# Patient Record
Sex: Female | Born: 1945
Health system: Southern US, Community
[De-identification: ages and names within clinical notes are randomized; demographics above are authoritative.]

## PROBLEM LIST (undated history)

## (undated) DIAGNOSIS — F329 Major depressive disorder, single episode, unspecified: Secondary | ICD-10-CM

## (undated) DIAGNOSIS — B029 Zoster without complications: Secondary | ICD-10-CM

## (undated) DIAGNOSIS — D649 Anemia, unspecified: Secondary | ICD-10-CM

## (undated) DIAGNOSIS — F32A Depression, unspecified: Secondary | ICD-10-CM

## (undated) HISTORY — DX: Anemia, unspecified: D64.9

## (undated) HISTORY — DX: Depression, unspecified: F32.A

## (undated) HISTORY — PX: TONSILLECTOMY: SUR1361

## (undated) HISTORY — DX: Major depressive disorder, single episode, unspecified: F32.9

## (undated) HISTORY — PX: ABDOMINAL HYSTERECTOMY: SHX81

---

## 2000-02-27 ENCOUNTER — Other Ambulatory Visit: Admission: RE | Admit: 2000-02-27 | Discharge: 2000-02-27 | Payer: Self-pay | Admitting: *Deleted

## 2006-07-03 ENCOUNTER — Encounter: Admission: RE | Admit: 2006-07-03 | Discharge: 2006-07-03 | Payer: Self-pay | Admitting: Geriatric Medicine

## 2006-07-03 IMAGING — RF DG UGI W/ HIGH DENSITY W/KUB
11 series · 11 of 11 positions shown · non-contrast
Comparison: none

CLINICAL DATA: Iron deficiency anemia.  
 UPPER GI HIGH DENSITY WITH KUB:
 Preliminary scout film reveals unremarkable bowel gas pattern.  There is a generous amount of stool in the colon.  Small hiatal hernia.  Marked GE reflux.  Normal esophageal motility.  Somewhat prominent diffuse area gastrica pattern of the stomach is likely within normal limits.  No mucosal fold thickening, ulcer, or mass.  Normal duodenum.

[Series 1: run · 1 of 1 slices shown (1 of 10)]
[im 1/1]
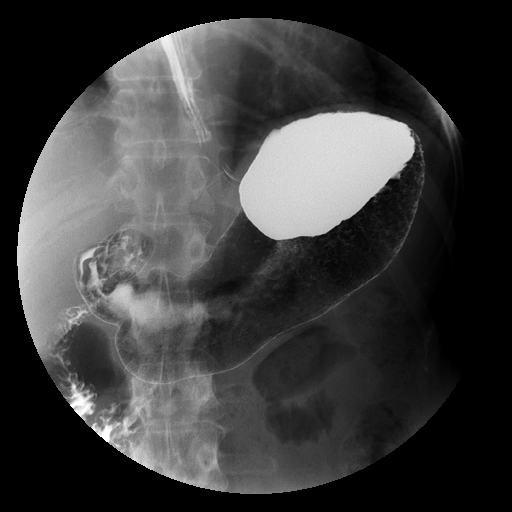

[Series 2: run · 1 of 1 slices shown (2 of 10)]
[im 1/1]
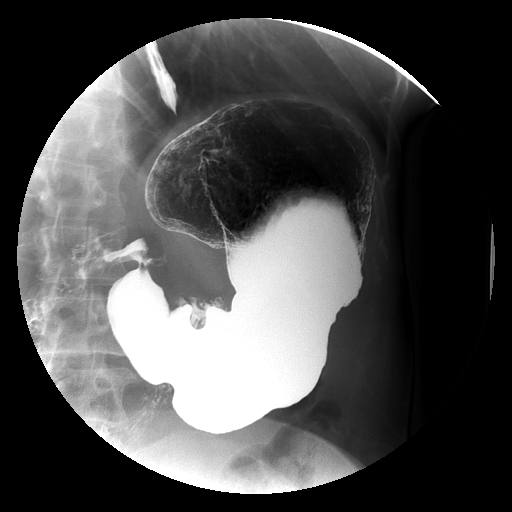

[Series 3: run · 1 of 1 slices shown (3 of 10)]
[im 1/1]
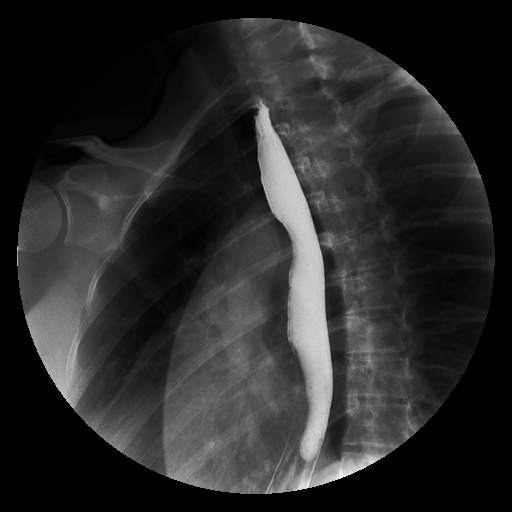

[Series 5: run · 1 of 1 slices shown (4 of 10)]
[im 1/1]
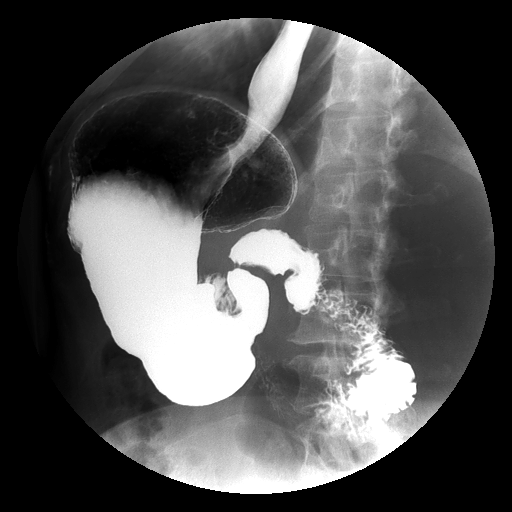

[Series 7: run · 1 of 1 slices shown (5 of 10)]
[im 1/1]
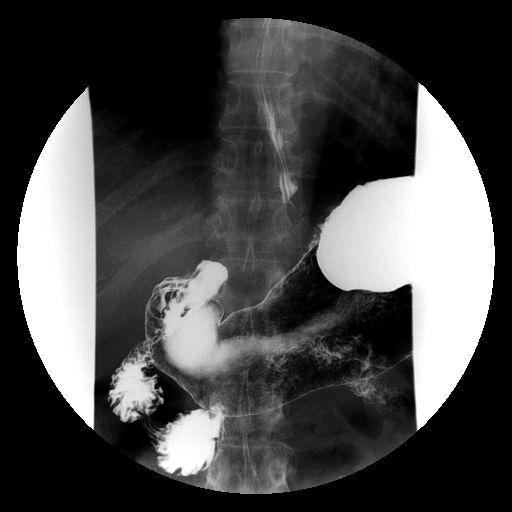

[Series 8: run · 1 of 1 slices shown (6 of 10)]
[im 1/1]
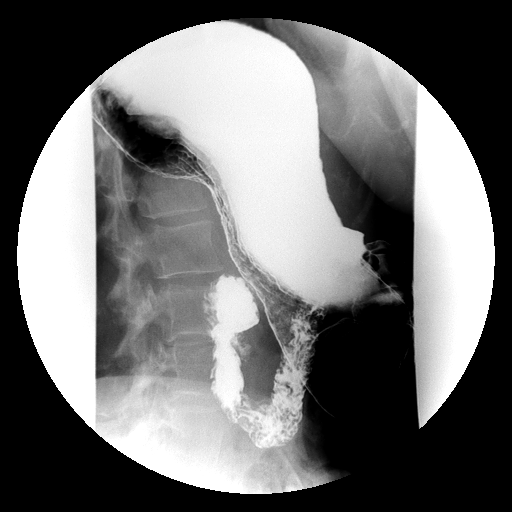

[Series 9: run · 1 of 1 slices shown (7 of 10)]
[im 1/1]
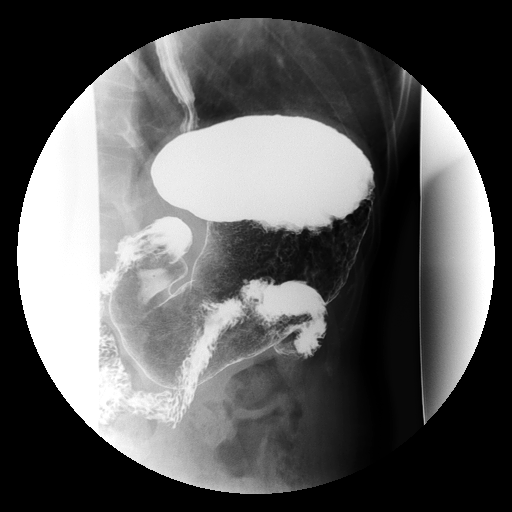

[Series 10: run · 1 of 1 slices shown (8 of 10)]
[im 1/1]
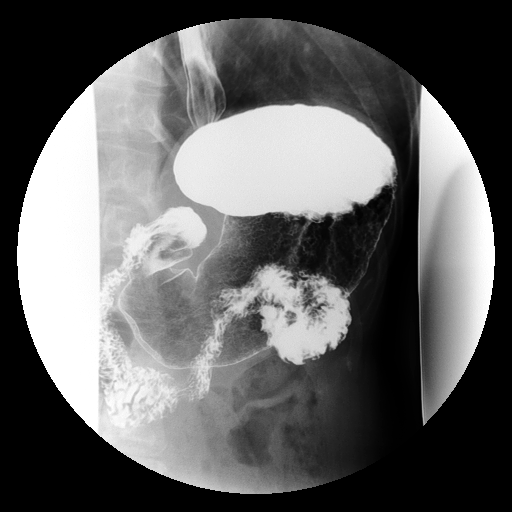

[Series 11: run · 1 of 1 slices shown (9 of 10)]
[im 1/1]
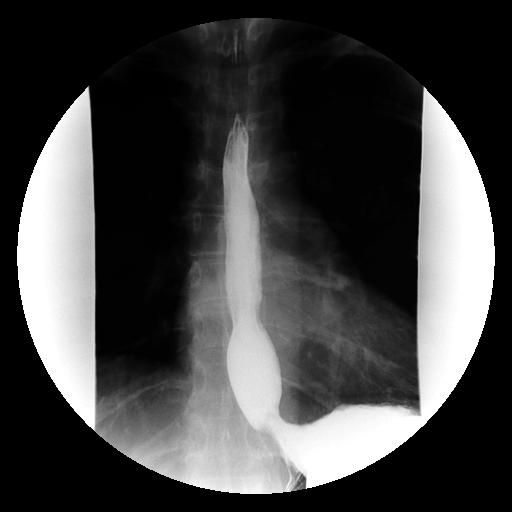

[Series 12: run · 1 of 1 slices shown (10 of 10)]
[im 1/1]
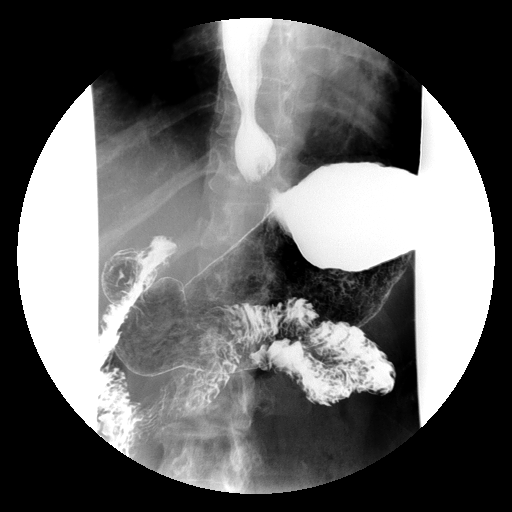

[Series 1001: view not recorded · 0.20mm/px · 1 of 1 slices shown]
[im 1/1]
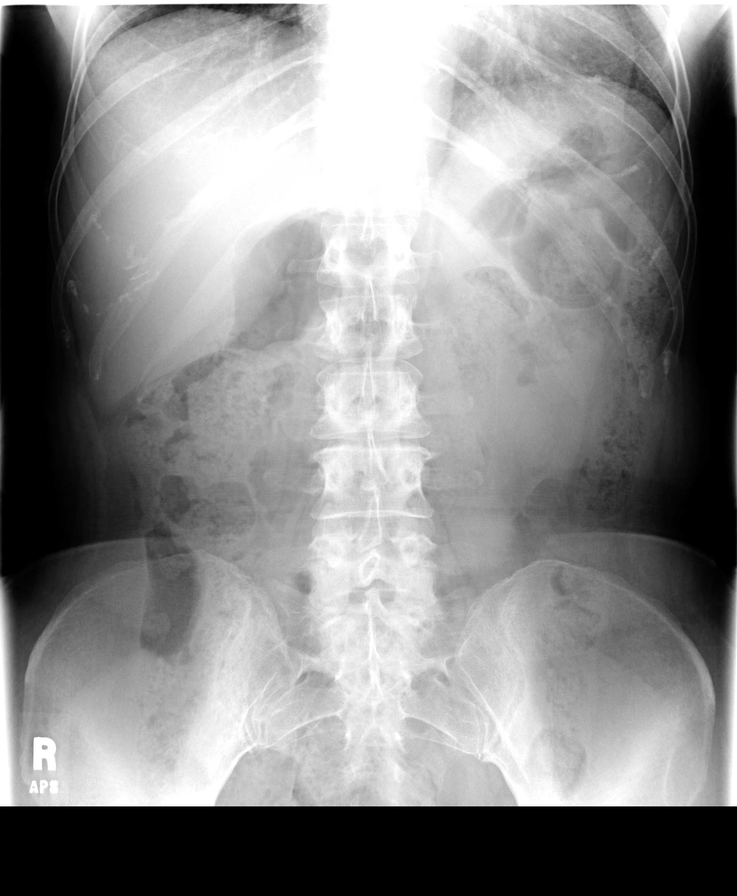

[11 of 11 positions shown; findings below may reference images not displayed]

IMPRESSION: Small hiatal hernia and marked GE reflux.  I discussed the findings with the patient.

## 2011-05-02 DIAGNOSIS — H5710 Ocular pain, unspecified eye: Secondary | ICD-10-CM | POA: Diagnosis not present

## 2011-05-02 DIAGNOSIS — H04129 Dry eye syndrome of unspecified lacrimal gland: Secondary | ICD-10-CM | POA: Diagnosis not present

## 2011-05-26 DIAGNOSIS — F329 Major depressive disorder, single episode, unspecified: Secondary | ICD-10-CM | POA: Diagnosis not present

## 2011-05-26 DIAGNOSIS — Z0000 Encounter for general adult medical examination without abnormal findings: Secondary | ICD-10-CM | POA: Diagnosis not present

## 2011-05-26 DIAGNOSIS — D509 Iron deficiency anemia, unspecified: Secondary | ICD-10-CM | POA: Diagnosis not present

## 2011-05-26 DIAGNOSIS — Z79899 Other long term (current) drug therapy: Secondary | ICD-10-CM | POA: Diagnosis not present

## 2011-05-26 DIAGNOSIS — J309 Allergic rhinitis, unspecified: Secondary | ICD-10-CM | POA: Diagnosis not present

## 2011-05-26 DIAGNOSIS — Z23 Encounter for immunization: Secondary | ICD-10-CM | POA: Diagnosis not present

## 2011-05-27 DIAGNOSIS — Z79899 Other long term (current) drug therapy: Secondary | ICD-10-CM | POA: Diagnosis not present

## 2011-05-27 DIAGNOSIS — D509 Iron deficiency anemia, unspecified: Secondary | ICD-10-CM | POA: Diagnosis not present

## 2011-06-02 DIAGNOSIS — L821 Other seborrheic keratosis: Secondary | ICD-10-CM | POA: Diagnosis not present

## 2011-06-02 DIAGNOSIS — D239 Other benign neoplasm of skin, unspecified: Secondary | ICD-10-CM | POA: Diagnosis not present

## 2011-06-02 DIAGNOSIS — D485 Neoplasm of uncertain behavior of skin: Secondary | ICD-10-CM | POA: Diagnosis not present

## 2011-06-24 DIAGNOSIS — D485 Neoplasm of uncertain behavior of skin: Secondary | ICD-10-CM | POA: Diagnosis not present

## 2011-11-24 DIAGNOSIS — F329 Major depressive disorder, single episode, unspecified: Secondary | ICD-10-CM | POA: Diagnosis not present

## 2011-11-24 DIAGNOSIS — D509 Iron deficiency anemia, unspecified: Secondary | ICD-10-CM | POA: Diagnosis not present

## 2011-11-24 DIAGNOSIS — Z79899 Other long term (current) drug therapy: Secondary | ICD-10-CM | POA: Diagnosis not present

## 2012-01-05 DIAGNOSIS — Z1231 Encounter for screening mammogram for malignant neoplasm of breast: Secondary | ICD-10-CM | POA: Diagnosis not present

## 2012-01-06 DIAGNOSIS — M549 Dorsalgia, unspecified: Secondary | ICD-10-CM | POA: Diagnosis not present

## 2012-01-06 DIAGNOSIS — M25559 Pain in unspecified hip: Secondary | ICD-10-CM | POA: Diagnosis not present

## 2012-01-07 DIAGNOSIS — N6489 Other specified disorders of breast: Secondary | ICD-10-CM | POA: Diagnosis not present

## 2012-01-13 DIAGNOSIS — D1801 Hemangioma of skin and subcutaneous tissue: Secondary | ICD-10-CM | POA: Diagnosis not present

## 2012-01-13 DIAGNOSIS — D2370 Other benign neoplasm of skin of unspecified lower limb, including hip: Secondary | ICD-10-CM | POA: Diagnosis not present

## 2012-01-13 DIAGNOSIS — D235 Other benign neoplasm of skin of trunk: Secondary | ICD-10-CM | POA: Diagnosis not present

## 2012-01-13 DIAGNOSIS — Z85828 Personal history of other malignant neoplasm of skin: Secondary | ICD-10-CM | POA: Diagnosis not present

## 2012-01-15 DIAGNOSIS — Z23 Encounter for immunization: Secondary | ICD-10-CM | POA: Diagnosis not present

## 2012-01-27 ENCOUNTER — Other Ambulatory Visit: Payer: Self-pay | Admitting: Geriatric Medicine

## 2012-01-27 DIAGNOSIS — R51 Headache: Secondary | ICD-10-CM | POA: Diagnosis not present

## 2012-01-28 ENCOUNTER — Other Ambulatory Visit: Payer: Self-pay

## 2012-01-28 ENCOUNTER — Ambulatory Visit
Admission: RE | Admit: 2012-01-28 | Discharge: 2012-01-28 | Disposition: A | Discharge status: Home / Self Care | Payer: Medicare Other | Source: Ambulatory Visit | Attending: Geriatric Medicine | Admitting: Geriatric Medicine

## 2012-01-28 DIAGNOSIS — R51 Headache: Secondary | ICD-10-CM | POA: Diagnosis not present

## 2012-01-28 DIAGNOSIS — R42 Dizziness and giddiness: Secondary | ICD-10-CM | POA: Diagnosis not present

## 2012-01-28 IMAGING — CT CT HEAD W/O CM
2 series · 16 of 30 positions shown, 18 images · non-contrast
Comparison: None

CLINICAL DATA: Headache, nausea, dizziness.  Fall 6 weeks ago.

CT HEAD WITHOUT CONTRAST
TECHNIQUE: Contiguous axial images were obtained from the base of
the skull through the vertex without contrast.

[Series 2: head w/o · axial · non-contrast · 0.49mm/px · z∈[+60,+171]mm · 8 of 28 slices shown, 10 images]
[im 4/28  brain]
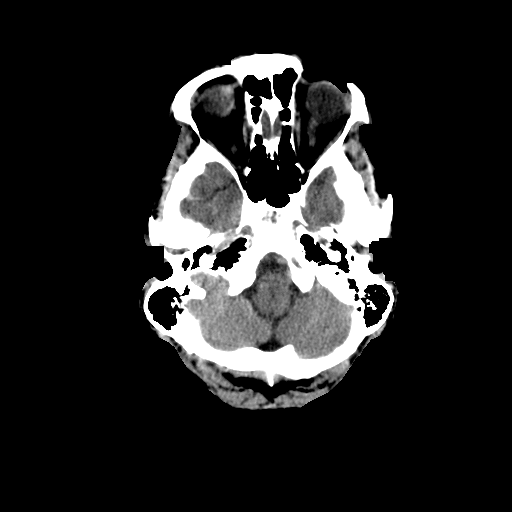
[im 4/28  bone]
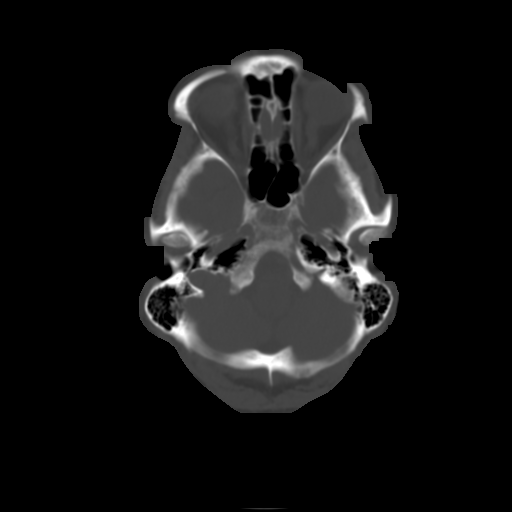
[im 7/28  brain]
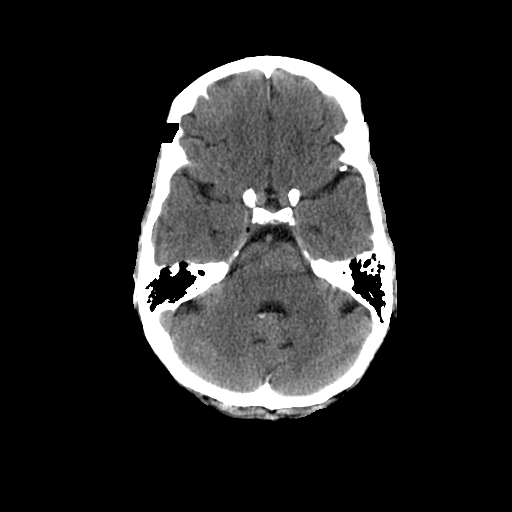
[im 10/28  brain]
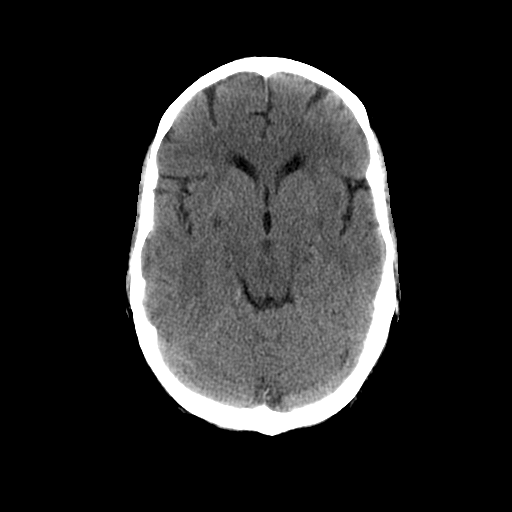
[im 13/28  brain]
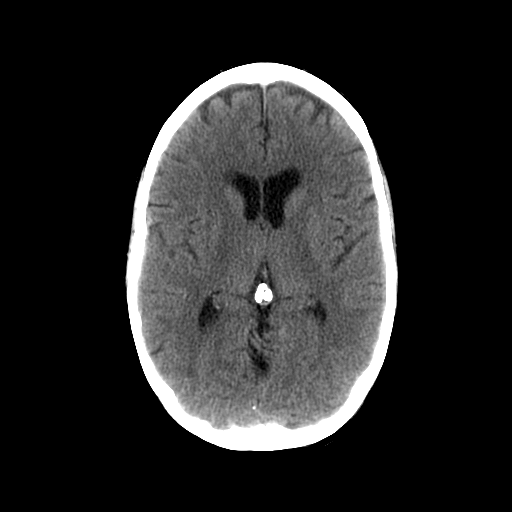
[im 16/28  brain]
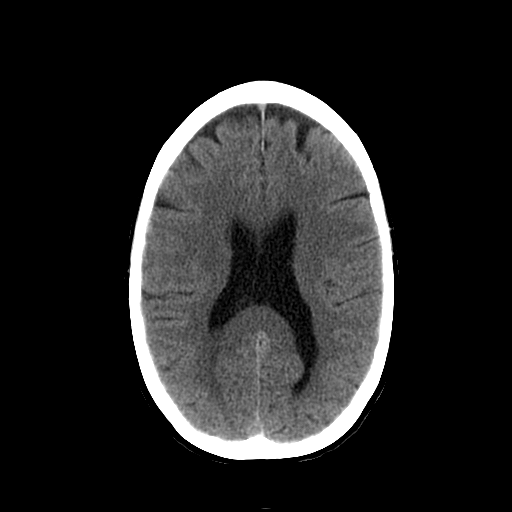
[im 16/28  bone]
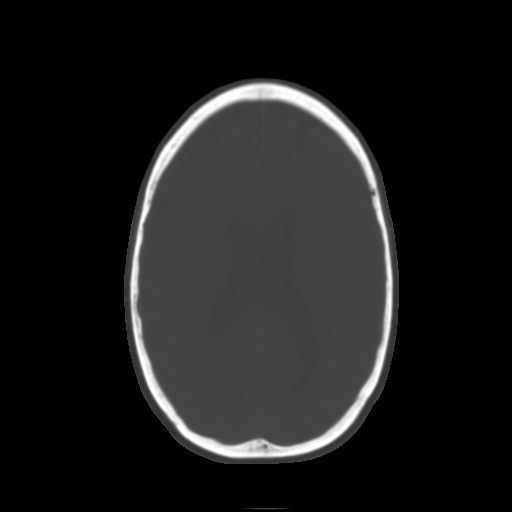
[im 19/28  brain]
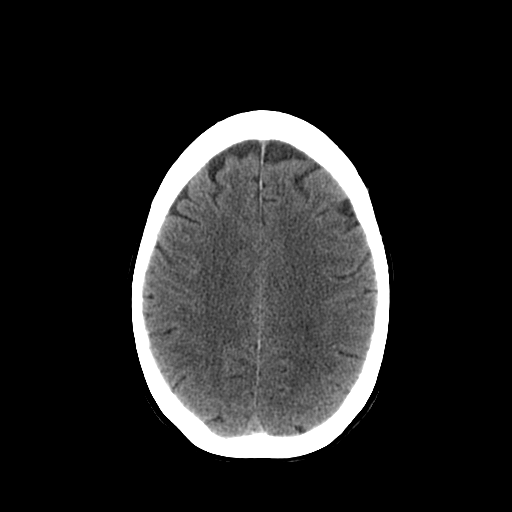
[im 22/28  brain]
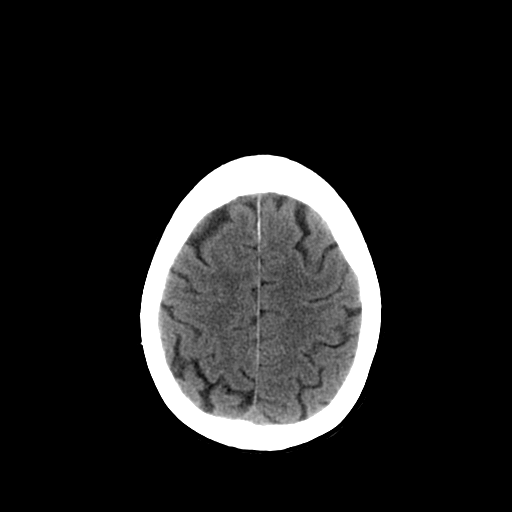
[im 25/28  brain]
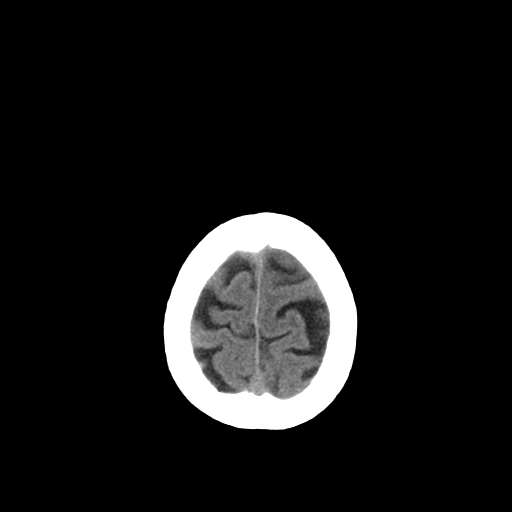

[Series 3: head bone · axial · 0.49mm/px · z∈[+56,+172]mm · 8 of 56 slices shown]
[im 6/56  bone]
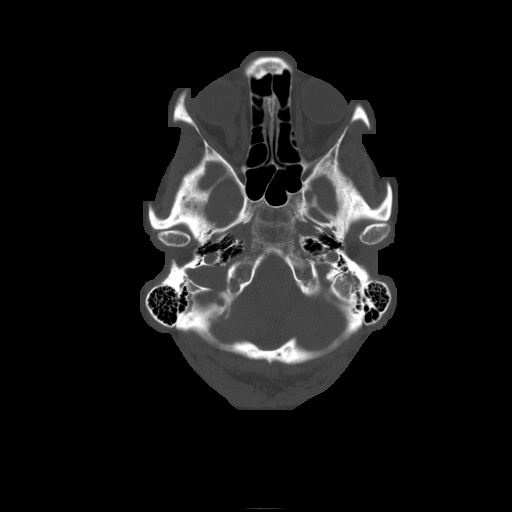
[im 12/56  bone]
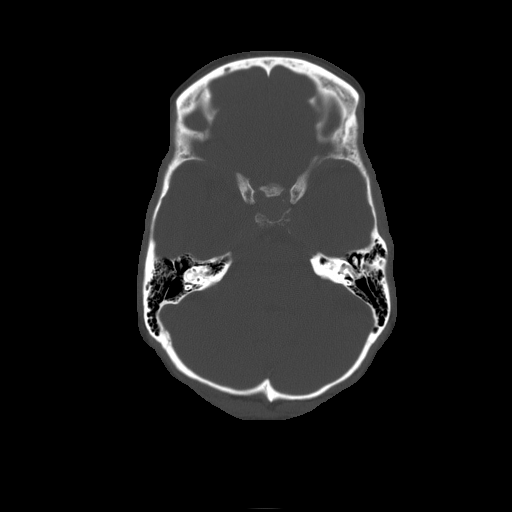
[im 18/56  bone]
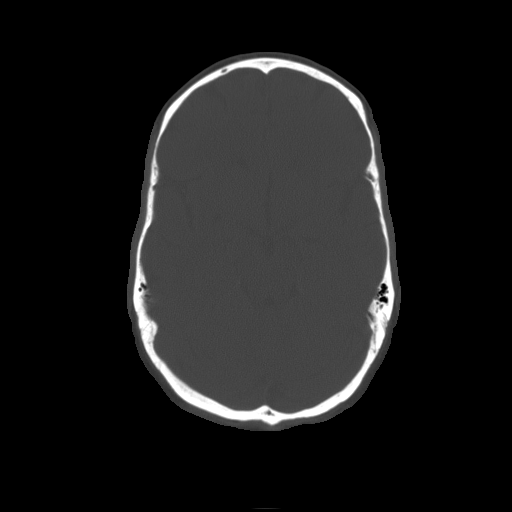
[im 24/56  bone]
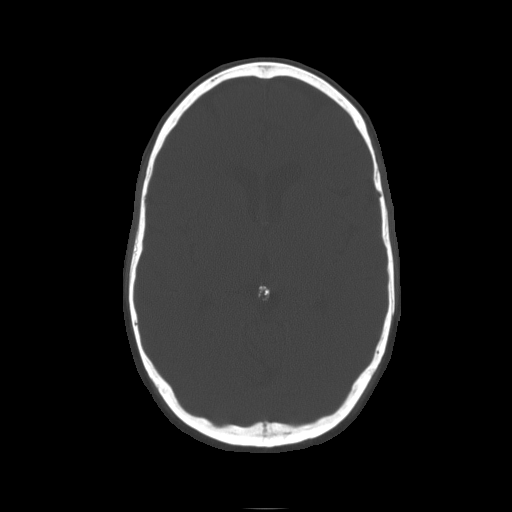
[im 32/56  bone]
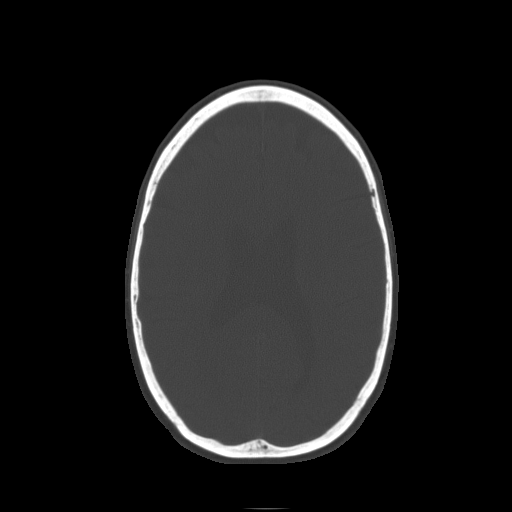
[im 38/56  bone]
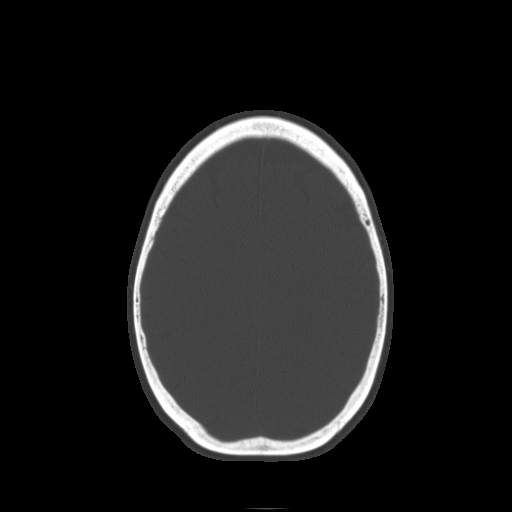
[im 44/56  bone]
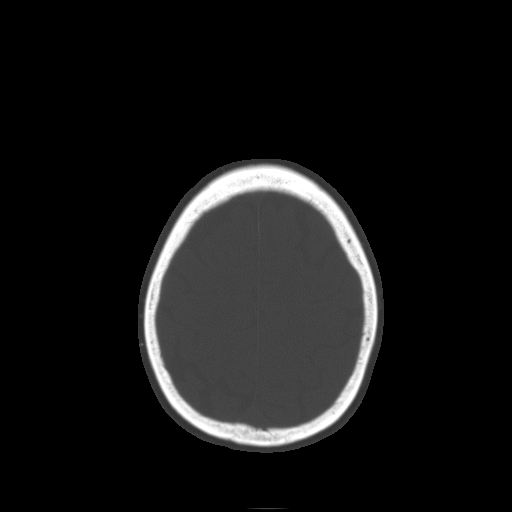
[im 50/56  bone]
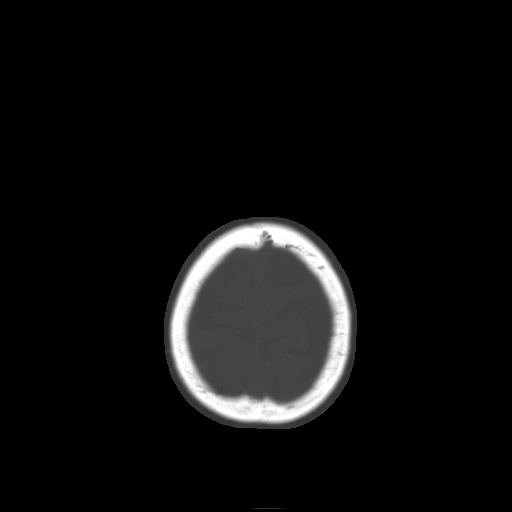

[16 of 30 positions shown; findings below may reference images not displayed]

FINDINGS: Normal brain volume for age.  No acute infarct.  Mild
chronic microvascular ischemia.  Negative for hemorrhage or mass.
No edema or midline shift.  Calvarium is intact.
IMPRESSION: No acute abnormality.

## 2012-05-19 DIAGNOSIS — H04129 Dry eye syndrome of unspecified lacrimal gland: Secondary | ICD-10-CM | POA: Diagnosis not present

## 2012-05-19 DIAGNOSIS — H16109 Unspecified superficial keratitis, unspecified eye: Secondary | ICD-10-CM | POA: Diagnosis not present

## 2012-05-19 DIAGNOSIS — H5210 Myopia, unspecified eye: Secondary | ICD-10-CM | POA: Diagnosis not present

## 2012-05-19 DIAGNOSIS — H01009 Unspecified blepharitis unspecified eye, unspecified eyelid: Secondary | ICD-10-CM | POA: Diagnosis not present

## 2012-05-26 DIAGNOSIS — Z0000 Encounter for general adult medical examination without abnormal findings: Secondary | ICD-10-CM | POA: Diagnosis not present

## 2012-05-26 DIAGNOSIS — M549 Dorsalgia, unspecified: Secondary | ICD-10-CM | POA: Diagnosis not present

## 2012-05-26 DIAGNOSIS — D509 Iron deficiency anemia, unspecified: Secondary | ICD-10-CM | POA: Diagnosis not present

## 2012-05-26 DIAGNOSIS — Z136 Encounter for screening for cardiovascular disorders: Secondary | ICD-10-CM | POA: Diagnosis not present

## 2012-05-26 DIAGNOSIS — Z79899 Other long term (current) drug therapy: Secondary | ICD-10-CM | POA: Diagnosis not present

## 2012-07-08 DIAGNOSIS — Z09 Encounter for follow-up examination after completed treatment for conditions other than malignant neoplasm: Secondary | ICD-10-CM | POA: Diagnosis not present

## 2012-09-17 DIAGNOSIS — H04129 Dry eye syndrome of unspecified lacrimal gland: Secondary | ICD-10-CM | POA: Diagnosis not present

## 2012-09-17 DIAGNOSIS — H16109 Unspecified superficial keratitis, unspecified eye: Secondary | ICD-10-CM | POA: Diagnosis not present

## 2013-01-05 DIAGNOSIS — D249 Benign neoplasm of unspecified breast: Secondary | ICD-10-CM | POA: Diagnosis not present

## 2013-01-05 DIAGNOSIS — Z09 Encounter for follow-up examination after completed treatment for conditions other than malignant neoplasm: Secondary | ICD-10-CM | POA: Diagnosis not present

## 2013-01-13 DIAGNOSIS — Z23 Encounter for immunization: Secondary | ICD-10-CM | POA: Diagnosis not present

## 2013-01-13 DIAGNOSIS — D1801 Hemangioma of skin and subcutaneous tissue: Secondary | ICD-10-CM | POA: Diagnosis not present

## 2013-01-13 DIAGNOSIS — L259 Unspecified contact dermatitis, unspecified cause: Secondary | ICD-10-CM | POA: Diagnosis not present

## 2013-01-13 DIAGNOSIS — D2370 Other benign neoplasm of skin of unspecified lower limb, including hip: Secondary | ICD-10-CM | POA: Diagnosis not present

## 2013-01-13 DIAGNOSIS — L821 Other seborrheic keratosis: Secondary | ICD-10-CM | POA: Diagnosis not present

## 2013-04-25 DIAGNOSIS — H16109 Unspecified superficial keratitis, unspecified eye: Secondary | ICD-10-CM | POA: Diagnosis not present

## 2013-04-25 DIAGNOSIS — H2510 Age-related nuclear cataract, unspecified eye: Secondary | ICD-10-CM | POA: Diagnosis not present

## 2013-04-25 DIAGNOSIS — H01009 Unspecified blepharitis unspecified eye, unspecified eyelid: Secondary | ICD-10-CM | POA: Diagnosis not present

## 2013-04-25 DIAGNOSIS — H04129 Dry eye syndrome of unspecified lacrimal gland: Secondary | ICD-10-CM | POA: Diagnosis not present

## 2013-05-20 DIAGNOSIS — R197 Diarrhea, unspecified: Secondary | ICD-10-CM | POA: Diagnosis not present

## 2013-05-24 DIAGNOSIS — R198 Other specified symptoms and signs involving the digestive system and abdomen: Secondary | ICD-10-CM | POA: Diagnosis not present

## 2013-05-24 DIAGNOSIS — K591 Functional diarrhea: Secondary | ICD-10-CM | POA: Diagnosis not present

## 2013-05-27 DIAGNOSIS — R197 Diarrhea, unspecified: Secondary | ICD-10-CM | POA: Diagnosis not present

## 2013-05-27 DIAGNOSIS — D126 Benign neoplasm of colon, unspecified: Secondary | ICD-10-CM | POA: Diagnosis not present

## 2013-06-01 DIAGNOSIS — D509 Iron deficiency anemia, unspecified: Secondary | ICD-10-CM | POA: Diagnosis not present

## 2013-06-01 DIAGNOSIS — R5381 Other malaise: Secondary | ICD-10-CM | POA: Diagnosis not present

## 2013-06-01 DIAGNOSIS — Z0000 Encounter for general adult medical examination without abnormal findings: Secondary | ICD-10-CM | POA: Diagnosis not present

## 2013-06-01 DIAGNOSIS — Z1331 Encounter for screening for depression: Secondary | ICD-10-CM | POA: Diagnosis not present

## 2013-06-01 DIAGNOSIS — R197 Diarrhea, unspecified: Secondary | ICD-10-CM | POA: Diagnosis not present

## 2013-07-10 ENCOUNTER — Ambulatory Visit (INDEPENDENT_AMBULATORY_CARE_PROVIDER_SITE_OTHER): Payer: Medicare Other | Admitting: Family Medicine

## 2013-07-10 VITALS — BP 140/80 | HR 73 | Temp 97.8°F | Resp 18 | Wt 129.0 lb

## 2013-07-10 DIAGNOSIS — J040 Acute laryngitis: Secondary | ICD-10-CM | POA: Diagnosis not present

## 2013-07-10 DIAGNOSIS — J209 Acute bronchitis, unspecified: Secondary | ICD-10-CM | POA: Diagnosis not present

## 2013-07-10 MED ORDER — AZITHROMYCIN 250 MG PO TABS: ORAL_TABLET | ORAL | Status: DC

## 2013-07-10 MED ORDER — HYDROCODONE-HOMATROPINE 5-1.5 MG/5ML PO SYRP: 5.0000 mL | ORAL_SOLUTION | Freq: Three times a day (TID) | ORAL | Status: DC | PRN

## 2013-07-10 NOTE — Patient Instructions (Signed)

## 2013-07-10 NOTE — Progress Notes (Signed)
This chart was scribed for Sara Haber, MD by Roxan Diesel, ED scribe.  This patient was seen in [facility] and the patient's care was started at 8:28 AM.  @[facility]OGO @  Patient ID: NYRAH DEMOS MRN: [id number], DOB: Jan 23, 1946, 68 y.o. Date of Encounter: 07/10/2013, 8:29 AM  Primary Physician: Mathews Argyle, MD  Chief Complaint: Laryngitis  HPI: 68 y.o. year old female with history below presents with a 6-day history of laryngitis.  Pt reports persistent cough, congestion, and progressively-worsening voice loss.  She reports some chest pain only on coughing.  She also notes some SOB during her coughing.  She denies fever.  Pt was not able to sleep at all last night.  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   Past Medical History  Diagnosis Date   Anemia    Depression      Home Meds: Prior to Admission medications   Medication Sig Start Date End Date Taking? Authorizing Provider  calcium citrate (CALCITRATE - DOSED IN MG ELEMENTAL CALCIUM) 950 MG tablet Take 200 mg of elemental calcium by mouth daily.   Yes Historical Provider, MD  sertraline (ZOLOFT) 100 MG tablet Take 100 mg by mouth daily.   Yes Historical Provider, MD  vitamin C (ASCORBIC ACID) 500 MG tablet Take 500 mg by mouth daily.   Yes Historical Provider, MD    Allergies:  Allergies  Allergen Reactions   Amoxicillin    Levaquin [Levofloxacin In D5w]     History   Social History   Marital Status: Married    Spouse Name: N/A    Number of Children: N/A   Years of Education: N/A   Occupational History   Not on file.   Social History Main Topics   Smoking status: Never Smoker    Smokeless tobacco: Not on file   Alcohol Use: Yes   Drug Use: No   Sexual Activity: Not on file   Other Topics Concern   Not on file   Social History Narrative   No narrative on file     Review of  Systems: Constitutional: negative for chills, fever, night sweats, weight changes, or fatigue  HEENT: positive for congestion. Negative for vision changes, hearing loss, ST, epistaxis, or sinus pressure Cardiovascular: positive for chest pain on coughing. negative for palpitations Respiratory: positive for cough and shortness of breath on coughing. negative for hemoptysis, wheezing,  Abdominal: negative for abdominal pain, nausea, vomiting, diarrhea, or constipation Dermatological: negative for rash Neurologic: negative for headache, dizziness, or syncope All other systems reviewed and are otherwise negative with the exception to those above and in the HPI.   Physical Exam: Blood pressure 140/80, pulse 73, temperature 97.8 F (36.6 C), temperature source Oral, resp. rate 18, weight 129 lb (58.514 kg), SpO2 98.00%., There is no height on file to calculate BMI. General: Well developed, well nourished, in no acute distress. Head: Normocephalic, atraumatic, eyes without discharge, sclera non-icteric, nares are without discharge. Bilateral auditory canals clear, TM's are without perforation, pearly grey and translucent with reflective cone of light bilaterally. Oral cavity moist, posterior pharynx without exudate, erythema, peritonsillar abscess, or post nasal drip.  Neck: Supple. No thyromegaly. Full ROM. No lymphadenopathy. Lungs: Few rales in right base.  No wheezes or rhonchi. Breathing is unlabored.   Heart: RRR with S1 S2. No murmurs, rubs, or gallops appreciated. Abdomen: Soft, non-tender, non-distended with normoactive bowel sounds. No hepatomegaly. No rebound/guarding.  No obvious abdominal masses. Msk:  Strength and tone normal for age. Extremities/Skin: Warm and dry. No clubbing or cyanosis. No edema. No rashes or suspicious lesions. Neuro: Alert and oriented X 3. Moves all extremities spontaneously. Gait is normal. CNII-XII grossly in tact. Psych:  Responds to questions appropriately  with a normal affect.   Labs:   ASSESSMENT AND PLAN:  68 y.o. year old female with   1. Laryngitis   2. Acute bronchitis    Laryngitis - Plan: HYDROcodone-homatropine (HYCODAN) 5-1.5 MG/5ML syrup, azithromycin (ZITHROMAX Z-PAK) 250 MG tablet  Acute bronchitis - Plan: HYDROcodone-homatropine (HYCODAN) 5-1.5 MG/5ML syrup, azithromycin (ZITHROMAX Z-PAK) 250 MG tablet   Signed, Sara Haber, MD 07/10/2013 8:29 AM

## 2014-01-12 DIAGNOSIS — L819 Disorder of pigmentation, unspecified: Secondary | ICD-10-CM | POA: Diagnosis not present

## 2014-01-12 DIAGNOSIS — D2360 Other benign neoplasm of skin of unspecified upper limb, including shoulder: Secondary | ICD-10-CM | POA: Diagnosis not present

## 2014-01-12 DIAGNOSIS — L821 Other seborrheic keratosis: Secondary | ICD-10-CM | POA: Diagnosis not present

## 2014-01-12 DIAGNOSIS — D1801 Hemangioma of skin and subcutaneous tissue: Secondary | ICD-10-CM | POA: Diagnosis not present

## 2014-01-12 DIAGNOSIS — D2370 Other benign neoplasm of skin of unspecified lower limb, including hip: Secondary | ICD-10-CM | POA: Diagnosis not present

## 2014-01-12 DIAGNOSIS — D235 Other benign neoplasm of skin of trunk: Secondary | ICD-10-CM | POA: Diagnosis not present

## 2014-01-16 DIAGNOSIS — Z1231 Encounter for screening mammogram for malignant neoplasm of breast: Secondary | ICD-10-CM | POA: Diagnosis not present

## 2014-01-17 ENCOUNTER — Ambulatory Visit (INDEPENDENT_AMBULATORY_CARE_PROVIDER_SITE_OTHER): Payer: Medicare Other | Admitting: Family Medicine

## 2014-01-17 ENCOUNTER — Encounter: Payer: Self-pay | Admitting: Family Medicine

## 2014-01-17 VITALS — BP 148/78 | HR 74 | Temp 97.3°F | Resp 16 | Ht 62.75 in | Wt 128.0 lb

## 2014-01-17 DIAGNOSIS — B029 Zoster without complications: Secondary | ICD-10-CM

## 2014-01-17 DIAGNOSIS — R0789 Other chest pain: Secondary | ICD-10-CM

## 2014-01-17 DIAGNOSIS — R071 Chest pain on breathing: Secondary | ICD-10-CM

## 2014-01-17 MED ORDER — VALACYCLOVIR HCL 1 G PO TABS: 1000.0000 mg | ORAL_TABLET | Freq: Three times a day (TID) | ORAL | Status: DC

## 2014-01-17 MED ORDER — HYDROCODONE-ACETAMINOPHEN 5-325 MG PO TABS: 1.0000 | ORAL_TABLET | Freq: Four times a day (QID) | ORAL | Status: DC | PRN

## 2014-01-17 NOTE — Patient Instructions (Signed)
Discussed [email or web address] for information as CHL down at time of discharge.

## 2014-01-17 NOTE — Progress Notes (Addendum)
Subjective:  This chart was scribed for Merri Ray, MD by Donato Schultz, Medical Scribe. This patient was seen in Room 1 and the patient's care was started at 10:05 AM.   Patient ID: Sara Carson, female    DOB: 11-Jan-1946, 69 y.o.   MRN: [id number]  HPI HPI Comments: Sara Carson is a 68 y.o. female who presents to the [facility] complaining of constant, burning chest pain that started 2 days ago that she suspects may be attributed to shingles.  She states that the pain felt reminiscent of skin pain yesterday and she noticed a rash on the left side of her chest extending around to her left flank.  There is no involvement of her right trunk.Deep breathing aggravates the pain.  She denies SOB as an associated symptom.  She does not have any history of pain medication use and will take Ibuprofen for pain intermittently.  She has not received the shingles vaccine.  She has not history of heart problems.  There are no active problems to display for this patient.  Past Medical History  Diagnosis Date  . Anemia   . Depression    Past Surgical History  Procedure Laterality Date  . Abdominal hysterectomy     Allergies  Allergen Reactions  . Amoxicillin   . Levaquin [Levofloxacin In D5w]    Prior to Admission medications   Medication Sig Start Date End Date Taking? Authorizing Provider  azithromycin (ZITHROMAX Z-PAK) 250 MG tablet Take as directed on pack 07/10/13   Robyn Haber, MD  calcium citrate (CALCITRATE - DOSED IN MG ELEMENTAL CALCIUM) 950 MG tablet Take 200 mg of elemental calcium by mouth daily.    Historical Provider, MD  HYDROcodone-acetaminophen (NORCO/VICODIN) 5-325 MG per tablet Take 1 tablet by mouth every 6 (six) hours as needed for moderate pain. (paper rx provided when CHL down) 01/17/14   Wendie Agreste, MD  HYDROcodone-homatropine [facility]) 5-1.5 MG/5ML syrup Take 5 mLs by mouth every 8 (eight) hours as needed for cough. 07/10/13   Robyn Haber, MD  sertraline (ZOLOFT) 100 MG tablet Take 100 mg by mouth daily.    Historical Provider, MD  valACYclovir (VALTREX) 1000 MG tablet Take 1 tablet (1,000 mg total) by mouth 3 (three) times daily. Paper rx given when CHL down 01/17/14   Wendie Agreste, MD  vitamin C (ASCORBIC ACID) 500 MG tablet Take 500 mg by mouth daily.    Historical Provider, MD   History   Social History  . Marital Status: Married    Spouse Name: N/A    Number of Children: N/A  . Years of Education: N/A   Occupational History  . Not on file.   Social History Main Topics  . Smoking status: Never Smoker   . Smokeless tobacco: Not on file  . Alcohol Use: Yes  . Drug Use: No  . Sexual Activity: Not on file   Other Topics Concern  . Not on file   Social History Narrative  . No narrative on file       Review of Systems  Respiratory: Negative for shortness of breath.   Skin: Positive for rash.       Objective:   Physical Exam  Vitals reviewed. Constitutional: She is oriented to person, place, and time. She appears well-developed and well-nourished.  HENT:  Head: Normocephalic and atraumatic.  Eyes: Conjunctivae and EOM are normal. Pupils are equal, round, and reactive to light.  Neck: Carotid bruit  is not present.  Cardiovascular: Normal rate, regular rhythm, normal heart sounds and intact distal pulses.   Pulmonary/Chest: Effort normal and breath sounds normal. No respiratory distress. She has no wheezes. She has no rales.  Abdominal: Soft. She exhibits no pulsatile midline mass. There is no tenderness.  Neurological: She is alert and oriented to person, place, and time.  Skin: Skin is warm and dry. Rash noted.  Multiple small patches to upper mid back with central vesicles. Similar rash in left axilla and upper left chest wall.  Tender along palpation of skin.  No deep tenderness to palpation.  No discharge from rash.  No involvement of right trunk.  Psychiatric: She has a normal mood  and affect. Her behavior is normal.     Filed Vitals:   01/17/14 1031  BP: 148/78  Pulse: 74  Temp: 97.3 F (36.3 C)  TempSrc: Oral  Resp: 16  Height: 5' 2.75" (1.594 m)  Weight: 128 lb (58.06 kg)  SpO2: 100%   Assessment & Plan:   Sara Carson is a 68 y.o. female Chest wall pain - Plan: HYDROcodone-acetaminophen (NORCO/VICODIN) 5-325 MG per tablet  Shingles - Plan: HYDROcodone-acetaminophen (NORCO/VICODIN) 5-325 MG per tablet, valACYclovir (VALTREX) 1000 MG tablet  Suspected early Zoster. Start valtrex, lortab if needed - SED, and rtc precautions discussed - especially if any change in chest pain, as reproducible on chest wall today. Delay in zostavax discussed from current illness.   Borderline BP likely d/t pain form shingles. Can recheck with PCP next ov.    Meds ordered this encounter  Medications  . HYDROcodone-acetaminophen (NORCO/VICODIN) 5-325 MG per tablet    Sig: Take 1 tablet by mouth every 6 (six) hours as needed for moderate pain. (paper rx provided when CHL down)    Dispense:  30 tablet    Refill:  0  . valACYclovir (VALTREX) 1000 MG tablet    Sig: Take 1 tablet (1,000 mg total) by mouth 3 (three) times daily. Paper rx given when CHL down    Dispense:  21 tablet    Refill:  0   Patient Instructions  Discussed [email or web address] for information as CHL down at time of discharge.   I personally performed the services described in this documentation, which was scribed in my presence. The recorded information has been reviewed and considered, and addended by me as needed.

## 2014-01-29 ENCOUNTER — Other Ambulatory Visit: Payer: Self-pay | Admitting: Family Medicine

## 2014-01-29 ENCOUNTER — Telehealth: Payer: Self-pay

## 2014-01-29 ENCOUNTER — Ambulatory Visit (INDEPENDENT_AMBULATORY_CARE_PROVIDER_SITE_OTHER): Payer: Medicare Other | Admitting: Internal Medicine

## 2014-01-29 VITALS — BP 130/72 | HR 100 | Temp 97.3°F | Resp 18 | Ht 62.25 in | Wt 123.8 lb

## 2014-01-29 DIAGNOSIS — B0229 Other postherpetic nervous system involvement: Secondary | ICD-10-CM

## 2014-01-29 DIAGNOSIS — B029 Zoster without complications: Secondary | ICD-10-CM | POA: Diagnosis not present

## 2014-01-29 MED ORDER — LIDOCAINE 5 % EX PTCH: MEDICATED_PATCH | CUTANEOUS | Status: DC

## 2014-01-29 MED ORDER — TRAMADOL HCL 50 MG PO TABS: 50.0000 mg | ORAL_TABLET | Freq: Three times a day (TID) | ORAL | Status: DC | PRN

## 2014-01-29 MED ORDER — GABAPENTIN 300 MG PO CAPS: ORAL_CAPSULE | ORAL | Status: DC

## 2014-01-29 MED ORDER — VALACYCLOVIR HCL 1 G PO TABS: 1000.0000 mg | ORAL_TABLET | Freq: Three times a day (TID) | ORAL | Status: DC

## 2014-01-29 NOTE — Progress Notes (Signed)
   Subjective:    Patient ID: Sara Carson, female    DOB: [birth year], 68 y.o.   MRN: [id number]  HPI she returns today with continued pain in the areas where she had an output of shingles as noted in the office note 01/17/2014. She was treated appropriately with Valtrex and pain medicines and the rash has improved. She has been unable to take the pain medicines due to nausea and vomiting as a reaction. Pain is now severe enough to interfere with sleep and the wearing of clothes. She has not had fever. There is no new rash.  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 has no choice but to remain active She is currently not on other medications    Review of Systems Noncontributory    Objective:   Physical Exam BP 130/72  Pulse 100  Temp(Src) 97.3 F (36.3 C) (Oral)  Resp 18  Ht 5' 2.25" (1.581 m)  Wt 123 lb 12.8 oz (56.155 kg)  BMI 22.47 kg/m2  SpO2 98% The area of the rash occupies almost the entire T1 dermatome but is obviously fading and there are no vesicles present She remains hypersensitive to touch Range of motion of the shoulders uncomfortable       Assessment & Plan:  Shingles - Plan: valACYclovir (VALTREX) 1000 MG tablet  Postherpetic neuralgia  Meds ordered this encounter  Medications  . valACYclovir (VALTREX) 1000 MG tablet---repeated    Sig: Take 1 tablet (1,000 mg total) by mouth 3 (three) times daily.    Dispense:  21 tablet    Refill:  0  . lidocaine (LIDODERM) 5 %    Sig: Apply patch to most painful area. Up to 3 patches may be applied in a single application. Patch(es) may remain in place for up to 12 hours in any 24-hour period    Dispense:  30 patch    Refill:  2  . traMADol (ULTRAM) 50 MG tablet    Sig: Take 1-2 tablets (50-100 mg total) by mouth every 8 (eight) hours as needed.    Dispense:  10 tablet    Refill:  2  . gabapentin (NEURONTIN) 300 MG capsule    Sig: Day 1: 300 mg, Day 2: 300 mg twice daily, Day 3: 300 mg 3 times  daily    Dispense:  90 capsule    Refill:  1   Followup one week if not completely controlled

## 2014-01-29 NOTE — Telephone Encounter (Signed)
Pt is having a reaction to the medication she was given for Shingles including nausea.  [phone]

## 2014-01-29 NOTE — Telephone Encounter (Signed)
Pt states she is still in horrible pain from shingles. Tried taking Hydrocodone and it made her nauseous and she vomited. Advised RTC. She will come in today

## 2014-01-29 NOTE — Patient Instructions (Addendum)
neurontin dosing --Day 1: 300 mg, Day 2: 300 mg twice daily, Day 3: 300 mg 3 times daily; dose may be titrated as needed for pain relief (range: 1,800 to 3,600 mg/day in divided doses, daily doses &gt;1,800 mg do not generally show greater benefit)  Lidoderm: Postherpetic neuralgia: Apply patch to most painful area. Up to 3 patches may be applied in a single application. Patch(es) may remain in place for up to 12 hours in any 24-hour period   Shingles Shingles (herpes zoster) is an infection that is caused by the same virus that causes chickenpox (varicella). The infection causes a painful skin rash and fluid-filled blisters, which eventually break open, crust over, and heal. It may occur in any area of the body, but it usually affects only one side of the body or face. The pain of shingles usually lasts about 1 month. However, some people with shingles may develop long-term (chronic) pain in the affected area of the body. Shingles often occurs many years after the person had chickenpox. It is more common:  In people older than 50 years.  In people with weakened immune systems, such as those with HIV, AIDS, or cancer.  In people taking medicines that weaken the immune system, such as transplant medicines.  In people under great stress. CAUSES  Shingles is caused by the varicella zoster virus (VZV), which also causes chickenpox. After a person is infected with the virus, it can remain in the person's body for years in an inactive state (dormant). To cause shingles, the virus reactivates and breaks out as an infection in a nerve root. The virus can be spread from person to person (contagious) through contact with open blisters of the shingles rash. It will only spread to people who have not had chickenpox. When these people are exposed to the virus, they may develop chickenpox. They will not develop shingles. Once the blisters scab over, the person is no longer contagious and cannot spread the virus  to others. SIGNS AND SYMPTOMS  Shingles shows up in stages. The initial symptoms may be pain, itching, and tingling in an area of the skin. This pain is usually described as burning, stabbing, or throbbing.In a few days or weeks, a painful red rash will appear in the area where the pain, itching, and tingling were felt. The rash is usually on one side of the body in a band or belt-like pattern. Then, the rash usually turns into fluid-filled blisters. They will scab over and dry up in approximately 2-3 weeks. Flu-like symptoms may also occur with the initial symptoms, the rash, or the blisters. These may include:  Fever.  Chills.  Headache.  Upset stomach. DIAGNOSIS  Your health care provider will perform a skin exam to diagnose shingles. Skin scrapings or fluid samples may also be taken from the blisters. This sample will be examined under a microscope or sent to a lab for further testing. TREATMENT  There is no specific cure for shingles. Your health care provider will likely prescribe medicines to help you manage the pain, recover faster, and avoid long-term problems. This may include antiviral drugs, anti-inflammatory drugs, and pain medicines. HOME CARE INSTRUCTIONS   Take a cool bath or apply cool compresses to the area of the rash or blisters as directed. This may help with the pain and itching.   Take medicines only as directed by your health care provider.   Rest as directed by your health care provider.  Keep your rash and blisters clean with  mild soap and cool water or as directed by your health care provider.  Do not pick your blisters or scratch your rash. Apply an anti-itch cream or numbing creams to the affected area as directed by your health care provider.  Keep your shingles rash covered with a loose bandage (dressing).  Avoid skin contact with:  Babies.   Pregnant women.   Children with eczema.   Elderly people with transplants.   People with chronic  illnesses, such as leukemia or AIDS.   Wear loose-fitting clothing to help ease the pain of material rubbing against the rash.  Keep all follow-up visits as directed by your health care provider.If the area involved is on your face, you may receive a referral for a specialist, such as an eye doctor (ophthalmologist) or an ear, nose, and throat (ENT) doctor. Keeping all follow-up visits will help you avoid eye problems, chronic pain, or disability.  SEEK IMMEDIATE MEDICAL CARE IF:   You have facial pain, pain around the eye area, or loss of feeling on one side of your face.  You have ear pain or ringing in your ear.  You have loss of taste.  Your pain is not relieved with prescribed medicines.   Your redness or swelling spreads.   You have more pain and swelling.  Your condition is worsening or has changed.   You have a fever. MAKE SURE YOU:  Understand these instructions.  Will watch your condition.  Will get help right away if you are not doing well or get worse. Document Released: 04/07/2005 Document Revised: 08/22/2013 Document Reviewed: 11/20/2011 [facility] Patient Information 2015 [employer], [place]. This information is not intended to replace advice given to you by your health care provider. Make sure you discuss any questions you have with your health care provider.

## 2014-02-03 ENCOUNTER — Other Ambulatory Visit: Payer: Self-pay

## 2014-02-06 DIAGNOSIS — Z23 Encounter for immunization: Secondary | ICD-10-CM | POA: Diagnosis not present

## 2014-02-10 ENCOUNTER — Telehealth: Payer: Self-pay

## 2014-02-10 NOTE — Telephone Encounter (Signed)
Pt was prescribed NEURONTIN 300mg  on 101115 and she said she is still experiencing a lot of pain. She wants to know if increasing the dose is an option.

## 2014-02-10 NOTE — Telephone Encounter (Signed)
Dr. Doolittle please review. 

## 2014-02-10 NOTE — Telephone Encounter (Signed)
Spoke with Sara Carson and pt. Her pain is mainly at night. Advised to take 600 mg tonight and will wait for Dr. Ninfa Carson response tomorrow. Advised to RTC this weekend since it has been almost a month, but pt wanted to see what Dr. Laney Carson said instead.

## 2014-02-11 NOTE — Telephone Encounter (Signed)
See sig on Rx for advancing the dose and explain it to her

## 2014-02-13 ENCOUNTER — Telehealth: Payer: Self-pay

## 2014-02-13 NOTE — Telephone Encounter (Signed)
PA needed for lidocaine patches for pt's post-herpetic neuralgia. Completed on covermymeds. Pending.

## 2014-02-14 NOTE — Telephone Encounter (Signed)
PA approved through 02/14/15. Notified pharm and asked them to call pt as soon as ready.

## 2014-02-14 NOTE — Telephone Encounter (Signed)
Lm for rtn call 

## 2014-02-14 NOTE — Telephone Encounter (Signed)
Pt returned to her PCP that is now managing her care. She feels that this office was not responsive enough for a pt that was in as much pain as she was.

## 2014-02-20 DIAGNOSIS — B029 Zoster without complications: Secondary | ICD-10-CM | POA: Diagnosis not present

## 2014-02-20 DIAGNOSIS — B0229 Other postherpetic nervous system involvement: Secondary | ICD-10-CM | POA: Diagnosis not present

## 2014-04-04 DIAGNOSIS — R197 Diarrhea, unspecified: Secondary | ICD-10-CM | POA: Diagnosis not present

## 2014-04-04 DIAGNOSIS — B0229 Other postherpetic nervous system involvement: Secondary | ICD-10-CM | POA: Diagnosis not present

## 2014-04-27 DIAGNOSIS — H2513 Age-related nuclear cataract, bilateral: Secondary | ICD-10-CM | POA: Diagnosis not present

## 2014-04-27 DIAGNOSIS — H16103 Unspecified superficial keratitis, bilateral: Secondary | ICD-10-CM | POA: Diagnosis not present

## 2014-04-27 DIAGNOSIS — H04123 Dry eye syndrome of bilateral lacrimal glands: Secondary | ICD-10-CM | POA: Diagnosis not present

## 2014-04-27 DIAGNOSIS — H01001 Unspecified blepharitis right upper eyelid: Secondary | ICD-10-CM | POA: Diagnosis not present

## 2014-05-25 DIAGNOSIS — H04121 Dry eye syndrome of right lacrimal gland: Secondary | ICD-10-CM | POA: Diagnosis not present

## 2014-06-15 DIAGNOSIS — F3340 Major depressive disorder, recurrent, in remission, unspecified: Secondary | ICD-10-CM | POA: Diagnosis not present

## 2014-06-15 DIAGNOSIS — Z0000 Encounter for general adult medical examination without abnormal findings: Secondary | ICD-10-CM | POA: Diagnosis not present

## 2014-06-15 DIAGNOSIS — B0229 Other postherpetic nervous system involvement: Secondary | ICD-10-CM | POA: Diagnosis not present

## 2014-06-15 DIAGNOSIS — Z1389 Encounter for screening for other disorder: Secondary | ICD-10-CM | POA: Diagnosis not present

## 2014-06-15 DIAGNOSIS — Z79899 Other long term (current) drug therapy: Secondary | ICD-10-CM | POA: Diagnosis not present

## 2014-06-15 DIAGNOSIS — Z780 Asymptomatic menopausal state: Secondary | ICD-10-CM | POA: Diagnosis not present

## 2014-06-15 DIAGNOSIS — K219 Gastro-esophageal reflux disease without esophagitis: Secondary | ICD-10-CM | POA: Diagnosis not present

## 2014-07-26 DIAGNOSIS — H04122 Dry eye syndrome of left lacrimal gland: Secondary | ICD-10-CM | POA: Diagnosis not present

## 2014-07-26 DIAGNOSIS — H04121 Dry eye syndrome of right lacrimal gland: Secondary | ICD-10-CM | POA: Diagnosis not present

## 2014-08-24 DIAGNOSIS — F329 Major depressive disorder, single episode, unspecified: Secondary | ICD-10-CM | POA: Diagnosis not present

## 2014-08-24 DIAGNOSIS — B0229 Other postherpetic nervous system involvement: Secondary | ICD-10-CM | POA: Diagnosis not present

## 2014-10-16 ENCOUNTER — Other Ambulatory Visit: Payer: Self-pay

## 2015-01-18 DIAGNOSIS — D225 Melanocytic nevi of trunk: Secondary | ICD-10-CM | POA: Diagnosis not present

## 2015-01-18 DIAGNOSIS — D2371 Other benign neoplasm of skin of right lower limb, including hip: Secondary | ICD-10-CM | POA: Diagnosis not present

## 2015-01-18 DIAGNOSIS — D2372 Other benign neoplasm of skin of left lower limb, including hip: Secondary | ICD-10-CM | POA: Diagnosis not present

## 2015-01-18 DIAGNOSIS — L821 Other seborrheic keratosis: Secondary | ICD-10-CM | POA: Diagnosis not present

## 2015-01-18 DIAGNOSIS — D1801 Hemangioma of skin and subcutaneous tissue: Secondary | ICD-10-CM | POA: Diagnosis not present

## 2015-01-18 DIAGNOSIS — D224 Melanocytic nevi of scalp and neck: Secondary | ICD-10-CM | POA: Diagnosis not present

## 2015-01-18 DIAGNOSIS — D2272 Melanocytic nevi of left lower limb, including hip: Secondary | ICD-10-CM | POA: Diagnosis not present

## 2015-01-18 DIAGNOSIS — D2261 Melanocytic nevi of right upper limb, including shoulder: Secondary | ICD-10-CM | POA: Diagnosis not present

## 2015-01-30 DIAGNOSIS — Z23 Encounter for immunization: Secondary | ICD-10-CM | POA: Diagnosis not present

## 2015-02-01 DIAGNOSIS — Z1231 Encounter for screening mammogram for malignant neoplasm of breast: Secondary | ICD-10-CM | POA: Diagnosis not present

## 2015-02-01 DIAGNOSIS — M8589 Other specified disorders of bone density and structure, multiple sites: Secondary | ICD-10-CM | POA: Diagnosis not present

## 2015-02-09 DIAGNOSIS — N898 Other specified noninflammatory disorders of vagina: Secondary | ICD-10-CM | POA: Diagnosis not present

## 2015-02-09 DIAGNOSIS — R300 Dysuria: Secondary | ICD-10-CM | POA: Diagnosis not present

## 2015-06-07 DIAGNOSIS — H5213 Myopia, bilateral: Secondary | ICD-10-CM | POA: Diagnosis not present

## 2015-06-07 DIAGNOSIS — H43813 Vitreous degeneration, bilateral: Secondary | ICD-10-CM | POA: Diagnosis not present

## 2015-06-07 DIAGNOSIS — H04122 Dry eye syndrome of left lacrimal gland: Secondary | ICD-10-CM | POA: Diagnosis not present

## 2015-06-07 DIAGNOSIS — H524 Presbyopia: Secondary | ICD-10-CM | POA: Diagnosis not present

## 2015-06-18 DIAGNOSIS — M8589 Other specified disorders of bone density and structure, multiple sites: Secondary | ICD-10-CM | POA: Diagnosis not present

## 2015-06-18 DIAGNOSIS — K219 Gastro-esophageal reflux disease without esophagitis: Secondary | ICD-10-CM | POA: Diagnosis not present

## 2015-06-18 DIAGNOSIS — E222 Syndrome of inappropriate secretion of antidiuretic hormone: Secondary | ICD-10-CM | POA: Diagnosis not present

## 2015-06-18 DIAGNOSIS — Z0000 Encounter for general adult medical examination without abnormal findings: Secondary | ICD-10-CM | POA: Diagnosis not present

## 2015-06-18 DIAGNOSIS — R197 Diarrhea, unspecified: Secondary | ICD-10-CM | POA: Diagnosis not present

## 2015-06-18 DIAGNOSIS — M859 Disorder of bone density and structure, unspecified: Secondary | ICD-10-CM | POA: Diagnosis not present

## 2015-06-18 DIAGNOSIS — F3340 Major depressive disorder, recurrent, in remission, unspecified: Secondary | ICD-10-CM | POA: Diagnosis not present

## 2015-06-18 DIAGNOSIS — M797 Fibromyalgia: Secondary | ICD-10-CM | POA: Diagnosis not present

## 2015-06-18 DIAGNOSIS — Z79899 Other long term (current) drug therapy: Secondary | ICD-10-CM | POA: Diagnosis not present

## 2016-01-10 DIAGNOSIS — Z23 Encounter for immunization: Secondary | ICD-10-CM | POA: Diagnosis not present

## 2016-01-24 DIAGNOSIS — L821 Other seborrheic keratosis: Secondary | ICD-10-CM | POA: Diagnosis not present

## 2016-01-24 DIAGNOSIS — D2261 Melanocytic nevi of right upper limb, including shoulder: Secondary | ICD-10-CM | POA: Diagnosis not present

## 2016-01-24 DIAGNOSIS — L812 Freckles: Secondary | ICD-10-CM | POA: Diagnosis not present

## 2016-01-24 DIAGNOSIS — L723 Sebaceous cyst: Secondary | ICD-10-CM | POA: Diagnosis not present

## 2016-01-24 DIAGNOSIS — D1801 Hemangioma of skin and subcutaneous tissue: Secondary | ICD-10-CM | POA: Diagnosis not present

## 2016-01-24 DIAGNOSIS — D225 Melanocytic nevi of trunk: Secondary | ICD-10-CM | POA: Diagnosis not present

## 2016-01-24 DIAGNOSIS — L218 Other seborrheic dermatitis: Secondary | ICD-10-CM | POA: Diagnosis not present

## 2016-01-24 DIAGNOSIS — D2372 Other benign neoplasm of skin of left lower limb, including hip: Secondary | ICD-10-CM | POA: Diagnosis not present

## 2016-02-12 DIAGNOSIS — Z1231 Encounter for screening mammogram for malignant neoplasm of breast: Secondary | ICD-10-CM | POA: Diagnosis not present

## 2016-06-12 DIAGNOSIS — H04123 Dry eye syndrome of bilateral lacrimal glands: Secondary | ICD-10-CM | POA: Diagnosis not present

## 2016-06-12 DIAGNOSIS — H25813 Combined forms of age-related cataract, bilateral: Secondary | ICD-10-CM | POA: Diagnosis not present

## 2016-06-12 DIAGNOSIS — H524 Presbyopia: Secondary | ICD-10-CM | POA: Diagnosis not present

## 2016-06-12 DIAGNOSIS — H43813 Vitreous degeneration, bilateral: Secondary | ICD-10-CM | POA: Diagnosis not present

## 2016-06-18 DIAGNOSIS — D509 Iron deficiency anemia, unspecified: Secondary | ICD-10-CM | POA: Diagnosis not present

## 2016-06-18 DIAGNOSIS — E222 Syndrome of inappropriate secretion of antidiuretic hormone: Secondary | ICD-10-CM | POA: Diagnosis not present

## 2016-06-18 DIAGNOSIS — Z79899 Other long term (current) drug therapy: Secondary | ICD-10-CM | POA: Diagnosis not present

## 2016-06-18 DIAGNOSIS — Z1389 Encounter for screening for other disorder: Secondary | ICD-10-CM | POA: Diagnosis not present

## 2016-06-18 DIAGNOSIS — Z0000 Encounter for general adult medical examination without abnormal findings: Secondary | ICD-10-CM | POA: Diagnosis not present

## 2016-06-18 DIAGNOSIS — F3340 Major depressive disorder, recurrent, in remission, unspecified: Secondary | ICD-10-CM | POA: Diagnosis not present

## 2016-07-11 DIAGNOSIS — H04122 Dry eye syndrome of left lacrimal gland: Secondary | ICD-10-CM | POA: Diagnosis not present

## 2016-07-11 DIAGNOSIS — H04121 Dry eye syndrome of right lacrimal gland: Secondary | ICD-10-CM | POA: Diagnosis not present

## 2017-01-27 DIAGNOSIS — Z23 Encounter for immunization: Secondary | ICD-10-CM | POA: Diagnosis not present

## 2017-02-03 DIAGNOSIS — L918 Other hypertrophic disorders of the skin: Secondary | ICD-10-CM | POA: Diagnosis not present

## 2017-02-03 DIAGNOSIS — D1801 Hemangioma of skin and subcutaneous tissue: Secondary | ICD-10-CM | POA: Diagnosis not present

## 2017-02-03 DIAGNOSIS — D225 Melanocytic nevi of trunk: Secondary | ICD-10-CM | POA: Diagnosis not present

## 2017-02-03 DIAGNOSIS — D2372 Other benign neoplasm of skin of left lower limb, including hip: Secondary | ICD-10-CM | POA: Diagnosis not present

## 2017-02-03 DIAGNOSIS — D2272 Melanocytic nevi of left lower limb, including hip: Secondary | ICD-10-CM | POA: Diagnosis not present

## 2017-02-03 DIAGNOSIS — D2371 Other benign neoplasm of skin of right lower limb, including hip: Secondary | ICD-10-CM | POA: Diagnosis not present

## 2017-02-03 DIAGNOSIS — L308 Other specified dermatitis: Secondary | ICD-10-CM | POA: Diagnosis not present

## 2017-02-03 DIAGNOSIS — L821 Other seborrheic keratosis: Secondary | ICD-10-CM | POA: Diagnosis not present

## 2017-02-03 DIAGNOSIS — L812 Freckles: Secondary | ICD-10-CM | POA: Diagnosis not present

## 2017-02-24 DIAGNOSIS — Z803 Family history of malignant neoplasm of breast: Secondary | ICD-10-CM | POA: Diagnosis not present

## 2017-02-24 DIAGNOSIS — M8589 Other specified disorders of bone density and structure, multiple sites: Secondary | ICD-10-CM | POA: Diagnosis not present

## 2017-02-24 DIAGNOSIS — Z1231 Encounter for screening mammogram for malignant neoplasm of breast: Secondary | ICD-10-CM | POA: Diagnosis not present

## 2017-06-12 DIAGNOSIS — H43813 Vitreous degeneration, bilateral: Secondary | ICD-10-CM | POA: Diagnosis not present

## 2017-06-12 DIAGNOSIS — H25813 Combined forms of age-related cataract, bilateral: Secondary | ICD-10-CM | POA: Diagnosis not present

## 2017-06-12 DIAGNOSIS — H16223 Keratoconjunctivitis sicca, not specified as Sjogren's, bilateral: Secondary | ICD-10-CM | POA: Diagnosis not present

## 2017-06-12 DIAGNOSIS — H52203 Unspecified astigmatism, bilateral: Secondary | ICD-10-CM | POA: Diagnosis not present

## 2017-06-24 DIAGNOSIS — M797 Fibromyalgia: Secondary | ICD-10-CM | POA: Diagnosis not present

## 2017-06-24 DIAGNOSIS — E222 Syndrome of inappropriate secretion of antidiuretic hormone: Secondary | ICD-10-CM | POA: Diagnosis not present

## 2017-06-24 DIAGNOSIS — D509 Iron deficiency anemia, unspecified: Secondary | ICD-10-CM | POA: Diagnosis not present

## 2017-06-24 DIAGNOSIS — M85851 Other specified disorders of bone density and structure, right thigh: Secondary | ICD-10-CM | POA: Diagnosis not present

## 2017-06-24 DIAGNOSIS — Z0000 Encounter for general adult medical examination without abnormal findings: Secondary | ICD-10-CM | POA: Diagnosis not present

## 2017-06-24 DIAGNOSIS — K219 Gastro-esophageal reflux disease without esophagitis: Secondary | ICD-10-CM | POA: Diagnosis not present

## 2017-06-24 DIAGNOSIS — Z1389 Encounter for screening for other disorder: Secondary | ICD-10-CM | POA: Diagnosis not present

## 2017-06-24 DIAGNOSIS — M85852 Other specified disorders of bone density and structure, left thigh: Secondary | ICD-10-CM | POA: Diagnosis not present

## 2017-06-24 DIAGNOSIS — F3340 Major depressive disorder, recurrent, in remission, unspecified: Secondary | ICD-10-CM | POA: Diagnosis not present

## 2018-01-14 DIAGNOSIS — Z23 Encounter for immunization: Secondary | ICD-10-CM | POA: Diagnosis not present

## 2018-02-19 DIAGNOSIS — D2272 Melanocytic nevi of left lower limb, including hip: Secondary | ICD-10-CM | POA: Diagnosis not present

## 2018-02-19 DIAGNOSIS — D2372 Other benign neoplasm of skin of left lower limb, including hip: Secondary | ICD-10-CM | POA: Diagnosis not present

## 2018-02-19 DIAGNOSIS — D1801 Hemangioma of skin and subcutaneous tissue: Secondary | ICD-10-CM | POA: Diagnosis not present

## 2018-02-19 DIAGNOSIS — D225 Melanocytic nevi of trunk: Secondary | ICD-10-CM | POA: Diagnosis not present

## 2018-02-19 DIAGNOSIS — L720 Epidermal cyst: Secondary | ICD-10-CM | POA: Diagnosis not present

## 2018-02-19 DIAGNOSIS — L821 Other seborrheic keratosis: Secondary | ICD-10-CM | POA: Diagnosis not present

## 2018-02-19 DIAGNOSIS — D224 Melanocytic nevi of scalp and neck: Secondary | ICD-10-CM | POA: Diagnosis not present

## 2018-02-19 DIAGNOSIS — D2371 Other benign neoplasm of skin of right lower limb, including hip: Secondary | ICD-10-CM | POA: Diagnosis not present

## 2018-03-01 DIAGNOSIS — Z803 Family history of malignant neoplasm of breast: Secondary | ICD-10-CM | POA: Diagnosis not present

## 2018-03-01 DIAGNOSIS — Z1231 Encounter for screening mammogram for malignant neoplasm of breast: Secondary | ICD-10-CM | POA: Diagnosis not present

## 2018-03-11 DIAGNOSIS — H0014 Chalazion left upper eyelid: Secondary | ICD-10-CM | POA: Diagnosis not present

## 2018-03-11 DIAGNOSIS — H16223 Keratoconjunctivitis sicca, not specified as Sjogren's, bilateral: Secondary | ICD-10-CM | POA: Diagnosis not present

## 2018-03-22 DIAGNOSIS — H0014 Chalazion left upper eyelid: Secondary | ICD-10-CM | POA: Diagnosis not present

## 2018-03-22 DIAGNOSIS — H04122 Dry eye syndrome of left lacrimal gland: Secondary | ICD-10-CM | POA: Diagnosis not present

## 2018-06-17 DIAGNOSIS — H52203 Unspecified astigmatism, bilateral: Secondary | ICD-10-CM | POA: Diagnosis not present

## 2018-06-17 DIAGNOSIS — H04123 Dry eye syndrome of bilateral lacrimal glands: Secondary | ICD-10-CM | POA: Diagnosis not present

## 2018-06-17 DIAGNOSIS — H43813 Vitreous degeneration, bilateral: Secondary | ICD-10-CM | POA: Diagnosis not present

## 2018-06-17 DIAGNOSIS — H25813 Combined forms of age-related cataract, bilateral: Secondary | ICD-10-CM | POA: Diagnosis not present

## 2018-07-01 DIAGNOSIS — Z0000 Encounter for general adult medical examination without abnormal findings: Secondary | ICD-10-CM | POA: Diagnosis not present

## 2018-07-01 DIAGNOSIS — B0229 Other postherpetic nervous system involvement: Secondary | ICD-10-CM | POA: Diagnosis not present

## 2018-07-01 DIAGNOSIS — D508 Other iron deficiency anemias: Secondary | ICD-10-CM | POA: Diagnosis not present

## 2018-07-01 DIAGNOSIS — R197 Diarrhea, unspecified: Secondary | ICD-10-CM | POA: Diagnosis not present

## 2018-07-01 DIAGNOSIS — Z1389 Encounter for screening for other disorder: Secondary | ICD-10-CM | POA: Diagnosis not present

## 2018-07-01 DIAGNOSIS — E222 Syndrome of inappropriate secretion of antidiuretic hormone: Secondary | ICD-10-CM | POA: Diagnosis not present

## 2018-07-01 DIAGNOSIS — D649 Anemia, unspecified: Secondary | ICD-10-CM | POA: Diagnosis not present

## 2018-07-01 DIAGNOSIS — G629 Polyneuropathy, unspecified: Secondary | ICD-10-CM | POA: Diagnosis not present

## 2018-07-01 DIAGNOSIS — Z79899 Other long term (current) drug therapy: Secondary | ICD-10-CM | POA: Diagnosis not present

## 2018-07-01 DIAGNOSIS — F3340 Major depressive disorder, recurrent, in remission, unspecified: Secondary | ICD-10-CM | POA: Diagnosis not present

## 2018-07-14 DIAGNOSIS — H04121 Dry eye syndrome of right lacrimal gland: Secondary | ICD-10-CM | POA: Diagnosis not present

## 2018-07-14 DIAGNOSIS — H04122 Dry eye syndrome of left lacrimal gland: Secondary | ICD-10-CM | POA: Diagnosis not present

## 2018-08-26 DIAGNOSIS — Z20828 Contact with and (suspected) exposure to other viral communicable diseases: Secondary | ICD-10-CM | POA: Diagnosis not present

## 2019-01-13 DIAGNOSIS — Z23 Encounter for immunization: Secondary | ICD-10-CM | POA: Diagnosis not present

## 2019-02-24 DIAGNOSIS — L821 Other seborrheic keratosis: Secondary | ICD-10-CM | POA: Diagnosis not present

## 2019-02-24 DIAGNOSIS — D225 Melanocytic nevi of trunk: Secondary | ICD-10-CM | POA: Diagnosis not present

## 2019-02-24 DIAGNOSIS — L308 Other specified dermatitis: Secondary | ICD-10-CM | POA: Diagnosis not present

## 2019-02-24 DIAGNOSIS — L812 Freckles: Secondary | ICD-10-CM | POA: Diagnosis not present

## 2019-02-24 DIAGNOSIS — D2372 Other benign neoplasm of skin of left lower limb, including hip: Secondary | ICD-10-CM | POA: Diagnosis not present

## 2019-02-24 DIAGNOSIS — D2371 Other benign neoplasm of skin of right lower limb, including hip: Secondary | ICD-10-CM | POA: Diagnosis not present

## 2019-02-24 DIAGNOSIS — D224 Melanocytic nevi of scalp and neck: Secondary | ICD-10-CM | POA: Diagnosis not present

## 2019-03-07 DIAGNOSIS — Z803 Family history of malignant neoplasm of breast: Secondary | ICD-10-CM | POA: Diagnosis not present

## 2019-03-07 DIAGNOSIS — Z1231 Encounter for screening mammogram for malignant neoplasm of breast: Secondary | ICD-10-CM | POA: Diagnosis not present

## 2019-07-05 DIAGNOSIS — M85851 Other specified disorders of bone density and structure, right thigh: Secondary | ICD-10-CM | POA: Diagnosis not present

## 2019-07-05 DIAGNOSIS — M85852 Other specified disorders of bone density and structure, left thigh: Secondary | ICD-10-CM | POA: Diagnosis not present

## 2019-07-13 DIAGNOSIS — H43813 Vitreous degeneration, bilateral: Secondary | ICD-10-CM | POA: Diagnosis not present

## 2019-07-13 DIAGNOSIS — H5213 Myopia, bilateral: Secondary | ICD-10-CM | POA: Diagnosis not present

## 2019-07-13 DIAGNOSIS — H25813 Combined forms of age-related cataract, bilateral: Secondary | ICD-10-CM | POA: Diagnosis not present

## 2019-07-13 DIAGNOSIS — H04123 Dry eye syndrome of bilateral lacrimal glands: Secondary | ICD-10-CM | POA: Diagnosis not present

## 2019-07-14 DIAGNOSIS — Z1389 Encounter for screening for other disorder: Secondary | ICD-10-CM | POA: Diagnosis not present

## 2019-07-14 DIAGNOSIS — M8588 Other specified disorders of bone density and structure, other site: Secondary | ICD-10-CM | POA: Diagnosis not present

## 2019-07-14 DIAGNOSIS — F3340 Major depressive disorder, recurrent, in remission, unspecified: Secondary | ICD-10-CM | POA: Diagnosis not present

## 2019-07-14 DIAGNOSIS — Z79899 Other long term (current) drug therapy: Secondary | ICD-10-CM | POA: Diagnosis not present

## 2019-07-14 DIAGNOSIS — G629 Polyneuropathy, unspecified: Secondary | ICD-10-CM | POA: Diagnosis not present

## 2019-07-14 DIAGNOSIS — Z0000 Encounter for general adult medical examination without abnormal findings: Secondary | ICD-10-CM | POA: Diagnosis not present

## 2019-07-14 DIAGNOSIS — D509 Iron deficiency anemia, unspecified: Secondary | ICD-10-CM | POA: Diagnosis not present

## 2019-07-14 DIAGNOSIS — E222 Syndrome of inappropriate secretion of antidiuretic hormone: Secondary | ICD-10-CM | POA: Diagnosis not present

## 2019-10-21 ENCOUNTER — Ambulatory Visit (HOSPITAL_COMMUNITY)
Admission: EM | Admit: 2019-10-21 | Discharge: 2019-10-21 | Disposition: A | Discharge status: Home / Self Care | Payer: Medicare Other

## 2019-10-21 ENCOUNTER — Ambulatory Visit (INDEPENDENT_AMBULATORY_CARE_PROVIDER_SITE_OTHER): Payer: Medicare Other

## 2019-10-21 ENCOUNTER — Encounter (HOSPITAL_COMMUNITY): Payer: Self-pay

## 2019-10-21 ENCOUNTER — Other Ambulatory Visit: Payer: Self-pay

## 2019-10-21 DIAGNOSIS — S52501A Unspecified fracture of the lower end of right radius, initial encounter for closed fracture: Secondary | ICD-10-CM | POA: Diagnosis not present

## 2019-10-21 DIAGNOSIS — S52591A Other fractures of lower end of right radius, initial encounter for closed fracture: Secondary | ICD-10-CM | POA: Diagnosis not present

## 2019-10-21 DIAGNOSIS — S0083XA Contusion of other part of head, initial encounter: Secondary | ICD-10-CM | POA: Diagnosis not present

## 2019-10-21 DIAGNOSIS — S59291A Other physeal fracture of lower end of radius, right arm, initial encounter for closed fracture: Secondary | ICD-10-CM | POA: Diagnosis not present

## 2019-10-21 DIAGNOSIS — S6991XA Unspecified injury of right wrist, hand and finger(s), initial encounter: Secondary | ICD-10-CM

## 2019-10-21 HISTORY — DX: Zoster without complications: B02.9

## 2019-10-21 IMAGING — DX DG HAND COMPLETE 3+V*R*
3 series · 3 of 3 positions shown · non-contrast
Comparison: No prior.

CLINICAL DATA: Injury.

EXAM:
RIGHT HAND - COMPLETE 3+ VIEW

[hand pa]
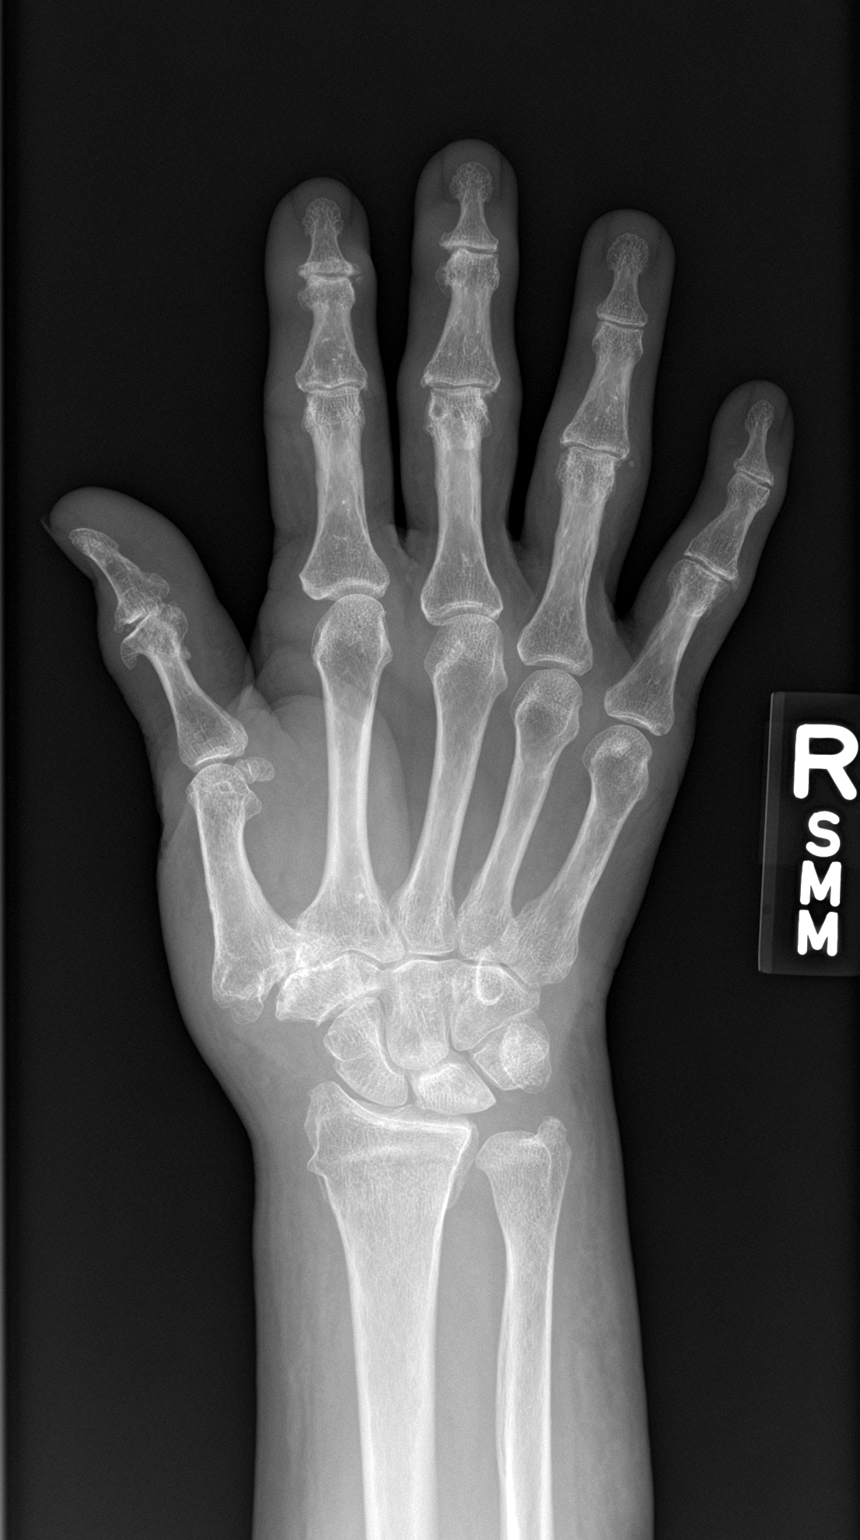

[hand obl]
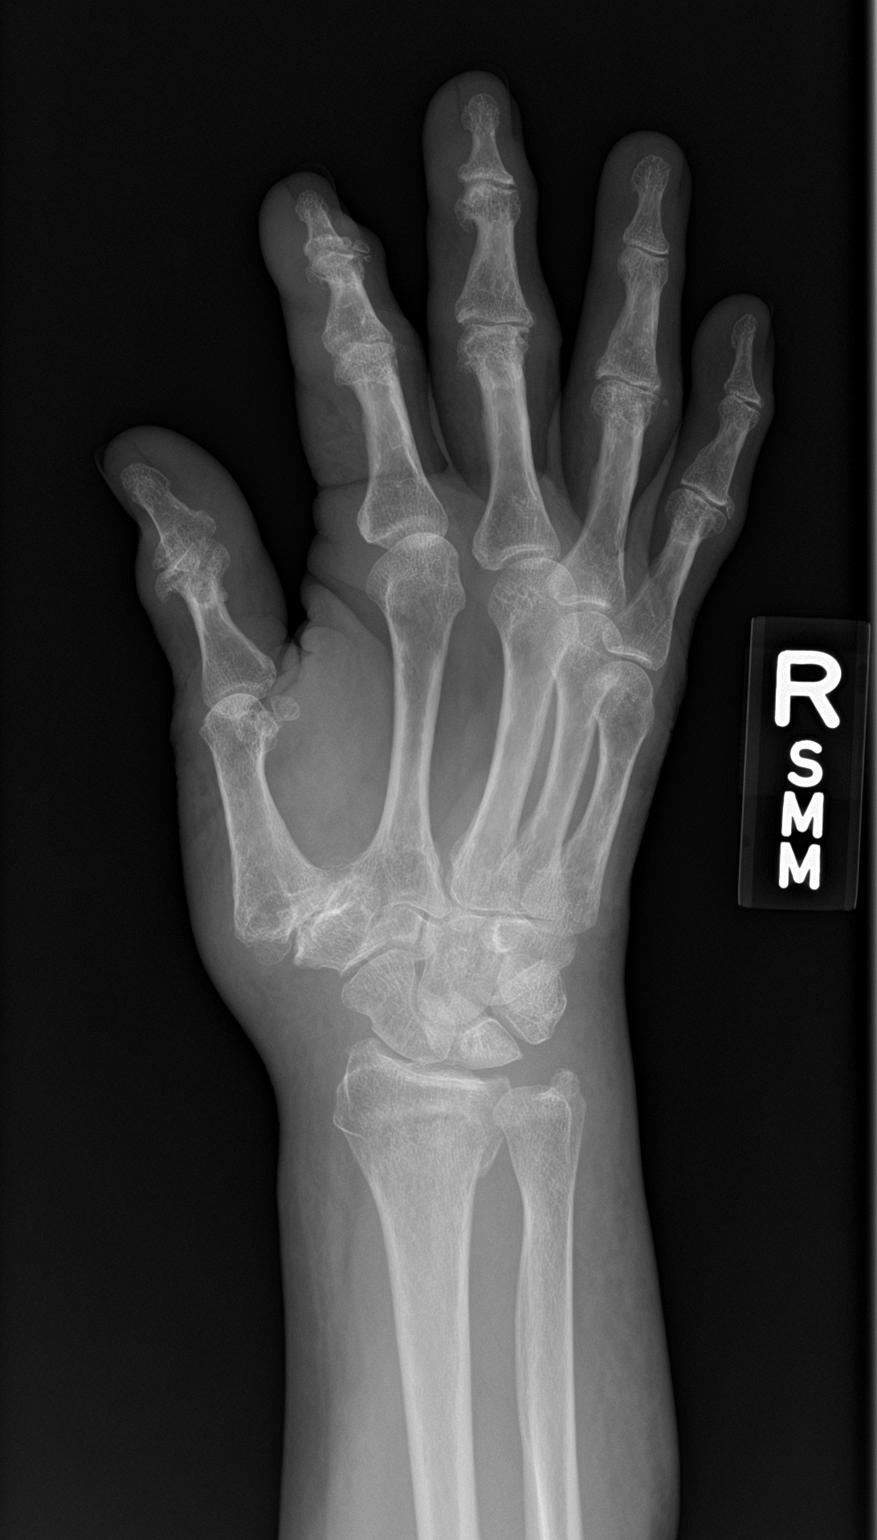

[hand lat]
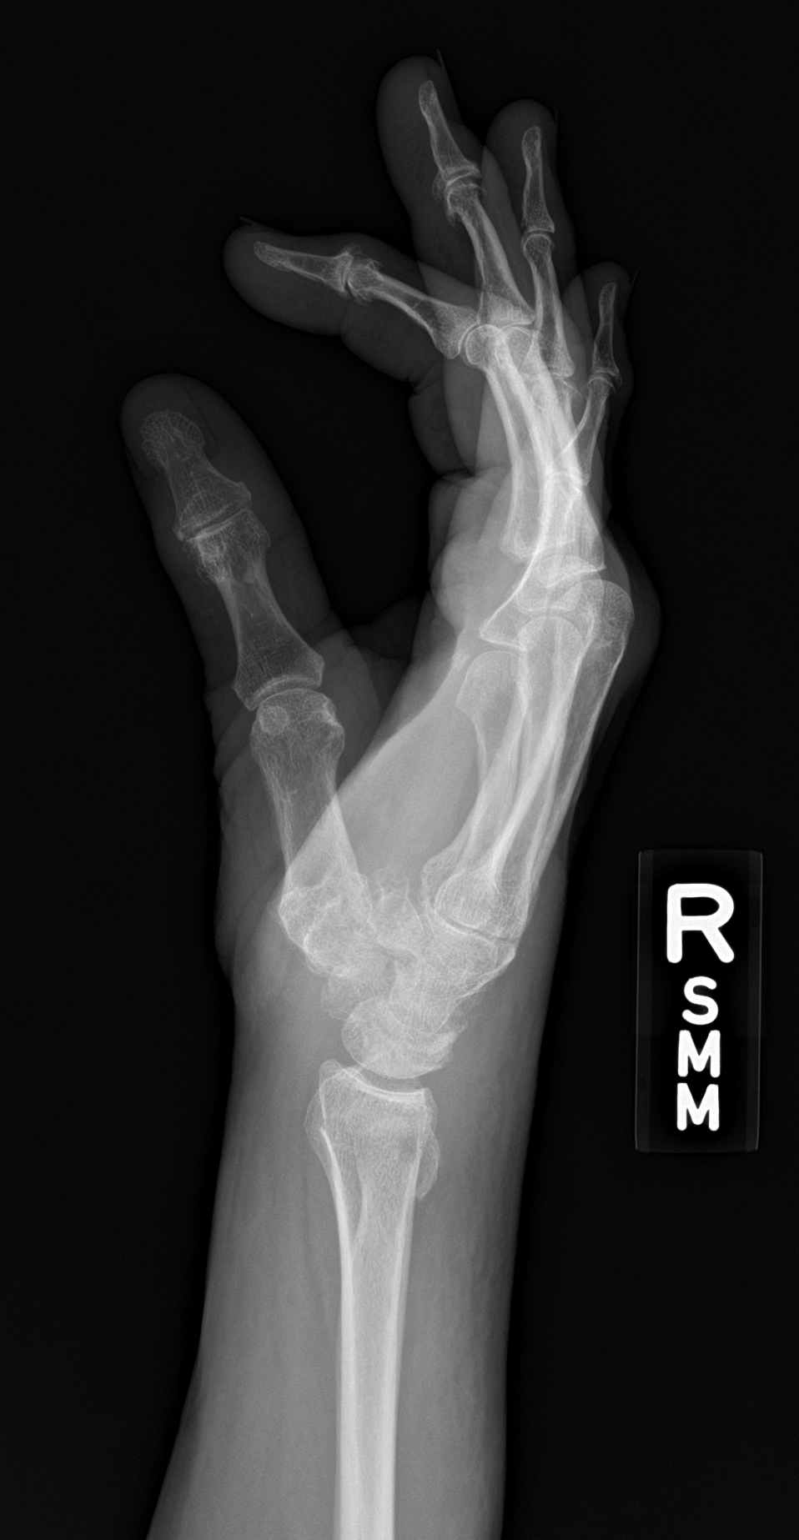

[3 of 3 positions shown; findings below may reference images not displayed]

FINDINGS: Slightly angulated fracture of the distal radial metaphysis noted.
Distal intact. Diffuse degenerative change. Degenerative changes
most prominent about the first carpometacarpal and intercarpal
joints.
IMPRESSION: 1.  Slightly angulated fracture of the distal radial metaphysis.

2.  Diffuse degenerative change.

## 2019-10-21 NOTE — ED Notes (Signed)
Ortho tech called by Fayrene Fearing, RN. Ortho tech advised that she will be here for splint placement when she is done with current ortho task.

## 2019-10-21 NOTE — ED Provider Notes (Signed)
[place]    CSN: [id number] Arrival date & time: 10/21/19  6213      History   Chief Complaint Chief Complaint  Patient presents with   Wrist Injury    HPI Sara Carson is a 74 y.o. female. who presents with R wrist pain since a fall yesterday. She ran into a clear door and bounced and hit her face on the sick and landed on her R wrist. She denies feeling dizzy before the fall and denies LOC. Has swelling of her R lower lip, but her teeth or gums are fine. Most of her pain is in her R wrist and cant move her thumb much due to pain. Denies pain anywhere else besides her face.  She has applied ice on her face which was more swollen and to her wrist, but her wrist is not better. She tool Tylenol last night and that helped her rest.     Past Medical History:  Diagnosis Date   Anemia    Depression    Shingles     There are no problems to display for this patient.   Past Surgical History:  Procedure Laterality Date   ABDOMINAL HYSTERECTOMY      OB History   No obstetric history on file.      Home Medications    Prior to Admission medications   Medication Sig Start Date End Date Taking? Authorizing Provider  cholecalciferol (VITAMIN D3) 25 MCG (1000 UNIT) tablet Take 1,000 Units by mouth daily.   Yes [provider]  Multiple Vitamin (MULTIVITAMIN) tablet Take 1 tablet by mouth daily.   Yes [provider]  calcium citrate (CALCITRATE - DOSED IN MG ELEMENTAL CALCIUM) 950 MG tablet Take 200 mg of elemental calcium by mouth daily.    [provider]  citalopram (CELEXA) 10 MG tablet Take 10 mg by mouth daily. 10/01/19   [provider]  vitamin C (ASCORBIC ACID) 500 MG tablet Take 500 mg by mouth daily.    [provider]  gabapentin (NEURONTIN) 300 MG capsule Day 1: 300 mg, Day 2: 300 mg twice daily, Day 3: 300 mg 3 times daily 01/29/14 10/21/19  Leandrew Koyanagi, MD    Family History Family History    Problem Relation Age of Onset   Cancer Mother    Diabetes Father    Hyperlipidemia Father    Hypertension Father     Social History Social History   Tobacco Use   Smoking status: Never Smoker  Substance Use Topics   Alcohol use: Yes    Alcohol/week: 3.0 standard drinks    Types: 3 Glasses of wine per week   Drug use: No     Allergies   Amoxicillin and Levaquin [levofloxacin in d5w]   Review of Systems Review of Systems  Musculoskeletal: Positive for arthralgias and joint swelling. Negative for back pain, gait problem and neck pain.       Has R wrist swelling and pain  Skin: Positive for color change. Negative for pallor and wound.       Has hematoma of R lower lip with swelling  Neurological: Negative for dizziness, syncope, light-headedness and numbness.     Physical Exam Triage Vital Signs ED Triage Vitals [10/21/19 0851]  Enc Vitals Group     BP 127/75     Pulse Rate 84     Resp 18     Temp 98.6 F (37 C)     Temp Source Oral  SpO2 99 %     Weight 126 lb (57.2 kg)     Height 5' 2.5" (1.588 m)     Head Circumference      Peak Flow      Pain Score 10     Pain Loc      Pain Edu?      Excl. in [place]?    No data found.  Updated Vital Signs BP 127/75    Pulse 84    Temp 98.6 F (37 C) (Oral)    Resp 18    Ht 5' 2.5" (1.588 m)    Wt 126 lb (57.2 kg)    SpO2 99%    BMI 22.68 kg/m   Visual Acuity Right Eye Distance:   Left Eye Distance:   Bilateral Distance:    Right Eye Near:   Left Eye Near:    Bilateral Near:     Physical Exam Vitals and nursing note reviewed.  Constitutional:      General: She is not in acute distress.    Appearance: She is normal weight. She is not toxic-appearing.  HENT:     Head: Atraumatic.     Right Ear: External ear normal.     Left Ear: External ear normal.     Nose: Nose normal.     Mouth/Throat:     Mouth: Mucous membranes are moist.     Comments: Has large area of swelling with hematoma of R lower lip.  Her gums and teeth are intact under this area and only has mild tenderness on the gum Eyes:     General: No scleral icterus.    Conjunctiva/sclera: Conjunctivae normal.  Neck:     Comments: Does not have cervical vertebral tenderness Cardiovascular:     Pulses: Normal pulses.  Pulmonary:     Effort: Pulmonary effort is normal.  Musculoskeletal:        General: Swelling, tenderness, deformity and signs of injury present.     Cervical back: Neck supple. No rigidity or tenderness.     Comments: Of R wrist. Has decreased ROM of R thumb due to pain, and is unable to more her wrist due to pain. She has a distal radial deformity. Ulna is not tender.   Lymphadenopathy:     Cervical: No cervical adenopathy.  Skin:    General: Skin is warm and dry.     Findings: Bruising present. No erythema, lesion or rash.  Neurological:     Mental Status: She is alert and oriented to person, place, and time.     Gait: Gait normal.  Psychiatric:        Mood and Affect: Mood normal.        Behavior: Behavior normal.        Thought Content: Thought content normal.        Judgment: Judgment normal.      UC Treatments / Results  Labs (all labs ordered are listed, but only abnormal results are displayed) Labs Reviewed - No data to display  EKG   Radiology DG Hand Complete Right  Result Date: 10/21/2019 CLINICAL DATA:  Injury. EXAM: RIGHT HAND - COMPLETE 3+ VIEW COMPARISON:  No prior. FINDINGS: Slightly angulated fracture of the distal radial metaphysis noted. Distal intact. Diffuse degenerative change. Degenerative changes most prominent about the first carpometacarpal and intercarpal joints. IMPRESSION: 1.  Slightly angulated fracture of the distal radial metaphysis. 2.  Diffuse degenerative change. Electronically Signed   By: Marcello Moores  Register   On:  10/21/2019 09:26    Procedures Procedures (including critical care time)  Medications Ordered in UC Medications - No data to display  Initial  Impression / Assessment and Plan / UC Course  I have reviewed the triage vital signs and the nursing notes. Pertinent imaging results that were available during my care of the patient were reviewed by me and considered in my medical decision making (see chart for details) and I reviewed the radiology reading with her as well.  She was placed on wrist splint with thumb spika. She declined pain meds and want to continue Tylenol.    Final Clinical Impressions(s) / UC Diagnoses   Final diagnoses:  Contusion of face, initial encounter  Closed fracture of distal end of right radius, unspecified fracture morphology, initial encounter     Discharge Instructions     Continue icing area of pain for full 48 h from the time of injury. 15 min at a time 4-5 times a day and elevate your arm when resting.  Follow up with orthopedics in 3 days or sooner if you have more pain or there are changes in skin color or numbness. They are open on the weekends since they are urgent care.     ED Prescriptions    None     PDMP not reviewed this encounter.   Shelby Mattocks, [place] 10/21/19 1132

## 2019-10-21 NOTE — ED Notes (Signed)
Notified pt that we are awaiting ortho tech for splint placement. Ice water provided for pt comfort/care.

## 2019-10-21 NOTE — ED Triage Notes (Signed)
Pt ran into a clear door and bounced off of it and hit her face off of the sink and fell on right wrist. Pt denies blood thinners and denies LOC. Pt has ecchymosis of chin. Pt's chin has 2+ swelling, lower lip has 1+ swelling. Pt c/o 10/10 throbbing pain in right wrist.  Pt has 2+ swelling of right wrist and 3+ swelling of right hand. Pt has 2+ right radial pulse, cap refill less than 3 sec, pt able to wiggle finger. Pt denies tingling, but states fingers feel numb.

## 2019-10-21 NOTE — Discharge Instructions (Addendum)
Continue icing area of pain for full 48 h from the time of injury. 15 min at a time 4-5 times a day and elevate your arm when resting.  Follow up with orthopedics in 3 days or sooner if you have more pain or there are changes in skin color or numbness. They are open on the weekends since they are urgent care.

## 2019-11-08 DIAGNOSIS — S52501A Unspecified fracture of the lower end of right radius, initial encounter for closed fracture: Secondary | ICD-10-CM | POA: Diagnosis not present

## 2019-11-15 DIAGNOSIS — S52501A Unspecified fracture of the lower end of right radius, initial encounter for closed fracture: Secondary | ICD-10-CM | POA: Diagnosis not present

## 2019-11-16 ENCOUNTER — Ambulatory Visit: Payer: Medicare Other | Attending: Internal Medicine

## 2019-11-16 DIAGNOSIS — Z20822 Contact with and (suspected) exposure to covid-19: Secondary | ICD-10-CM | POA: Diagnosis not present

## 2019-11-17 LAB — SARS-COV-2, NAA 2 DAY TAT

## 2019-11-17 LAB — NOVEL CORONAVIRUS, NAA: SARS-CoV-2, NAA: NOT DETECTED

## 2019-11-30 DIAGNOSIS — M25631 Stiffness of right wrist, not elsewhere classified: Secondary | ICD-10-CM | POA: Diagnosis not present

## 2019-11-30 DIAGNOSIS — S52501A Unspecified fracture of the lower end of right radius, initial encounter for closed fracture: Secondary | ICD-10-CM | POA: Diagnosis not present

## 2019-12-13 DIAGNOSIS — M25631 Stiffness of right wrist, not elsewhere classified: Secondary | ICD-10-CM | POA: Diagnosis not present

## 2019-12-22 DIAGNOSIS — M25531 Pain in right wrist: Secondary | ICD-10-CM | POA: Diagnosis not present

## 2019-12-23 DIAGNOSIS — S52501A Unspecified fracture of the lower end of right radius, initial encounter for closed fracture: Secondary | ICD-10-CM | POA: Diagnosis not present

## 2020-01-02 DIAGNOSIS — M25631 Stiffness of right wrist, not elsewhere classified: Secondary | ICD-10-CM | POA: Diagnosis not present

## 2020-01-05 DIAGNOSIS — Z23 Encounter for immunization: Secondary | ICD-10-CM | POA: Diagnosis not present

## 2020-01-16 DIAGNOSIS — Z23 Encounter for immunization: Secondary | ICD-10-CM | POA: Diagnosis not present

## 2020-01-16 DIAGNOSIS — M25631 Stiffness of right wrist, not elsewhere classified: Secondary | ICD-10-CM | POA: Diagnosis not present

## 2020-01-20 DIAGNOSIS — S52501A Unspecified fracture of the lower end of right radius, initial encounter for closed fracture: Secondary | ICD-10-CM | POA: Diagnosis not present

## 2020-01-30 DIAGNOSIS — M25631 Stiffness of right wrist, not elsewhere classified: Secondary | ICD-10-CM | POA: Diagnosis not present

## 2020-02-06 DIAGNOSIS — M25631 Stiffness of right wrist, not elsewhere classified: Secondary | ICD-10-CM | POA: Diagnosis not present

## 2020-02-13 DIAGNOSIS — M25631 Stiffness of right wrist, not elsewhere classified: Secondary | ICD-10-CM | POA: Diagnosis not present

## 2020-02-28 DIAGNOSIS — D2272 Melanocytic nevi of left lower limb, including hip: Secondary | ICD-10-CM | POA: Diagnosis not present

## 2020-02-28 DIAGNOSIS — D1801 Hemangioma of skin and subcutaneous tissue: Secondary | ICD-10-CM | POA: Diagnosis not present

## 2020-02-28 DIAGNOSIS — L814 Other melanin hyperpigmentation: Secondary | ICD-10-CM | POA: Diagnosis not present

## 2020-02-28 DIAGNOSIS — L821 Other seborrheic keratosis: Secondary | ICD-10-CM | POA: Diagnosis not present

## 2020-02-28 DIAGNOSIS — D2371 Other benign neoplasm of skin of right lower limb, including hip: Secondary | ICD-10-CM | POA: Diagnosis not present

## 2020-02-28 DIAGNOSIS — D2372 Other benign neoplasm of skin of left lower limb, including hip: Secondary | ICD-10-CM | POA: Diagnosis not present

## 2020-02-28 DIAGNOSIS — D2262 Melanocytic nevi of left upper limb, including shoulder: Secondary | ICD-10-CM | POA: Diagnosis not present

## 2020-02-28 DIAGNOSIS — D225 Melanocytic nevi of trunk: Secondary | ICD-10-CM | POA: Diagnosis not present

## 2020-02-29 DIAGNOSIS — M25631 Stiffness of right wrist, not elsewhere classified: Secondary | ICD-10-CM | POA: Diagnosis not present

## 2020-03-09 DIAGNOSIS — S52501A Unspecified fracture of the lower end of right radius, initial encounter for closed fracture: Secondary | ICD-10-CM | POA: Diagnosis not present

## 2020-03-14 DIAGNOSIS — Z1231 Encounter for screening mammogram for malignant neoplasm of breast: Secondary | ICD-10-CM | POA: Diagnosis not present

## 2020-06-25 DIAGNOSIS — H52203 Unspecified astigmatism, bilateral: Secondary | ICD-10-CM | POA: Diagnosis not present

## 2020-06-25 DIAGNOSIS — H43813 Vitreous degeneration, bilateral: Secondary | ICD-10-CM | POA: Diagnosis not present

## 2020-06-25 DIAGNOSIS — H25813 Combined forms of age-related cataract, bilateral: Secondary | ICD-10-CM | POA: Diagnosis not present

## 2020-06-25 DIAGNOSIS — H16223 Keratoconjunctivitis sicca, not specified as Sjogren's, bilateral: Secondary | ICD-10-CM | POA: Diagnosis not present

## 2020-07-17 DIAGNOSIS — G629 Polyneuropathy, unspecified: Secondary | ICD-10-CM | POA: Diagnosis not present

## 2020-07-17 DIAGNOSIS — Z780 Asymptomatic menopausal state: Secondary | ICD-10-CM | POA: Diagnosis not present

## 2020-07-17 DIAGNOSIS — Z0000 Encounter for general adult medical examination without abnormal findings: Secondary | ICD-10-CM | POA: Diagnosis not present

## 2020-07-17 DIAGNOSIS — E228 Other hyperfunction of pituitary gland: Secondary | ICD-10-CM | POA: Diagnosis not present

## 2020-07-17 DIAGNOSIS — R002 Palpitations: Secondary | ICD-10-CM | POA: Diagnosis not present

## 2020-07-17 DIAGNOSIS — D509 Iron deficiency anemia, unspecified: Secondary | ICD-10-CM | POA: Diagnosis not present

## 2020-07-17 DIAGNOSIS — Z79899 Other long term (current) drug therapy: Secondary | ICD-10-CM | POA: Diagnosis not present

## 2020-07-17 DIAGNOSIS — M8580 Other specified disorders of bone density and structure, unspecified site: Secondary | ICD-10-CM | POA: Diagnosis not present

## 2020-07-17 DIAGNOSIS — K219 Gastro-esophageal reflux disease without esophagitis: Secondary | ICD-10-CM | POA: Diagnosis not present

## 2020-08-28 DIAGNOSIS — Z03818 Encounter for observation for suspected exposure to other biological agents ruled out: Secondary | ICD-10-CM | POA: Diagnosis not present

## 2020-08-31 DIAGNOSIS — Z23 Encounter for immunization: Secondary | ICD-10-CM | POA: Diagnosis not present

## 2020-09-05 DIAGNOSIS — D649 Anemia, unspecified: Secondary | ICD-10-CM | POA: Diagnosis not present

## 2020-09-05 DIAGNOSIS — R000 Tachycardia, unspecified: Secondary | ICD-10-CM | POA: Diagnosis not present

## 2020-09-05 DIAGNOSIS — R0600 Dyspnea, unspecified: Secondary | ICD-10-CM | POA: Diagnosis not present

## 2020-09-05 DIAGNOSIS — Z79899 Other long term (current) drug therapy: Secondary | ICD-10-CM | POA: Diagnosis not present

## 2020-09-05 DIAGNOSIS — R634 Abnormal weight loss: Secondary | ICD-10-CM | POA: Diagnosis not present

## 2020-09-05 DIAGNOSIS — R5383 Other fatigue: Secondary | ICD-10-CM | POA: Diagnosis not present

## 2020-09-19 DIAGNOSIS — E0590 Thyrotoxicosis, unspecified without thyrotoxic crisis or storm: Secondary | ICD-10-CM | POA: Diagnosis not present

## 2020-09-19 DIAGNOSIS — R002 Palpitations: Secondary | ICD-10-CM | POA: Diagnosis not present

## 2020-09-19 DIAGNOSIS — Z79899 Other long term (current) drug therapy: Secondary | ICD-10-CM | POA: Diagnosis not present

## 2020-09-25 DIAGNOSIS — E0590 Thyrotoxicosis, unspecified without thyrotoxic crisis or storm: Secondary | ICD-10-CM | POA: Diagnosis not present

## 2020-10-02 DIAGNOSIS — E0590 Thyrotoxicosis, unspecified without thyrotoxic crisis or storm: Secondary | ICD-10-CM | POA: Diagnosis not present

## 2020-10-02 DIAGNOSIS — R0609 Other forms of dyspnea: Secondary | ICD-10-CM | POA: Diagnosis not present

## 2020-10-02 DIAGNOSIS — R5383 Other fatigue: Secondary | ICD-10-CM | POA: Diagnosis not present

## 2020-10-02 DIAGNOSIS — D649 Anemia, unspecified: Secondary | ICD-10-CM | POA: Diagnosis not present

## 2020-10-04 DIAGNOSIS — E0500 Thyrotoxicosis with diffuse goiter without thyrotoxic crisis or storm: Secondary | ICD-10-CM

## 2020-10-04 DIAGNOSIS — R0602 Shortness of breath: Secondary | ICD-10-CM | POA: Diagnosis not present

## 2020-10-04 HISTORY — DX: Thyrotoxicosis with diffuse goiter without thyrotoxic crisis or storm: E05.00

## 2020-10-08 DIAGNOSIS — E0590 Thyrotoxicosis, unspecified without thyrotoxic crisis or storm: Secondary | ICD-10-CM | POA: Diagnosis not present

## 2020-10-08 DIAGNOSIS — R0600 Dyspnea, unspecified: Secondary | ICD-10-CM | POA: Diagnosis not present

## 2020-10-08 DIAGNOSIS — D649 Anemia, unspecified: Secondary | ICD-10-CM | POA: Diagnosis not present

## 2020-10-10 ENCOUNTER — Telehealth: Payer: Self-pay | Admitting: Oncology

## 2020-10-10 DIAGNOSIS — R0600 Dyspnea, unspecified: Secondary | ICD-10-CM | POA: Diagnosis not present

## 2020-10-10 NOTE — Telephone Encounter (Signed)
Received a new hem referral from Dr. Felipa Eth for anemia. Sara Carson cld to schedule her appt w/dr. Alen Blew on 6/29 at 11am. Pt aware to arrive 15 minutes early.

## 2020-10-12 ENCOUNTER — Emergency Department (HOSPITAL_COMMUNITY): Payer: Medicare Other

## 2020-10-12 ENCOUNTER — Encounter (HOSPITAL_COMMUNITY): Payer: Self-pay

## 2020-10-12 ENCOUNTER — Other Ambulatory Visit: Payer: Self-pay

## 2020-10-12 ENCOUNTER — Emergency Department (HOSPITAL_COMMUNITY)
Admission: EM | Admit: 2020-10-12 | Discharge: 2020-10-13 | Disposition: A | Discharge status: Home / Self Care | Payer: Medicare Other | Attending: Emergency Medicine | Admitting: Emergency Medicine

## 2020-10-12 DIAGNOSIS — N390 Urinary tract infection, site not specified: Secondary | ICD-10-CM | POA: Diagnosis not present

## 2020-10-12 DIAGNOSIS — K760 Fatty (change of) liver, not elsewhere classified: Secondary | ICD-10-CM | POA: Diagnosis not present

## 2020-10-12 DIAGNOSIS — I959 Hypotension, unspecified: Secondary | ICD-10-CM | POA: Diagnosis not present

## 2020-10-12 DIAGNOSIS — R7401 Elevation of levels of liver transaminase levels: Secondary | ICD-10-CM | POA: Diagnosis not present

## 2020-10-12 DIAGNOSIS — E0590 Thyrotoxicosis, unspecified without thyrotoxic crisis or storm: Secondary | ICD-10-CM | POA: Diagnosis not present

## 2020-10-12 DIAGNOSIS — R531 Weakness: Secondary | ICD-10-CM | POA: Diagnosis not present

## 2020-10-12 DIAGNOSIS — R110 Nausea: Secondary | ICD-10-CM | POA: Insufficient documentation

## 2020-10-12 DIAGNOSIS — B9689 Other specified bacterial agents as the cause of diseases classified elsewhere: Secondary | ICD-10-CM | POA: Diagnosis not present

## 2020-10-12 DIAGNOSIS — R799 Abnormal finding of blood chemistry, unspecified: Secondary | ICD-10-CM | POA: Insufficient documentation

## 2020-10-12 DIAGNOSIS — K7689 Other specified diseases of liver: Secondary | ICD-10-CM | POA: Diagnosis not present

## 2020-10-12 DIAGNOSIS — R7989 Other specified abnormal findings of blood chemistry: Secondary | ICD-10-CM

## 2020-10-12 DIAGNOSIS — R7889 Finding of other specified substances, not normally found in blood: Secondary | ICD-10-CM | POA: Diagnosis not present

## 2020-10-12 LAB — CBC WITH DIFFERENTIAL/PLATELET
Abs Immature Granulocytes: 0.01 10*3/uL (ref 0.00–0.07)
Basophils Absolute: 0 10*3/uL (ref 0.0–0.1)
Basophils Relative: 1 %
Eosinophils Absolute: 0.2 10*3/uL (ref 0.0–0.5)
Eosinophils Relative: 5 %
HCT: 31.8 % — ABNORMAL LOW (ref 36.0–46.0)
Hemoglobin: 10.6 g/dL — ABNORMAL LOW (ref 12.0–15.0)
Immature Granulocytes: 0 %
Lymphocytes Relative: 18 %
Lymphs Abs: 0.8 10*3/uL (ref 0.7–4.0)
MCH: 30.5 pg (ref 26.0–34.0)
MCHC: 33.3 g/dL (ref 30.0–36.0)
MCV: 91.4 fL (ref 80.0–100.0)
Monocytes Absolute: 0.6 10*3/uL (ref 0.1–1.0)
Monocytes Relative: 14 %
Neutro Abs: 2.8 10*3/uL (ref 1.7–7.7)
Neutrophils Relative %: 62 %
Platelets: 286 10*3/uL (ref 150–400)
RBC: 3.48 MIL/uL — ABNORMAL LOW (ref 3.87–5.11)
RDW: 12.7 % (ref 11.5–15.5)
WBC: 4.5 10*3/uL (ref 4.0–10.5)
nRBC: 0 % (ref 0.0–0.2)

## 2020-10-12 LAB — COMPREHENSIVE METABOLIC PANEL
ALT: 66 U/L — ABNORMAL HIGH (ref 0–44)
AST: 150 U/L — ABNORMAL HIGH (ref 15–41)
Albumin: 3.3 g/dL — ABNORMAL LOW (ref 3.5–5.0)
Alkaline Phosphatase: 236 U/L — ABNORMAL HIGH (ref 38–126)
Anion gap: 7 (ref 5–15)
BUN: 10 mg/dL (ref 8–23)
CO2: 22 mmol/L (ref 22–32)
Calcium: 8.8 mg/dL — ABNORMAL LOW (ref 8.9–10.3)
Chloride: 104 mmol/L (ref 98–111)
Creatinine, Ser: 0.44 mg/dL (ref 0.44–1.00)
GFR, Estimated: >60 mL/min (ref >60)
Glucose, Bld: 103 mg/dL — ABNORMAL HIGH (ref 70–99)
Potassium: 3.7 mmol/L (ref 3.5–5.1)
Sodium: 133 mmol/L — ABNORMAL LOW (ref 135–145)
Total Bilirubin: 2.5 mg/dL — ABNORMAL HIGH (ref 0.3–1.2)
Total Protein: 6.3 g/dL — ABNORMAL LOW (ref 6.5–8.1)

## 2020-10-12 LAB — TSH: TSH: <0.01 u[IU]/mL — ABNORMAL LOW (ref 0.350–4.500)

## 2020-10-12 LAB — URINALYSIS, ROUTINE W REFLEX MICROSCOPIC
Bilirubin Urine: NEGATIVE
Glucose, UA: NEGATIVE mg/dL
Hgb urine dipstick: NEGATIVE
Ketones, ur: NEGATIVE mg/dL
Nitrite: POSITIVE — AB
Protein, ur: NEGATIVE mg/dL
Specific Gravity, Urine: 1.009 (ref 1.005–1.030)
pH: 5 (ref 5.0–8.0)

## 2020-10-12 LAB — T4, FREE: Free T4: 1.48 ng/dL — ABNORMAL HIGH (ref 0.61–1.12)

## 2020-10-12 IMAGING — US US ABDOMEN LIMITED
1 series · 15 of 25 positions shown · non-contrast
Comparison: None.

CLINICAL DATA: Nausea elevated LFT

EXAM:
ULTRASOUND ABDOMEN LIMITED RIGHT UPPER QUADRANT

[Series 1: us abdomen limited mc & wl · 15 of 56 slices shown]
[im 1/56]
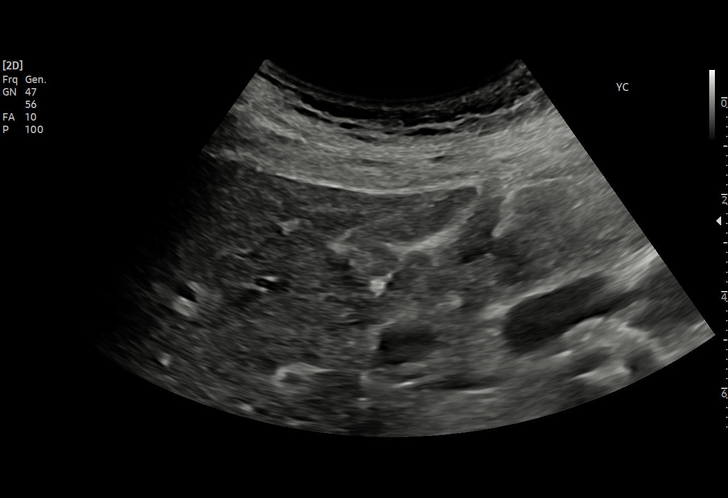
[im 5/56]
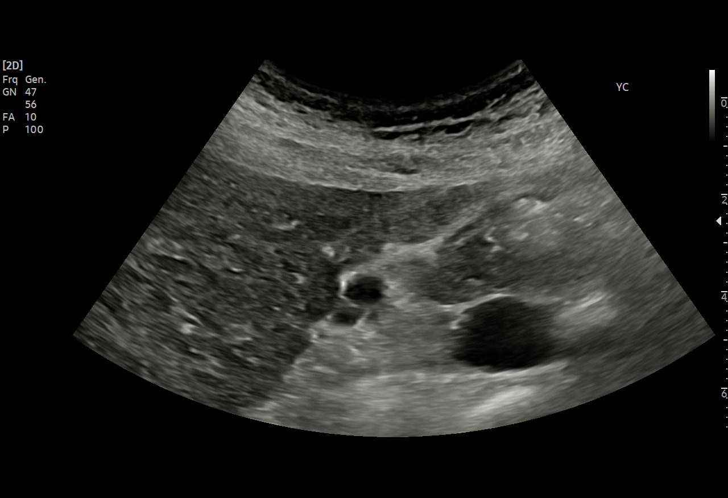
[im 10/56]
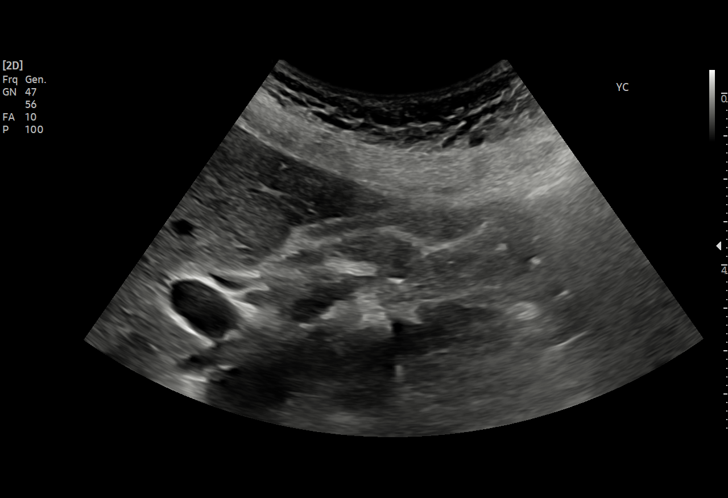
[im 12/56]
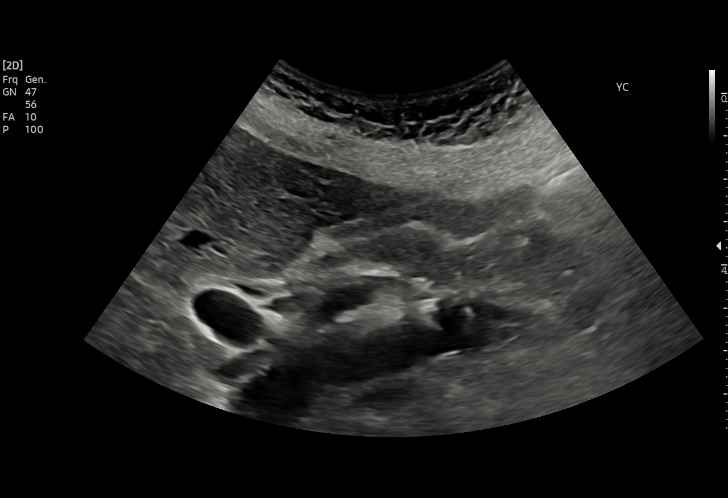
[im 17/56]
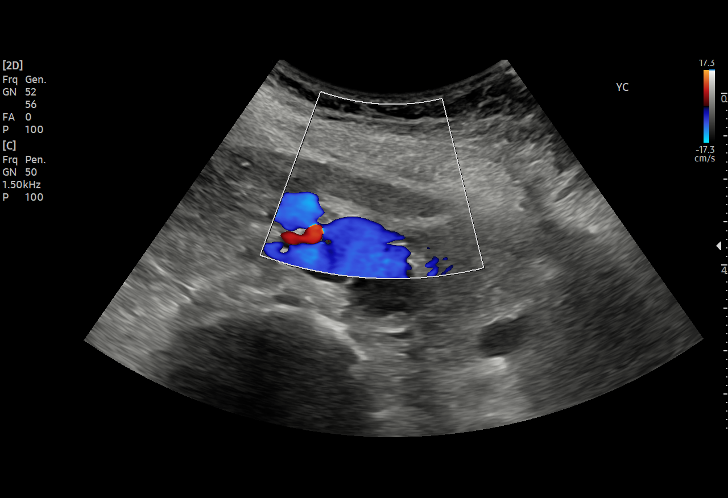
[im 21/56]
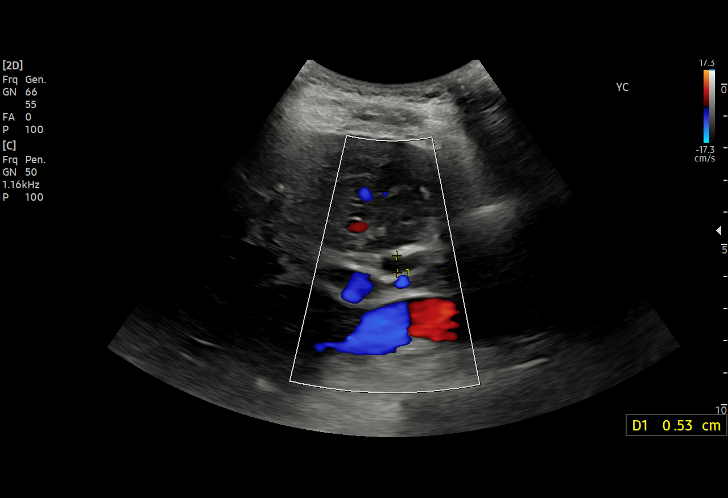
[im 23/56]
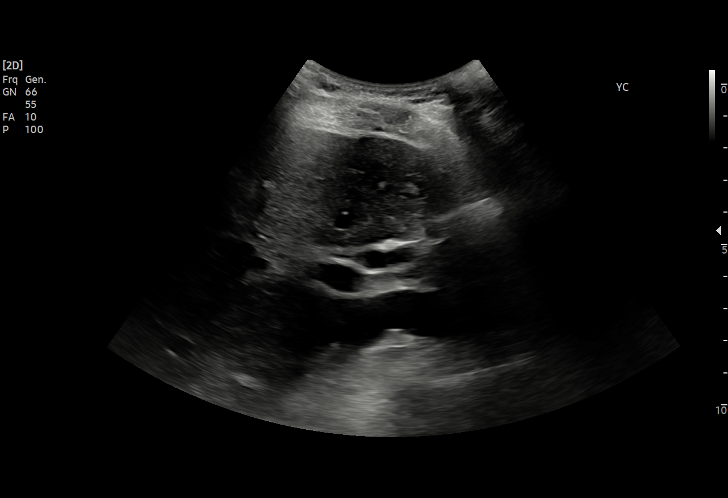
[im 28/56]
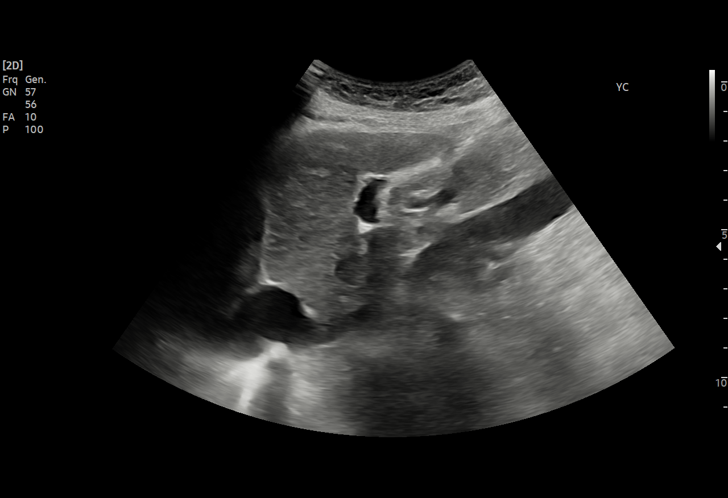
[im 33/56]
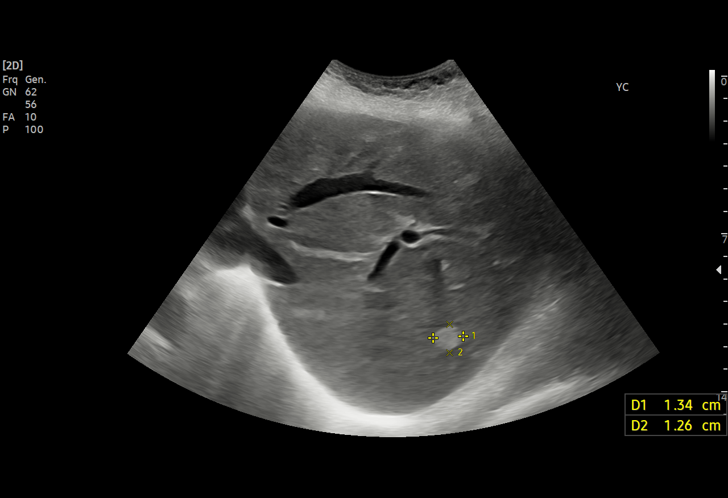
[im 35/56]
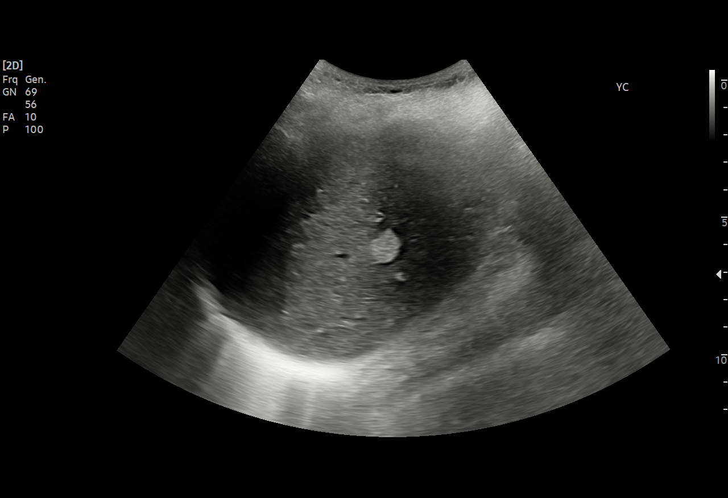
[im 39/56]
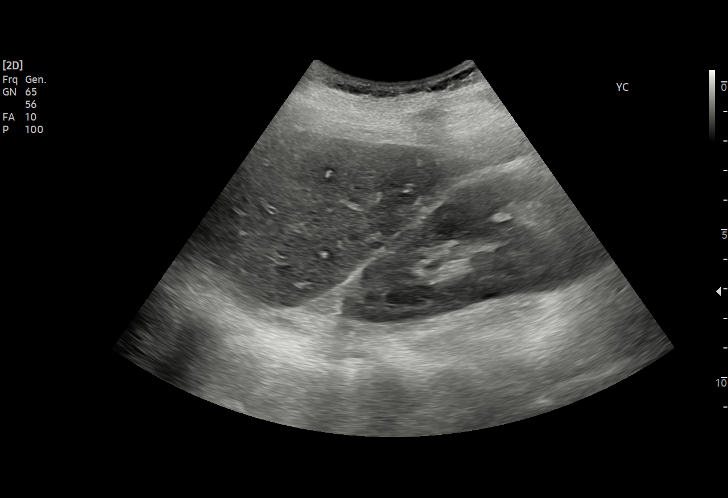
[im 44/56]
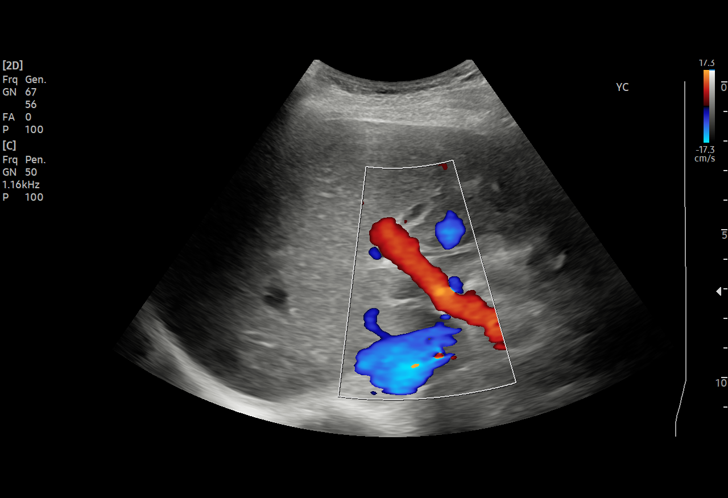
[im 46/56]
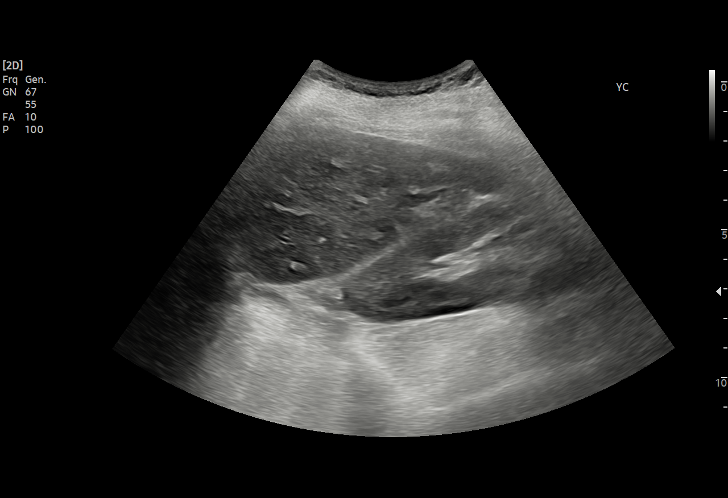
[im 51/56]
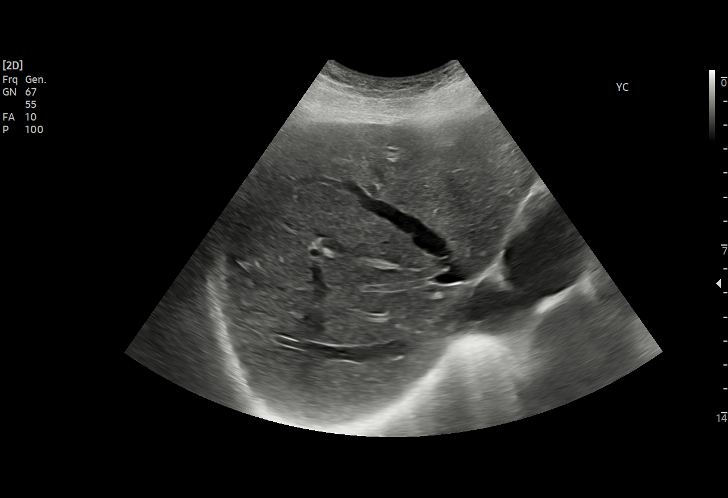
[im 56/56]
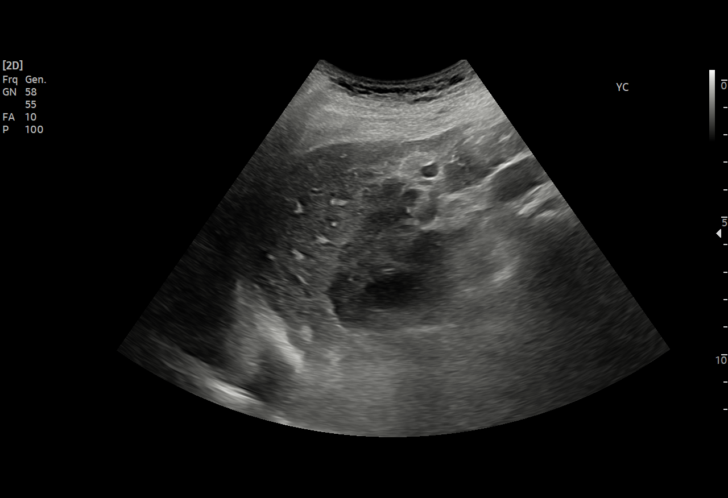

[15 of 25 positions shown; findings below may reference images not displayed]

FINDINGS: Gallbladder:

The gallbladder appears contracted. No shadowing stones. Sludge in
the gallbladder. Normal wall thickness. Negative sonographic Murphy.

Common bile duct:

Diameter: 5.3 mm

Liver:

Echogenic mass in the right hepatic lobe measuring 13 x 13 x 13 mm.
Portal vein is patent on color Doppler imaging with normal direction
of blood flow towards the liver.

Other: None.
IMPRESSION: 1. Contracted gallbladder with sludge but no shadowing stone.
2. 13 mm echogenic mass in the right hepatic lobe suggestive of a
small hemangioma.

## 2020-10-12 MED ORDER — CEPHALEXIN 500 MG PO CAPS: 500.0000 mg | ORAL_CAPSULE | Freq: Two times a day (BID) | ORAL | 0 refills | Status: AC

## 2020-10-12 MED ORDER — ONDANSETRON HCL 4 MG/2ML IJ SOLN
4.0000 mg | Freq: Once | INTRAMUSCULAR | Status: AC
  Administered 2020-10-12: 4 mg via INTRAVENOUS
  Filled 2020-10-12: qty 2

## 2020-10-12 MED ORDER — ONDANSETRON 4 MG PO TBDP: 4.0000 mg | ORAL_TABLET | Freq: Three times a day (TID) | ORAL | 0 refills | Status: DC | PRN

## 2020-10-12 NOTE — ED Provider Notes (Signed)
[place] DEPT Provider Note   CSN: [id number] Arrival date & time: 10/12/20  1657     History Chief Complaint  Patient presents with   Abnormal Lab   Weakness    Sara Carson is a 75 y.o. female.  75 year old female with past medical history below including Graves' disease, anemia, depression who presents with weakness and abnormal labs.  Patient was recently diagnosed with Graves' disease and started on methimazole on 6/1.  She recently saw [facility] endocrinology.  She reports 6 weeks of generalized weakness and a weight loss of 15 pounds over the past month.  She has had progressively worsening nausea making her not want to eat or drink much.  She reports increased thirst and dry mouth.  She has had dark urine and lighter colored stools over the last few days.  She also notes diffuse itching of her skin over the last few days.  Her endocrinologist instructed her to stop taking the methimazole last night.  She saw PCP and was told she had elevated liver enzymes, instructed to come to the ED for evaluation.  She denies any vomiting or abdominal pain.  Mild dysuria.  The history is provided by the patient.  Abnormal Lab Weakness     Past Medical History:  Diagnosis Date   Anemia    Depression    Graves disease 10/04/2020   Shingles     There are no problems to display for this patient.   Past Surgical History:  Procedure Laterality Date   ABDOMINAL HYSTERECTOMY     TONSILLECTOMY       OB History   No obstetric history on file.     Family History  Problem Relation Age of Onset   Cancer Mother    Diabetes Father    Hyperlipidemia Father    Hypertension Father     Social History   Tobacco Use   Smoking status: Never   Smokeless tobacco: Never  Vaping Use   Vaping Use: Never used  Substance Use Topics   Alcohol use: Not Currently    Alcohol/week: 3.0 standard drinks    Types: 3 Glasses of wine per week   Drug use:  Never    Home Medications Prior to Admission medications   Medication Sig Start Date End Date Taking? Authorizing Provider  cephALEXin (KEFLEX) 500 MG capsule Take 1 capsule (500 mg total) by mouth 2 (two) times daily for 7 days. 10/12/20 10/19/20 Yes Joanann Mies, Wenda Overland, MD  ondansetron (ZOFRAN ODT) 4 MG disintegrating tablet Take 1 tablet (4 mg total) by mouth every 8 (eight) hours as needed for nausea or vomiting. 10/12/20  Yes Usher Hedberg, Wenda Overland, MD  calcium citrate (CALCITRATE - DOSED IN MG ELEMENTAL CALCIUM) 950 MG tablet Take 200 mg of elemental calcium by mouth daily.    [provider]  cholecalciferol (VITAMIN D3) 25 MCG (1000 UNIT) tablet Take 1,000 Units by mouth daily.    [provider]  citalopram (CELEXA) 10 MG tablet Take 10 mg by mouth daily. 10/01/19   [provider]  Multiple Vitamin (MULTIVITAMIN) tablet Take 1 tablet by mouth daily.    [provider]  vitamin C (ASCORBIC ACID) 500 MG tablet Take 500 mg by mouth daily.    [provider]  gabapentin (NEURONTIN) 300 MG capsule Day 1: 300 mg, Day 2: 300 mg twice daily, Day 3: 300 mg 3 times daily 01/29/14 10/21/19  Leandrew Koyanagi, MD    Allergies  Amoxicillin and Levaquin [levofloxacin in d5w]  Review of Systems   Review of Systems  Neurological:  Positive for weakness.  All other systems reviewed and are negative except that which was mentioned in HPI  Physical Exam Updated Vital Signs BP (!) 152/71   Pulse 70   Temp 97.6 F (36.4 C) (Oral)   Resp 14   Ht 5' 2.5" (1.588 m)   Wt 51.3 kg   SpO2 97%   BMI 20.37 kg/m   Physical Exam Vitals and nursing note reviewed.  Constitutional:      General: She is not in acute distress.    Appearance: Normal appearance.  HENT:     Head: Normocephalic and atraumatic.     Mouth/Throat:     Mouth: Mucous membranes are dry.  Eyes:     Conjunctiva/sclera: Conjunctivae normal.  Cardiovascular:     Rate and Rhythm:  Normal rate.     Heart sounds: Normal heart sounds. No murmur heard.    Comments: Occasional irregular beat Pulmonary:     Effort: Pulmonary effort is normal.     Breath sounds: Normal breath sounds.  Abdominal:     General: Abdomen is flat. Bowel sounds are normal. There is no distension.     Palpations: Abdomen is soft.     Tenderness: There is no abdominal tenderness.  Musculoskeletal:     Right lower leg: No edema.     Left lower leg: No edema.  Skin:    General: Skin is warm and dry.  Neurological:     Mental Status: She is alert and oriented to person, place, and time.     Comments: fluent  Psychiatric:        Mood and Affect: Mood normal.        Behavior: Behavior normal.    ED Results / Procedures / Treatments   Labs (all labs ordered are listed, but only abnormal results are displayed) Labs Reviewed  COMPREHENSIVE METABOLIC PANEL - Abnormal; Notable for the following components:      Result Value   Sodium 133 (*)    Glucose, Bld 103 (*)    Calcium 8.8 (*)    Total Protein 6.3 (*)    Albumin 3.3 (*)    AST 150 (*)    ALT 66 (*)    Alkaline Phosphatase 236 (*)    Total Bilirubin 2.5 (*)    All other components within normal limits  CBC WITH DIFFERENTIAL/PLATELET - Abnormal; Notable for the following components:   RBC 3.48 (*)    Hemoglobin 10.6 (*)    HCT 31.8 (*)    All other components within normal limits  URINALYSIS, ROUTINE W REFLEX MICROSCOPIC - Abnormal; Notable for the following components:   Color, Urine AMBER (*)    APPearance HAZY (*)    Nitrite POSITIVE (*)    Leukocytes,Ua MODERATE (*)    Bacteria, UA RARE (*)    All other components within normal limits  TSH - Abnormal; Notable for the following components:   TSH <0.010 (*)    All other components within normal limits  T4, FREE    EKG None  Radiology US Abdomen Limited  Result Date: 10/12/2020 CLINICAL DATA:  Nausea elevated LFT EXAM: ULTRASOUND ABDOMEN LIMITED RIGHT UPPER QUADRANT  COMPARISON:  None. FINDINGS: Gallbladder: The gallbladder appears contracted. No shadowing stones. Sludge in the gallbladder. Normal wall thickness. Negative sonographic Murphy. Common bile duct: Diameter: 5.3 mm Liver: Echogenic mass in the right hepatic lobe measuring 13  x 13 x 13 mm. Portal vein is patent on color Doppler imaging with normal direction of blood flow towards the liver. Other: None. IMPRESSION: 1. Contracted gallbladder with sludge but no shadowing stone. 2. 13 mm echogenic mass in the right hepatic lobe suggestive of a small hemangioma. Electronically Signed   By: Donavan Foil M.D.   On: 10/12/2020 22:25    Procedures Procedures   Medications Ordered in ED Medications  ondansetron (ZOFRAN) injection 4 mg (4 mg Intravenous Given 10/12/20 2037)    ED Course  I have reviewed the triage vital signs and the nursing notes.  Pertinent labs & imaging results that were available during my care of the patient were reviewed by me and considered in my medical decision making (see chart for details).    MDM Rules/Calculators/A&P                          Alert, non-toxic on exam, reassuring VS, no jaundice or tremulousness on exam. Labs notable for UA c/w infection; TSH <0.01; AST 150, ALT 66, alk phos 236, tbili 2.5, normal WBC count, Hgb 10.6. gallbladder US shows no stones or cholecystitis.   I discussed w/u with [facility] endocrinologist Dr. Pablo Ledger. She recommended increasing atenolol and continuing to hold methimazole for now.  Their clinic will contact the patient on Monday morning to discuss further treatment options.  Patient is otherwise well-appearing here and I have provided with Zofran to use as needed at home as well as course of Keflex to treat UTI.  I extensively reviewed return precautions with her and she voiced understanding. Final Clinical Impression(s) / ED Diagnoses Final diagnoses:  Elevated LFTs  Urinary tract infection without hematuria, site unspecified    Rx /  DC Orders ED Discharge Orders          Ordered    ondansetron (ZOFRAN ODT) 4 MG disintegrating tablet  Every 8 hours PRN        10/12/20 2345    cephALEXin (KEFLEX) 500 MG capsule  2 times daily        10/12/20 2345             Jaziah Goeller, Wenda Overland, MD 10/12/20 2350

## 2020-10-12 NOTE — Discharge Instructions (Addendum)
Please double your dose of atenolol, taking 50mg  twice daily.  Take antibiotics as prescribed.   Follow-up with the endocrinology clinic this week.  Return to ER if you have any fever, abdominal pain, significant vomiting, or worsening symptoms.

## 2020-10-12 NOTE — ED Notes (Signed)
Dr. Rex Kras made aware via epic message that "pt asking for nausea medication."

## 2020-10-12 NOTE — ED Triage Notes (Signed)
Per EMS pt has Graves disease. Pt had elevated liver labs and thyrotropin level. Sent her by PCP.  Pt reports ongoing weakness for about 6 weeks. Pt also reports nausea, thirst, dark urine, decreased appetite.   VS BP 138/58, HR 76, RR 20, CBG 143 (pt reports this is abnormal for her.

## 2020-10-17 ENCOUNTER — Inpatient Hospital Stay: Payer: Medicare Other | Attending: Oncology | Admitting: Oncology

## 2020-10-17 ENCOUNTER — Other Ambulatory Visit: Payer: Self-pay

## 2020-10-17 VITALS — BP 139/74 | HR 79 | Temp 99.1°F | Resp 17 | Ht 62.5 in | Wt 113.9 lb

## 2020-10-17 DIAGNOSIS — Z809 Family history of malignant neoplasm, unspecified: Secondary | ICD-10-CM

## 2020-10-17 DIAGNOSIS — D649 Anemia, unspecified: Secondary | ICD-10-CM | POA: Diagnosis not present

## 2020-10-17 DIAGNOSIS — Z8349 Family history of other endocrine, nutritional and metabolic diseases: Secondary | ICD-10-CM | POA: Diagnosis not present

## 2020-10-17 DIAGNOSIS — Z833 Family history of diabetes mellitus: Secondary | ICD-10-CM

## 2020-10-17 DIAGNOSIS — Z7289 Other problems related to lifestyle: Secondary | ICD-10-CM

## 2020-10-17 DIAGNOSIS — Z8249 Family history of ischemic heart disease and other diseases of the circulatory system: Secondary | ICD-10-CM

## 2020-10-17 NOTE — Progress Notes (Signed)
Reason for the request:   Anemia  HPI: I was asked by Dr. Felipa Eth to evaluate Sara Carson the evaluation of anemia.  She is a 75 year old woman without any significant comorbid condition that recently diagnosed with hyperthyroidism.  She presented with TSH of less than 0.05 with free T4 of 4.1.  She was started on methimazole by endocrinology and repeat testing showed improvement in her thyroid function but elevation in her liver function test.  She was seen in the emergency department on October 12, 2020 with AST of 150, ALT of 66 and bilirubin of 2.5.  At that time her hemoglobin was 10.6 with previous counts ranged between 11.4 on October 12, 2020, 9.6 on June 14 and 10.5 on June 1.  Her MCV is normal with normal kidney function.  Clinically, she has reported feeling poorly in the last month predominantly nausea, vomiting weight loss and palpitation predominantly after the start of methimazole.  She has felt better with more energy and improvement after the discontinuation of her medication.  She is able to drive and attends to activities of daily living at this time.     She does not report any headaches, blurry vision, syncope or seizures. Does not report any fevers, chills or sweats.  Does not report any cough, wheezing or hemoptysis.  Does not report any chest pain, palpitation, orthopnea or leg edema.  Does not report any nausea, vomiting or abdominal pain.  Does not report any constipation or diarrhea.  Does not report any skeletal complaints.    Does not report frequency, urgency or hematuria.  Does not report any skin rashes or lesions. Does not report any heat or cold intolerance.  Does not report any lymphadenopathy or petechiae.  Does not report any anxiety or depression.  Remaining review of systems is negative.     Past Medical History:  Diagnosis Date   Anemia    Depression    Graves disease 10/04/2020   Shingles   :   Past Surgical History:  Procedure Laterality Date   ABDOMINAL  HYSTERECTOMY     TONSILLECTOMY    :   Current Outpatient Medications:    calcium citrate (CALCITRATE - DOSED IN MG ELEMENTAL CALCIUM) 950 MG tablet, Take 200 mg of elemental calcium by mouth daily., Disp: , Rfl:    cephALEXin (KEFLEX) 500 MG capsule, Take 1 capsule (500 mg total) by mouth 2 (two) times daily for 7 days., Disp: 14 capsule, Rfl: 0   cholecalciferol (VITAMIN D3) 25 MCG (1000 UNIT) tablet, Take 1,000 Units by mouth daily., Disp: , Rfl:    citalopram (CELEXA) 10 MG tablet, Take 10 mg by mouth daily., Disp: , Rfl:    Multiple Vitamin (MULTIVITAMIN) tablet, Take 1 tablet by mouth daily., Disp: , Rfl:    ondansetron (ZOFRAN ODT) 4 MG disintegrating tablet, Take 1 tablet (4 mg total) by mouth every 8 (eight) hours as needed for nausea or vomiting., Disp: 8 tablet, Rfl: 0   vitamin C (ASCORBIC ACID) 500 MG tablet, Take 500 mg by mouth daily., Disp: , Rfl: :   Allergies  Allergen Reactions   Amoxicillin    Levaquin [Levofloxacin In D5w]   :   Family History  Problem Relation Age of Onset   Cancer Mother    Diabetes Father    Hyperlipidemia Father    Hypertension Father   :   Social History   Socioeconomic History   Marital status: Widowed    Spouse name: Not on file  Number of children: Not on file   Years of education: Not on file   Highest education level: Not on file  Occupational History   Not on file  Tobacco Use   Smoking status: Never   Smokeless tobacco: Never  Vaping Use   Vaping Use: Never used  Substance and Sexual Activity   Alcohol use: Not Currently    Alcohol/week: 3.0 standard drinks    Types: 3 Glasses of wine per week   Drug use: Never   Sexual activity: Not on file  Other Topics Concern   Not on file  Social History Narrative   Not on file   Social Determinants of Health   Financial Resource Strain: Not on file  Food Insecurity: Not on file  Transportation Needs: Not on file  Physical Activity: Not on file  Stress: Not on file   Social Connections: Not on file  Intimate Partner Violence: Not on file  :  Pertinent items are noted in HPI.  Exam: Blood pressure 139/74, pulse 79, temperature 99.1 F (37.3 C), temperature source Tympanic, resp. rate 17, height 5' 2.5" (1.588 m), weight 113 lb 14.4 oz (51.7 kg), SpO2 100 %. ECOG 1 General appearance: alert and cooperative appeared without distress. Head: atraumatic without any abnormalities. Eyes: conjunctivae/corneas clear. PERRL.  Sclera anicteric. Throat: lips, mucosa, and tongue normal; without oral thrush or ulcers. Resp: clear to auscultation bilaterally without rhonchi, wheezes or dullness to percussion. Cardio: regular rate and rhythm, S1, S2 normal, no murmur, click, rub or gallop GI: soft, non-tender; bowel sounds normal; no masses,  no organomegaly Skin: Skin color, texture, turgor normal. No rashes or lesions Lymph nodes: Cervical, supraclavicular, and axillary nodes normal. Neurologic: Grossly normal without any motor, sensory or deep tendon reflexes. Musculoskeletal: No joint deformity or effusion.    US Abdomen Limited  Result Date: 10/12/2020 CLINICAL DATA:  Nausea elevated LFT EXAM: ULTRASOUND ABDOMEN LIMITED RIGHT UPPER QUADRANT COMPARISON:  None. FINDINGS: Gallbladder: The gallbladder appears contracted. No shadowing stones. Sludge in the gallbladder. Normal wall thickness. Negative sonographic Murphy. Common bile duct: Diameter: 5.3 mm Liver: Echogenic mass in the right hepatic lobe measuring 13 x 13 x 13 mm. Portal vein is patent on color Doppler imaging with normal direction of blood flow towards the liver. Other: None. IMPRESSION: 1. Contracted gallbladder with sludge but no shadowing stone. 2. 13 mm echogenic mass in the right hepatic lobe suggestive of a small hemangioma. Electronically Signed   By: Donavan Foil M.D.   On: 10/12/2020 22:25    Assessment and Plan:    75 year old woman with:  1.  Normocytic, normochromic anemia noted in  June 2022 with hemoglobin ranged between 10 and 11 with normal white cell count and platelet count.  The differential diagnosis was reviewed at this time and treatment choices were discussed.  Anemia of chronic disease remains the most likely contributing factor at this time.  She does have history of hypothyroidism which can results and anemia of chronic disease.  Other considerations such as iron deficiency, B12 deficiency, plasma cell disorder, myelodysplastic syndrome are considered less likely.  For the time being, I recommended a period of observation and then titrating her thyroid treatment first and we will update her anemia work-up in the future as needed  I do not see the need for any growth factor support or transfusion.  She does not require a bone marrow biopsy at this time.   2.  Follow-up: I am happy to see her in the  future as needed   45  minutes were dedicated to this visit. The time was spent on reviewing laboratory data, discussing treatment options, discussing differential diagnosis and answering questions regarding future plan.    A copy of this consult has been forwarded to the requesting physician.

## 2020-10-19 ENCOUNTER — Ambulatory Visit (INDEPENDENT_AMBULATORY_CARE_PROVIDER_SITE_OTHER): Payer: Medicare Other | Admitting: Endocrinology

## 2020-10-19 ENCOUNTER — Encounter: Payer: Self-pay | Admitting: Endocrinology

## 2020-10-19 ENCOUNTER — Other Ambulatory Visit: Payer: Self-pay

## 2020-10-19 DIAGNOSIS — R7401 Elevation of levels of liver transaminase levels: Secondary | ICD-10-CM | POA: Diagnosis not present

## 2020-10-19 DIAGNOSIS — E0590 Thyrotoxicosis, unspecified without thyrotoxic crisis or storm: Secondary | ICD-10-CM | POA: Insufficient documentation

## 2020-10-19 MED ORDER — ATENOLOL 25 MG PO TABS: 25.0000 mg | ORAL_TABLET | Freq: Two times a day (BID) | ORAL | 3 refills | Status: DC

## 2020-10-19 NOTE — Patient Instructions (Signed)
let's check a thyroid "scan" (a special, but easy and painless type of thyroid x ray).  It works like this: you go to the x-ray department of the hospital to swallow a pill, which contains a miniscule amount of radiation.  You will not notice any symptoms from this.  You will go back to the x-ray department the next day, to lie down in front of a camera.  The results of this will be sent to me.   Based on the results, i hope to order for you a treatment pill of radioactive iodine.  Although it is a larger amount of radiation, you will again notice no symptoms from this.  The pill is gone from your body in a few days (during which you should stay away from other people), but takes several months to work.  Therefore, please return here approximately 6-8 weeks after the treatment.  This treatment has been available for many years, and the only known side-effect is an underactive thyroid.  It is possible that i would eventually prescribe for you a thyroid hormone pill, which is very inexpensive.  You don't have to worry about side-effects of this thyroid hormone pill, because it is the same molecule your thyroid makes. Please continue the same atenolol.

## 2020-10-19 NOTE — Progress Notes (Signed)
Subjective:    Patient ID: Sara Carson, female    DOB: 14-Oct-1945, 75 y.o.   MRN: [id number]  HPI Pt is referred by Dr Felipa Eth, for hyperthyroidism.  Pt reports he was dx'ed with hyperthyroidism in 2022.  she did not tolerate tapazole (elev transaminases, nausea, and itching) she has never had XRT to the anterior neck, or thyroid surgery.  she does not consume kelp or any other non-prescribed thyroid medication.  she has never been on amiodarone.  Pt reports doe, heat intolerance, tremor, sweating, difficulty with concentration, and palpitations.  Sxs are better on atenolol.   She has lost 15 lbs.  She [facility] this AM.   Past Medical History:  Diagnosis Date   Anemia    Depression    Graves disease 10/04/2020   Shingles     Past Surgical History:  Procedure Laterality Date   ABDOMINAL HYSTERECTOMY     TONSILLECTOMY      Social History   Socioeconomic History   Marital status: Widowed    Spouse name: Not on file   Number of children: Not on file   Years of education: Not on file   Highest education level: Not on file  Occupational History   Not on file  Tobacco Use   Smoking status: Never   Smokeless tobacco: Never  Vaping Use   Vaping Use: Never used  Substance and Sexual Activity   Alcohol use: Not Currently    Alcohol/week: 3.0 standard drinks    Types: 3 Glasses of wine per week   Drug use: Never   Sexual activity: Not on file  Other Topics Concern   Not on file  Social History Narrative   Not on file   Social Determinants of Health   Financial Resource Strain: Not on file  Food Insecurity: Not on file  Transportation Needs: Not on file  Physical Activity: Not on file  Stress: Not on file  Social Connections: Not on file  Intimate Partner Violence: Not on file    Current Outpatient Medications on File Prior to Visit  Medication Sig Dispense Refill   calcium citrate (CALCITRATE - DOSED IN MG ELEMENTAL CALCIUM) 950 MG tablet Take 200 mg of  elemental calcium by mouth daily.     cholecalciferol (VITAMIN D3) 25 MCG (1000 UNIT) tablet Take 1,000 Units by mouth daily.     citalopram (CELEXA) 10 MG tablet Take 10 mg by mouth daily.     Multiple Vitamin (MULTIVITAMIN) tablet Take 1 tablet by mouth daily.     vitamin C (ASCORBIC ACID) 500 MG tablet Take 500 mg by mouth daily.     ondansetron (ZOFRAN ODT) 4 MG disintegrating tablet Take 1 tablet (4 mg total) by mouth every 8 (eight) hours as needed for nausea or vomiting. 8 tablet 0   [DISCONTINUED] gabapentin (NEURONTIN) 300 MG capsule Day 1: 300 mg, Day 2: 300 mg twice daily, Day 3: 300 mg 3 times daily 90 capsule 1   No current facility-administered medications on file prior to visit.    Allergies  Allergen Reactions   Amoxicillin    Levaquin [Levofloxacin In D5w]     Family History  Problem Relation Age of Onset   Cancer Mother    Diabetes Father    Hyperlipidemia Father    Hypertension Father    Thyroid disease Neg Hx     BP 128/62 (BP Location: Left Arm, Patient Position: Sitting, Cuff Size: Normal)   Pulse 76  Ht 5' 2.5" (1.588 m)   Wt 113 lb 9.6 oz (51.5 kg)   SpO2 99%   BMI 20.45 kg/m    Review of Systems denies anxiety.     Objective:   Physical Exam VS: see vs page GEN: no distress HEAD: head: no deformity eyes: no periorbital swelling, no proptosis external nose and ears are normal NECK: supple, thyroid is not enlarged CHEST WALL: no deformity LUNGS: clear to auscultation CV: reg rate and rhythm, no murmur.  MUSCULOSKELETAL: gait is normal and steady EXTEMITIES: no deformity.  no leg edema.   NEURO:  readily moves all 4's.  sensation is intact to touch on all 4's.  Not diaphoretic.   SKIN:  Normal texture and temperature.  No rash or suspicious lesion is visible.  Minimal tremor of the hands.   NODES:  None palpable at the neck.   PSYCH: alert, well-oriented.  Does not appear anxious nor depressed.    Lab Results  Component Value Date    TSH <0.010 (L) 10/12/2020   [place]: No evidence of concerning thyroid nodules.  I have reviewed outside records, and summarized: Pt was noted to have low TSH, and referred here.  She was scheduled for nuc med scan, but no appt was available soon.       Assessment & Plan:  Hyperthyroidism, new to me.  uncontrolled   Patient Instructions  let's check a thyroid "scan" (a special, but easy and painless type of thyroid x ray).  It works like this: you go to the x-ray department of the hospital to swallow a pill, which contains a miniscule amount of radiation.  You will not notice any symptoms from this.  You will go back to the x-ray department the next day, to lie down in front of a camera.  The results of this will be sent to me.   Based on the results, i hope to order for you a treatment pill of radioactive iodine.  Although it is a larger amount of radiation, you will again notice no symptoms from this.  The pill is gone from your body in a few days (during which you should stay away from other people), but takes several months to work.  Therefore, please return here approximately 6-8 weeks after the treatment.  This treatment has been available for many years, and the only known side-effect is an underactive thyroid.  It is possible that i would eventually prescribe for you a thyroid hormone pill, which is very inexpensive.  You don't have to worry about side-effects of this thyroid hormone pill, because it is the same molecule your thyroid makes. Please continue the same atenolol.

## 2020-10-24 ENCOUNTER — Telehealth: Payer: Self-pay | Admitting: Endocrinology

## 2020-10-24 NOTE — Telephone Encounter (Signed)
Patient called to let Dr. Loanne Drilling know that she will not be returning, she will be staying with [facility] for treatment

## 2020-10-26 DIAGNOSIS — R7401 Elevation of levels of liver transaminase levels: Secondary | ICD-10-CM | POA: Diagnosis not present

## 2020-10-26 DIAGNOSIS — E0590 Thyrotoxicosis, unspecified without thyrotoxic crisis or storm: Secondary | ICD-10-CM | POA: Diagnosis not present

## 2020-10-30 DIAGNOSIS — E0590 Thyrotoxicosis, unspecified without thyrotoxic crisis or storm: Secondary | ICD-10-CM | POA: Diagnosis not present

## 2020-11-09 DIAGNOSIS — E0590 Thyrotoxicosis, unspecified without thyrotoxic crisis or storm: Secondary | ICD-10-CM | POA: Diagnosis not present

## 2020-11-14 DIAGNOSIS — E0590 Thyrotoxicosis, unspecified without thyrotoxic crisis or storm: Secondary | ICD-10-CM | POA: Diagnosis not present

## 2020-11-28 DIAGNOSIS — D508 Other iron deficiency anemias: Secondary | ICD-10-CM | POA: Diagnosis not present

## 2020-11-28 DIAGNOSIS — I272 Pulmonary hypertension, unspecified: Secondary | ICD-10-CM | POA: Diagnosis not present

## 2020-11-28 DIAGNOSIS — E0590 Thyrotoxicosis, unspecified without thyrotoxic crisis or storm: Secondary | ICD-10-CM | POA: Diagnosis not present

## 2020-12-11 ENCOUNTER — Ambulatory Visit: Payer: PRIVATE HEALTH INSURANCE | Admitting: Endocrinology

## 2020-12-14 ENCOUNTER — Ambulatory Visit (INDEPENDENT_AMBULATORY_CARE_PROVIDER_SITE_OTHER): Payer: Medicare Other | Admitting: Cardiovascular Disease

## 2020-12-14 ENCOUNTER — Other Ambulatory Visit: Payer: Self-pay

## 2020-12-14 ENCOUNTER — Encounter: Payer: Self-pay | Admitting: Cardiovascular Disease

## 2020-12-14 VITALS — BP 140/60 | HR 75 | Ht 62.5 in | Wt 110.4 lb

## 2020-12-14 DIAGNOSIS — H04123 Dry eye syndrome of bilateral lacrimal glands: Secondary | ICD-10-CM | POA: Diagnosis not present

## 2020-12-14 DIAGNOSIS — R7401 Elevation of levels of liver transaminase levels: Secondary | ICD-10-CM | POA: Diagnosis not present

## 2020-12-14 DIAGNOSIS — I340 Nonrheumatic mitral (valve) insufficiency: Secondary | ICD-10-CM | POA: Diagnosis not present

## 2020-12-14 DIAGNOSIS — I272 Pulmonary hypertension, unspecified: Secondary | ICD-10-CM | POA: Diagnosis not present

## 2020-12-14 DIAGNOSIS — E0590 Thyrotoxicosis, unspecified without thyrotoxic crisis or storm: Secondary | ICD-10-CM

## 2020-12-14 DIAGNOSIS — I2510 Atherosclerotic heart disease of native coronary artery without angina pectoris: Secondary | ICD-10-CM

## 2020-12-14 DIAGNOSIS — I2584 Coronary atherosclerosis due to calcified coronary lesion: Secondary | ICD-10-CM

## 2020-12-14 DIAGNOSIS — R9431 Abnormal electrocardiogram [ECG] [EKG]: Secondary | ICD-10-CM

## 2020-12-14 NOTE — Patient Instructions (Signed)
Medication Instructions:   NO CHANGES    Lab Work:  NOT NEEDED   Testing/Procedures: [facility] has requested that you have an echocardiogram. Echocardiography is a painless test that uses sound waves to create images of your heart. It provides your doctor with information about the size and shape of your heart and how well your heart's chambers and valves are working. This procedure takes approximately one hour. There are no restrictions for this procedure.    Follow-Up: At [facility], you and your health needs are our priority.  As part of our continuing mission to provide you with exceptional heart care, we have created designated Provider Care Teams.  These Care Teams include your primary Cardiologist (physician) and Advanced Practice Providers (APPs -  Physician Assistants and Nurse Practitioners) who all work together to provide you with the care you need, when you need it.     Your next appointment:   2 month(s)  The format for your next appointment:   In Person  Provider:   Shelva Majestic, MD

## 2020-12-14 NOTE — Progress Notes (Addendum)
Cardiology Office Note    Date:  12/21/2020   ID:  Sara Carson, DOB 06-12-1945, MRN [id number]  PCP:  Merlene Laughter, MD  Cardiologist:  Nicki Guadalajara, MD   New cardiology evaluation referred by Dr. Pete Glatter for possible pulmonary hypertension.   History of Present Illness:  Sara Carson is a 75 y.o. female who is followed by Dr. Pete Glatter.  States that she was in her usual health until approximately 5 days after she took her second COVID booster this year.  Approximately 5 days later she started to notice increased heart rate currently was diagnosed with hyperthyroidism in late May 2022.  On presentation in addition to her tachycardia, she had weakness, and weight loss. She apparently was seen by Dr. Romero Belling and did not tolerate Tapazole secondary to elevated transaminases nausea and itching.  She ultimately went to [facility] and received radioactive iodine treatment on November 14, 2020.  Following therapy her free T4 has decreased from 4.12 most recently at 1.7.  With her palpitations which initiated in May she was started on atenolol with improvement in her heart rate and she presently continues to take 25 mg twice a day.  June 22, she had an echo Doppler study done at [facility] physicians and the report normal LV cavity size with normal systolic and diastolic function.  Her left atrium was moderately dilated.  Her right ventricle was borderline dilated and there was mild dilatation of her right atrium.  Her mitral valve was structurally normal and there was mild mitral regurgitation.  She was estimated to have possible moderate pulmonary hypertension.  Because of pulmonary hypertension on echo cardiographic assessment, she is now referred by Dr. Pete Glatter for cardiology evaluation.  Presently she feels improved with reference to her previous sudden development of hyperthyroidism and Graves' disease.  Previous weakness and nausea has resolved.  She is unaware of any significant palpitations,  presyncope or syncope.  She denies any chest tightness, PND, or orthopnea.  She presents for evaluation.  Past Medical History:  Diagnosis Date   Anemia    Depression    Graves disease 10/04/2020   Shingles     Past Surgical History:  Procedure Laterality Date   ABDOMINAL HYSTERECTOMY     TONSILLECTOMY      Current Medications: Outpatient Medications Prior to Visit  Medication Sig Dispense Refill   atenolol (TENORMIN) 25 MG tablet Take 1 tablet (25 mg total) by mouth 2 (two) times daily. 180 tablet 3   calcium citrate (CALCITRATE - DOSED IN MG ELEMENTAL CALCIUM) 950 MG tablet Take 200 mg of elemental calcium by mouth daily.     cholecalciferol (VITAMIN D3) 25 MCG (1000 UNIT) tablet Take 1,000 Units by mouth daily.     citalopram (CELEXA) 10 MG tablet Take 10 mg by mouth daily.     ferrous gluconate (FERGON) 240 (27 FE) MG tablet Take 27 mg by mouth daily.     Multiple Vitamin (MULTIVITAMIN) tablet Take 1 tablet by mouth daily.     selenium 200 MCG TABS tablet Take 200 mcg by mouth daily.     vitamin C (ASCORBIC ACID) 500 MG tablet Take 500 mg by mouth daily.     ondansetron (ZOFRAN ODT) 4 MG disintegrating tablet Take 1 tablet (4 mg total) by mouth every 8 (eight) hours as needed for nausea or vomiting. 8 tablet 0   No facility-administered medications prior to visit.     Allergies:   Methimazole, Amoxicillin, and Levaquin [levofloxacin in  d5w]   Social History   Socioeconomic History   Marital status: Widowed    Spouse name: Not on file   Number of children: Not on file   Years of education: Not on file   Highest education level: Not on file  Occupational History   Not on file  Tobacco Use   Smoking status: Never   Smokeless tobacco: Never  Vaping Use   Vaping Use: Never used  Substance and Sexual Activity   Alcohol use: Not Currently    Alcohol/week: 3.0 standard drinks    Types: 3 Glasses of wine per week   Drug use: Never   Sexual activity: Not on file   Other Topics Concern   Not on file  Social History Narrative   Not on file   Social Determinants of Health   Financial Resource Strain: Not on file  Food Insecurity: Not on file  Transportation Needs: Not on file  Physical Activity: Not on file  Stress: Not on file  Social Connections: Not on file    Socially, she is widowed for 5 years.  She does not have children.  She lives alone.  She had worked as a Holiday representative and hospice.  She graduated from [employer] and attained a Production designer, theatre/television/film at [employer].  There is no tobacco history.  She drinks rare to an occasional glass of wine.  She works out regularly 4 days/week and has a Physiological scientist doing strength training and aerobic exercise for 30 to 60 minutes.  Family History:  The patient's family history includes Cancer in her mother; Diabetes in her father; Hyperlipidemia in her father; Hypertension in her father.  Her mother died at age 8 and had metastatic breast cancer.  Her father died at age 41 and had diabetes and gangrene.  She has 2 sisters ages 22 and 36.  ROS General: Negative; No fevers, chills, or night sweats;  HEENT: Negative; No changes in vision or hearing, sinus congestion, difficulty swallowing Pulmonary: Negative; No cough, wheezing, shortness of breath, hemoptysis Cardiovascular: Negative; No chest pain, presyncope, syncope, palpitations GI: Negative; No nausea, vomiting, diarrhea, or abdominal pain GU: Negative; No dysuria, hematuria, or difficulty voiding Musculoskeletal: Negative; no myalgias, joint pain, or weakness Hematologic/Oncology: Prior history of iron deficiency Endocrine: Sudden development of hyperthyroidism May 2022 Neuro: Negative; no changes in balance, headaches Skin: Negative; No rashes or skin lesions Psychiatric: Negative; No behavioral problems, depression Sleep: Negative; No snoring, daytime sleepiness, hypersomnolence, bruxism, restless legs, hypnogognic hallucinations, no  cataplexy Other comprehensive 14 point system review is negative.   PHYSICAL EXAM:   VS:  BP 140/60 (BP Location: Left Arm)   Pulse 75   Ht 5' 2.5" (1.588 m)   Wt 110 lb 6.4 oz (50.1 kg)   BMI 19.87 kg/m     Repeat blood pressure by me was 130/70 supine and 122/70 standing  Wt Readings from Last 3 Encounters:  12/14/20 110 lb 6.4 oz (50.1 kg)  10/19/20 113 lb 9.6 oz (51.5 kg)  10/17/20 113 lb 14.4 oz (51.7 kg)    General: Alert, oriented, no distress.  Skin: normal turgor, no rashes, warm and dry HEENT: Normocephalic, atraumatic. Pupils equal round and reactive to light; sclera anicteric; extraocular muscles intact;  Nose without nasal septal hypertrophy Mouth/Parynx benign; Mallinpatti scale 3 Neck: No JVD, no carotid bruits; normal carotid upstroke Lungs: clear to ausculatation and percussion; no wheezing or rales Chest wall: without tenderness to palpitation Heart: PMI not displaced, rhythm was  regular with occasional ectopy., s1 s2 normal, 1/6 systolic murmur, no diastolic murmur, systolic click, no rubs, gallops, thrills, or heaves Abdomen: soft, nontender; no hepatosplenomehaly, BS+; abdominal aorta nontender and not dilated by palpation. Back: no CVA tenderness Pulses 2+ Musculoskeletal: full range of motion, normal strength, no joint deformities Extremities: no clubbing cyanosis or edema, Homan's sign negative  Neurologic: grossly nonfocal; Cranial nerves grossly wnl Psychologic: Normal mood and affect   Studies/Labs Reviewed:   EKG:  EKG is ordered today.  ECG (independently read by me):  Sinus rhythm at 75; short PR interval 92 msec; PACs;QTc 437 msec;   I personally reviewed her ECG from October 12, 2020 which showed sinus arrhythmia with PACs with short PR interval.  There is no evidence for preexcitation.  Recent Labs: BMP Latest Ref Rng & Units 10/12/2020  Glucose 70 - 99 mg/dL 103(H)  BUN 8 - 23 mg/dL 10  Creatinine 0.44 - 1.00 mg/dL 0.44  Sodium 135 - 145  mmol/L 133(L)  Potassium 3.5 - 5.1 mmol/L 3.7  Chloride 98 - 111 mmol/L 104  CO2 22 - 32 mmol/L 22  Calcium 8.9 - 10.3 mg/dL 8.8(L)     Hepatic Function Latest Ref Rng & Units 10/12/2020  Total Protein 6.5 - 8.1 g/dL 6.3(L)  Albumin 3.5 - 5.0 g/dL 3.3(L)  AST 15 - 41 U/L 150(H)  ALT 0 - 44 U/L 66(H)  Alk Phosphatase 38 - 126 U/L 236(H)  Total Bilirubin 0.3 - 1.2 mg/dL 2.5(H)    CBC Latest Ref Rng & Units 10/12/2020  WBC 4.0 - 10.5 K/uL 4.5  Hemoglobin 12.0 - 15.0 g/dL 10.6(L)  Hematocrit 36.0 - 46.0 % 31.8(L)  Platelets 150 - 400 K/uL 286   Lab Results  Component Value Date   MCV 91.4 10/12/2020   Lab Results  Component Value Date   TSH <0.010 (L) 10/12/2020   No results found for: HGBA1C   BNP No results found for: BNP  ProBNP No results found for: PROBNP   Lipid Panel  No results found for: CHOL, TRIG, HDL, CHOLHDL, VLDL, LDLCALC, LDLDIRECT, LABVLDL   RADIOLOGY: No results found.   Additional studies/ records that were reviewed today include:  I reviewed the records of Dr. Felipa Eth at [place].  I reviewed the records of Dr. Loanne Drilling and ER evaluation.   ASSESSMENT:    1. Hyperthyroidism   2. Mitral valve insufficiency, unspecified etiology   3. Shortened PR interval   4. Pulmonary hypertension ([place])   5. Coronary artery calcification     PLAN:  Ms. Sara Carson is a very pleasant 75 year old female developed sudden onset of hyperthyroidism/Graves' disease in May 2021 which he temporally relates to approximately 5 days after getting her second COVID booster shot.  At initial presentation she began to notice significant increase in heart rate as weakness.  She initially was started on methimazole on September 19, 2020.  She subsequently presented to [facility] ER at [facility] on October 12, 2020 with generalized weakness and weight loss of 15 pounds over the prior month.  Diffuse itching of her skin and it was felt that this may have been related to her methimazole.   She subsequently underwent treatment at [facility] and received radioactive iodine therapy on November 14, 2020.  Her free T4 levels are slowly decreasing and her most recent level was 1.7.  Early in June she had an echo Doppler study at [facility] which was interpreted by Shirlee More.  She was found to have normal  systolic and diastolic function with EF at 62%.  There was moderate left atrial dilatation as well as mild RA dilatation.  RV was felt to be borderline dilated.  Her mitral valve is structurally normal although there was mild MR.  She was estimated to have possible moderate pulmonary hypertension.  She tells me since she has been on therapy and treatment with a atenolol her palpitations and increased heart rate has improved.  He had initially experienced tremors which have now essentially resolved.  She has a follow-up appointment at [facility] on December 21, 2020.  On her thyroid scan it was noted that she had mild coronary artery calcification.  I am recommending she undergo a 2D echo Doppler study for follow-up evaluation approximately a month after additional treatment to reassess her estimated pulmonary pressure.  Presently, she has no signs of right-sided volume overload or pressure on examination.  I have recommended she continue her current dose of atenolol.  Her ECG has demonstrated sinus rhythm.  She has a short PR interval without any delta wave arguing against WPW/preexcitation.  I will see her in 2 months for follow-up evaluation or sooner as needed.   Medication Adjustments/Labs and Tests Ordered: Current medicines are reviewed at length with the patient today.  Concerns regarding medicines are outlined above.  Medication changes, Labs and Tests ordered today are listed in the Patient Instructions below. Patient Instructions  Medication Instructions:   NO CHANGES    Lab Work:  NOT NEEDED   Testing/Procedures: [place] [address] Your physician has requested that  you have an echocardiogram. Echocardiography is a painless test that uses sound waves to create images of your heart. It provides your doctor with information about the size and shape of your heart and how well your heart's chambers and valves are working. This procedure takes approximately one hour. There are no restrictions for this procedure.    Follow-Up: At [facility], you and your health needs are our priority.  As part of our continuing mission to provide you with exceptional heart care, we have created designated Provider Care Teams.  These Care Teams include your primary Cardiologist (physician) and Advanced Practice Providers (APPs -  Physician Assistants and Nurse Practitioners) who all work together to provide you with the care you need, when you need it.     Your next appointment:   2 month(s)  The format for your next appointment:   In Person  Provider:   Shelva Majestic, MD     Signed, Shelva Majestic, MD  12/21/2020 6:53 PM    [facility] [address], [place], [place], [place]  [address] Phone: [phone]

## 2020-12-21 ENCOUNTER — Encounter: Payer: Self-pay | Admitting: Cardiovascular Disease

## 2020-12-21 DIAGNOSIS — E0500 Thyrotoxicosis with diffuse goiter without thyrotoxic crisis or storm: Secondary | ICD-10-CM | POA: Diagnosis not present

## 2020-12-21 DIAGNOSIS — R739 Hyperglycemia, unspecified: Secondary | ICD-10-CM | POA: Diagnosis not present

## 2021-01-09 DIAGNOSIS — D508 Other iron deficiency anemias: Secondary | ICD-10-CM | POA: Diagnosis not present

## 2021-01-14 ENCOUNTER — Ambulatory Visit (HOSPITAL_COMMUNITY): Payer: Medicare Other | Attending: Cardiology

## 2021-01-14 ENCOUNTER — Other Ambulatory Visit: Payer: Self-pay

## 2021-01-14 DIAGNOSIS — I272 Pulmonary hypertension, unspecified: Secondary | ICD-10-CM | POA: Diagnosis not present

## 2021-01-14 DIAGNOSIS — E0590 Thyrotoxicosis, unspecified without thyrotoxic crisis or storm: Secondary | ICD-10-CM | POA: Diagnosis not present

## 2021-01-14 DIAGNOSIS — I340 Nonrheumatic mitral (valve) insufficiency: Secondary | ICD-10-CM

## 2021-01-14 LAB — ECHOCARDIOGRAM COMPLETE
Area-P 1/2: 3.42 cm2
S' Lateral: 3 cm

## 2021-01-18 DIAGNOSIS — Z23 Encounter for immunization: Secondary | ICD-10-CM | POA: Diagnosis not present

## 2021-01-25 DIAGNOSIS — R7309 Other abnormal glucose: Secondary | ICD-10-CM | POA: Diagnosis not present

## 2021-01-25 DIAGNOSIS — E0590 Thyrotoxicosis, unspecified without thyrotoxic crisis or storm: Secondary | ICD-10-CM | POA: Diagnosis not present

## 2021-01-28 ENCOUNTER — Ambulatory Visit (INDEPENDENT_AMBULATORY_CARE_PROVIDER_SITE_OTHER): Payer: Medicare Other | Admitting: Cardiovascular Disease

## 2021-01-28 ENCOUNTER — Encounter: Payer: Self-pay | Admitting: Cardiovascular Disease

## 2021-01-28 ENCOUNTER — Other Ambulatory Visit: Payer: Self-pay

## 2021-01-28 DIAGNOSIS — I2584 Coronary atherosclerosis due to calcified coronary lesion: Secondary | ICD-10-CM

## 2021-01-28 DIAGNOSIS — I2510 Atherosclerotic heart disease of native coronary artery without angina pectoris: Secondary | ICD-10-CM | POA: Diagnosis not present

## 2021-01-28 DIAGNOSIS — I358 Other nonrheumatic aortic valve disorders: Secondary | ICD-10-CM | POA: Diagnosis not present

## 2021-01-28 DIAGNOSIS — E0500 Thyrotoxicosis with diffuse goiter without thyrotoxic crisis or storm: Secondary | ICD-10-CM | POA: Diagnosis not present

## 2021-01-28 DIAGNOSIS — R9431 Abnormal electrocardiogram [ECG] [EKG]: Secondary | ICD-10-CM

## 2021-01-28 DIAGNOSIS — I272 Pulmonary hypertension, unspecified: Secondary | ICD-10-CM

## 2021-01-28 MED ORDER — ATENOLOL 25 MG PO TABS: ORAL_TABLET | ORAL | 3 refills | Status: DC

## 2021-01-28 NOTE — Progress Notes (Signed)
Cardiology Office Note    Date:  01/28/2021   ID:  Sara Carson, DOB 1945/07/29, MRN [id number]  PCP:  Lajean Manes, MD  Cardiologist:  Shelva Majestic, MD   F/U cardiology evaluation initially referred by Dr. Felipa Eth for possible pulmonary hypertension.   History of Present Illness:  Sara Carson is a 75 y.o. female who is followed by Dr. Felipa Eth.  States that she was in her usual health until approximately 5 days after she took her second COVID booster this year.  Approximately 5 days later she started to notice increased heart rate currently was diagnosed with hyperthyroidism in late May 2022.  On presentation in addition to her tachycardia, she had weakness, and weight loss. She apparently was seen by Dr. Renato Shin and did not tolerate Tapazole secondary to elevated transaminases nausea and itching.  She ultimately went to [facility] and received radioactive iodine treatment on November 14, 2020.  Following therapy her free T4 has decreased from 4.12 most recently at 1.7.  With her palpitations which initiated in May she was started on atenolol with improvement in her heart rate and she presently continues to take 25 mg twice a day.  June 22, she had an echo Doppler study done at [place] and the report normal LV cavity size with normal systolic and diastolic function.  Her left atrium was moderately dilated.  Her right ventricle was borderline dilated and there was mild dilatation of her right atrium.  Her mitral valve was structurally normal and there was mild mitral regurgitation.  She was estimated to have possible moderate pulmonary hypertension.  Because of pulmonary hypertension on echocardiographic assessment, she was referred by Dr. Felipa Eth for cardiology evaluation.  When I saw her for initial evaluation with me on December 14, 2020 she felt improved with reference to her previous sudden development of hyperthyroidism and Graves' disease.  Previous weakness and nausea  had resolved.  She was unaware of any significant palpitations, presyncope or syncope.  She denied any chest tightness, PND, or orthopnea.   Since I initially saw her, she has been undergoing follow-up aspirin at [facility].  She has continued to be on atenolol 25 mg twice a day.  There has been resolution of her increased heart rate and palpitations.  Her tremors have resolved.  She recently underwent follow-up laboratory on January 25, 2021 which now shows TSH at 26.9.  She has not yet been notified about initiation of any levothyroxine and has a scheduled appointment to [facility]in[facility] [place] in early December, 2022.  I initially saw her, I recommended she undergo a follow-up echo Doppler study to reassess her previous pulmonary hypertension.  The echo evaluation was done on January 14, 2021 shows normal LV function with EF 55 to 60% with normal diastolic parameters.  There was mild aortic sclerosis without stenosis.  RV and PA pressures were normal at 28.6 mm.  Presently, she feels well.  She has gained approximately 5 of weight.  She denies chest pain, palpitations, or lethargy.   Past Medical History:  Diagnosis Date   Anemia    Depression    Graves disease 10/04/2020   Shingles     Past Surgical History:  Procedure Laterality Date   ABDOMINAL HYSTERECTOMY     TONSILLECTOMY      Current Medications: Outpatient Medications Prior to Visit  Medication Sig Dispense Refill   calcium citrate (CALCITRATE - DOSED IN MG ELEMENTAL CALCIUM) 950 MG  tablet Take 200 mg of elemental calcium by mouth daily.     cholecalciferol (VITAMIN D3) 25 MCG (1000 UNIT) tablet Take 1,000 Units by mouth daily.     citalopram (CELEXA) 10 MG tablet Take 10 mg by mouth daily.     ferrous gluconate (FERGON) 240 (27 FE) MG tablet Take 27 mg by mouth daily.     Multiple Vitamin (MULTIVITAMIN) tablet Take 1 tablet by mouth daily.     selenium 200 MCG TABS tablet Take 200 mcg by  mouth daily.     vitamin C (ASCORBIC ACID) 500 MG tablet Take 500 mg by mouth daily.     atenolol (TENORMIN) 25 MG tablet      atenolol (TENORMIN) 25 MG tablet Take 1 tablet (25 mg total) by mouth 2 (two) times daily. 180 tablet 3   No facility-administered medications prior to visit.     Allergies:   Methimazole, Amoxicillin, and Levaquin [levofloxacin in d5w]   Social History   Socioeconomic History   Marital status: Widowed    Spouse name: Not on file   Number of children: Not on file   Years of education: Not on file   Highest education level: Not on file  Occupational History   Not on file  Tobacco Use   Smoking status: Never   Smokeless tobacco: Never  Vaping Use   Vaping Use: Never used  Substance and Sexual Activity   Alcohol use: Not Currently    Alcohol/week: 3.0 standard drinks    Types: 3 Glasses of wine per week   Drug use: Never   Sexual activity: Not on file  Other Topics Concern   Not on file  Social History Narrative   Not on file   Social Determinants of Health   Financial Resource Strain: Not on file  Food Insecurity: Not on file  Transportation Needs: Not on file  Physical Activity: Not on file  Stress: Not on file  Social Connections: Not on file    Socially, she is widowed for 5 years.  She does not have children.  She lives alone.  She had worked as a Holiday representative and hospice.  She graduated from [employer] and attained a Production designer, theatre/television/film at [employer].  There is no tobacco history.  She drinks rare to an occasional glass of wine.  She works out regularly 4 days/week and has a Physiological scientist doing strength training and aerobic exercise for 30 to 60 minutes.  Family History:  The patient's family history includes Cancer in her mother; Diabetes in her father; Hyperlipidemia in her father; Hypertension in her father.  Her mother died at age 16 and had metastatic breast cancer.  Her father died at age 36 and had diabetes and gangrene.  She  has 2 sisters ages 60 and 72.  ROS General: Negative; No fevers, chills, or night sweats;  HEENT: Negative; No changes in vision or hearing, sinus congestion, difficulty swallowing Pulmonary: Negative; No cough, wheezing, shortness of breath, hemoptysis Cardiovascular: see HPI GI: Negative; No nausea, vomiting, diarrhea, or abdominal pain GU: Negative; No dysuria, hematuria, or difficulty voiding Musculoskeletal: Negative; no myalgias, joint pain, or weakness Hematologic/Oncology: Prior history of iron deficiency Endocrine: Sudden development of hyperthyroidism May 2022 Neuro: Negative; no changes in balance, headaches Skin: Negative; No rashes or skin lesions Psychiatric: Negative; No behavioral problems, depression Sleep: Negative; No snoring, daytime sleepiness, hypersomnolence, bruxism, restless legs, hypnogognic hallucinations, no cataplexy Other comprehensive 14 point system review is negative.  PHYSICAL EXAM:   VS:  BP 130/70   Pulse (!) 57   Ht _0  (1.575 m)   Wt 114 lb 9.6 oz (52 kg)   SpO2 97%   BMI 20.96 kg/m     Repeat blood pressure by me was 138/72 supine and 130/70 standing  Wt Readings from Last 3 Encounters:  01/28/21 114 lb 9.6 oz (52 kg)  12/14/20 110 lb 6.4 oz (50.1 kg)  10/19/20 113 lb 9.6 oz (51.5 kg)    General: Alert, oriented, no distress.  Skin: normal turgor, no rashes, warm and dry HEENT: Normocephalic, atraumatic. Pupils equal round and reactive to light; sclera anicteric; extraocular muscles intact; Nose without nasal septal hypertrophy Mouth/Parynx benign; Mallinpatti scale 3 Neck: No JVD, no carotid bruits; normal carotid upstroke Lungs: clear to ausculatation and percussion; no wheezing or rales Chest wall: without tenderness to palpitation Heart: PMI not displaced, RRR, s1 s2 normal, 1/6 systolic murmur, no diastolic murmur, no rubs, gallops, thrills, or heaves Abdomen: soft, nontender; no hepatosplenomehaly, BS+; abdominal aorta  nontender and not dilated by palpation. Back: no CVA tenderness Pulses 2+ Musculoskeletal: full range of motion, normal strength, no joint deformities Extremities: no clubbing cyanosis or edema, Homan's sign negative  Neurologic: grossly nonfocal; Cranial nerves grossly wnl Psychologic: Normal mood and affect    Studies/Labs Reviewed:   January 28, 2021 ECG (independently read by me): Normal sinus rhythm at 60 bpm with isolated PAC and mild sinus arrhythmia.  QTc interval 434 ms.  PR interval today is 122 ms.  There is no evidence for preexcitation.   A second ECG was also done which showed sinus bradycardia at 57 bpm with mild sinus arrhythmia without any PAC;  PR interval at 118 ms.  December 14, 2020 ECG (independently read by me):  Sinus rhythm at 75; short PR interval 92 msec; PACs;QTc 437 msec;   I personally reviewed her ECG from October 12, 2020 which showed sinus arrhythmia with PACs with short PR interval.  There is no evidence for preexcitation.  Recent Labs: BMP Latest Ref Rng & Units 10/12/2020  Glucose 70 - 99 mg/dL 103(H)  BUN 8 - 23 mg/dL 10  Creatinine 0.44 - 1.00 mg/dL 0.44  Sodium 135 - 145 mmol/L 133(L)  Potassium 3.5 - 5.1 mmol/L 3.7  Chloride 98 - 111 mmol/L 104  CO2 22 - 32 mmol/L 22  Calcium 8.9 - 10.3 mg/dL 8.8(L)     Hepatic Function Latest Ref Rng & Units 10/12/2020  Total Protein 6.5 - 8.1 g/dL 6.3(L)  Albumin 3.5 - 5.0 g/dL 3.3(L)  AST 15 - 41 U/L 150(H)  ALT 0 - 44 U/L 66(H)  Alk Phosphatase 38 - 126 U/L 236(H)  Total Bilirubin 0.3 - 1.2 mg/dL 2.5(H)    CBC Latest Ref Rng & Units 10/12/2020  WBC 4.0 - 10.5 K/uL 4.5  Hemoglobin 12.0 - 15.0 g/dL 10.6(L)  Hematocrit 36.0 - 46.0 % 31.8(L)  Platelets 150 - 400 K/uL 286   Lab Results  Component Value Date   MCV 91.4 10/12/2020   Lab Results  Component Value Date   TSH <0.010 (L) 10/12/2020   No results found for: HGBA1C   BNP No results found for: BNP  ProBNP No results found for:  PROBNP   Lipid Panel  No results found for: CHOL, TRIG, HDL, CHOLHDL, VLDL, LDLCALC, LDLDIRECT, LABVLDL   RADIOLOGY: ECHOCARDIOGRAM COMPLETE  Result Date: 01/14/2021    ECHOCARDIOGRAM REPORT   Patient Name:   Ralphine A Martino  Date of Exam: 01/14/2021 Medical Rec #:  [id number]       Height:       62.5 in Accession #:    [id number]      Weight:       110.4 lb Date of Birth:  01-Dec-1945        BSA:          1.494 m Patient Age:    39 years        BP:           140/60 mmHg Patient Gender: F               HR:           62 bpm. Exam Location:  [facility] Procedure: 2D Echo, 3D Echo, Cardiac Doppler, Color Doppler and Strain Analysis Indications:   R01.1 Murmur  History:       Patient has no prior history of Echocardiogram examinations.                Anemia.  Sonographer:   Basilia Jumbo BS, RDCS Referring      [place]: IMPRESSIONS  1. Left ventricular ejection fraction, by estimation, is 55 to 60%. Left ventricular ejection fraction by 3D volume is 56 %. The left ventricle has normal function. The left ventricle has no regional wall motion abnormalities. Left ventricular diastolic  parameters were normal. The average left ventricular global longitudinal strain is -21.4 %. The global longitudinal strain is normal.  2. Right ventricular systolic function is normal. The right ventricular size is normal. There is normal pulmonary artery systolic pressure. The estimated right ventricular systolic pressure is 84.1 mmHg.  3. The mitral valve is normal in structure. Trivial mitral valve regurgitation. No evidence of mitral stenosis.  4. The aortic valve is tricuspid. Aortic valve regurgitation is not visualized. Mild aortic valve sclerosis is present, with no evidence of aortic valve stenosis.  5. The inferior vena cava is normal in size with greater than 50% respiratory variability, suggesting right atrial pressure of 3 mmHg. FINDINGS  Left Ventricle: Left ventricular ejection fraction, by estimation,  is 55 to 60%. Left ventricular ejection fraction by 3D volume is 56 %. The left ventricle has normal function. The left ventricle has no regional wall motion abnormalities. The average left ventricular global longitudinal strain is -21.4 %. The global longitudinal strain is normal. The left ventricular internal cavity size was normal in size. There is no left ventricular hypertrophy. Left ventricular diastolic parameters were normal. Normal left ventricular filling pressure. Right Ventricle: The right ventricular size is normal. No increase in right ventricular wall thickness. Right ventricular systolic function is normal. There is normal pulmonary artery systolic pressure. The tricuspid regurgitant velocity is 2.53 m/s, and  with an assumed right atrial pressure of 3 mmHg, the estimated right ventricular systolic pressure is 66.0 mmHg. Left Atrium: Left atrial size was normal in size. Right Atrium: Right atrial size was normal in size. Pericardium: There is no evidence of pericardial effusion. Mitral Valve: The mitral valve is normal in structure. Trivial mitral valve regurgitation. No evidence of mitral valve stenosis. Tricuspid Valve: The tricuspid valve is normal in structure. Tricuspid valve regurgitation is trivial. No evidence of tricuspid stenosis. Aortic Valve: The aortic valve is tricuspid. Aortic valve regurgitation is not visualized. Mild aortic valve sclerosis is present, with no evidence of aortic valve stenosis. Pulmonic Valve: The pulmonic valve was normal in structure. Pulmonic valve regurgitation is trivial. No evidence  of pulmonic stenosis. Aorta: The aortic root is normal in size and structure. Venous: The inferior vena cava is normal in size with greater than 50% respiratory variability, suggesting right atrial pressure of 3 mmHg. IAS/Shunts: No atrial level shunt detected by color flow Doppler.  LEFT VENTRICLE PLAX 2D LVIDd:         4.20 cm         Diastology LVIDs:         3.00 cm         LV  e' medial:    8.49 cm/s LV PW:         0.90 cm         LV E/e' medial:  6.7 LV IVS:        0.90 cm         LV e' lateral:   10.60 cm/s LVOT diam:     2.00 cm         LV E/e' lateral: 5.4 LV SV:         70 LV SV Index:   47              2D LVOT Area:     3.14 cm        Longitudinal                                Strain                                2D Strain GLS  -20.1 %                                (A2C):                                2D Strain GLS  -19.6 %                                (A3C):                                2D Strain GLS  -24.5 %                                (A4C):                                2D Strain GLS  -21.4 %                                Avg:                                 3D Volume EF                                LV 3D EF:    Left  ventricul                                             ar                                             ejection                                             fraction                                             by 3D                                             volume is                                             56 %.                                 3D Volume EF:                                3D EF:        56 %                                LV EDV:       103 ml                                LV ESV:       45 ml                                LV SV:        57 ml RIGHT VENTRICLE             IVC RV Basal diam:  3.20 cm     IVC diam: 1.50 cm RV S prime:     14.70 cm/s TAPSE (M-mode): 2.0 cm RVSP:           28.6 mmHg LEFT ATRIUM             Index       RIGHT ATRIUM           Index LA diam:        3.90 cm 2.61 cm/m  RA Pressure: 3.00 mmHg LA Vol (A2C):   47.4 ml 31.73 ml/m RA Area:     12.00 cm LA  Vol (A4C):   52.2 ml 34.94 ml/m RA Volume:   25.40 ml  17.00 ml/m LA Biplane Vol: 51.2 ml 34.27 ml/m  AORTIC VALVE LVOT Vmax:   93.00 cm/s LVOT Vmean:  64.700 cm/s LVOT VTI:    0.222 m  AORTA Ao Root diam: 2.90 cm  Ao Asc diam:  3.20 cm MITRAL VALVE               TRICUSPID VALVE                            TR Peak grad:   25.6 mmHg MV Decel Time: 222 msec    TR Vmax:        253.00 cm/s MV E velocity: 57.30 cm/s  Estimated RAP:  3.00 mmHg MV A velocity: 60.70 cm/s  RVSP:           28.6 mmHg MV E/A ratio:  0.94                            SHUNTS                            Systemic VTI:  0.22 m                            Systemic Diam: 2.00 cm Fransico Him MD Electronically signed by Fransico Him MD Signature Date/Time: 01/14/2021/10:32:16 AM    Final      Additional studies/ records that were reviewed today include:  I reviewed the records of Dr. Felipa Eth at [place].  I reviewed the records of Dr. Loanne Drilling and ER evaluation.  ECHO: 12/14/2020 IMPRESSIONS   1. Left ventricular ejection fraction, by estimation, is 55 to 60%. Left  ventricular ejection fraction by 3D volume is 56 %. The left ventricle has  normal function. The left ventricle has no regional wall motion  abnormalities. Left ventricular diastolic   parameters were normal. The average left ventricular global longitudinal  strain is -21.4 %. The global longitudinal strain is normal.   2. Right ventricular systolic function is normal. The right ventricular  size is normal. There is normal pulmonary artery systolic pressure. The  estimated right ventricular systolic pressure is 63.0 mmHg.   3. The mitral valve is normal in structure. Trivial mitral valve  regurgitation. No evidence of mitral stenosis.   4. The aortic valve is tricuspid. Aortic valve regurgitation is not  visualized. Mild aortic valve sclerosis is present, with no evidence of  aortic valve stenosis.   5. The inferior vena cava is normal in size with greater than 50%  respiratory variability, suggesting right atrial pressure of 3 mmHg.  ASSESSMENT:    1. Graves disease: s/p radioactive iodine November 14, 2020   2. Coronary artery calcification   3. Aortic valve sclerosis   4. Shortened  PR interval   5. Pulmonary hypertension ([place]): resolved on echo 01/14/2021     PLAN:  Ms. Emaree Chiu is a very pleasant 75 year old female who developed sudden onset of hyperthyroidism/Graves' disease in May 2022  which she temporally relates to approximately 5 days after getting her second COVID booster shot.  At initial presentation she began to notice significant increase in heart rate as weakness.  She initially was started on methimazole on September 19, 2020.  She subsequently presented to [facility]  ER at [facility] on October 12, 2020 with generalized weakness and weight loss of 15 pounds over the prior month.   She developed diffuse itching of her skin and it was felt that this may have been related to her methimazole.  She subsequently underwent treatment at [facility] and received radioactive iodine therapy on November 14, 2020.  Her free T4 levels are slowly decreasing and her most recent level was 1.7.  On her thyroid scan she was noted to have mild coronary calcification early in June she had an echo Doppler study at [facility] which was interpreted by Dr. Pamelia Hoit.  She was found to have normal systolic and diastolic function with EF at 62%.  There was moderate left atrial dilatation as well as mild RA dilatation.  RV was felt to be borderline dilated.  Her mitral valve is structurally normal although there was mild MR.  She was estimated to have possible moderate pulmonary hypertension.  She was started on atenolol 25 mg twice a day with significant improvement in her palpitations and reduction in her heart rate.  When I saw her, I had recommended she undergo follow-up echo Doppler study for reassessment after treatment of her Graves' disease for further evaluation of her previously detected pulmonary hypertension.  I reviewed her echo Doppler study in detail today.  The study was done on January 14, 2021 and showed normal systolic and diastolic function with EF at 655 to 60%.  On this present study, there  was no evidence for any atrial enlargement.  Pulmonary artery systolic and right ventricular systolic pressure was normal at 28.6.  There was evidence for mild aortic sclerosis without stenosis.  There was trivial mitral regurgitation.  Clinically, she feels well.  Her resting pulse is now in the upper 50s to 60s.  Her ECG today showed mild sinus arrhythmia with an isolated PAC.  Her blood pressure continues to be stable with systolic blood pressure in the 185U and diastolic around 70.  She was wondering about possibly being able to reduce we eventually stop her atenolol.  At this time, I have recommended she reduce her atenolol from 25 mg twice a day and take 25 mg in the morning but reduce her evening dose to 12.5 mg.  She will monitor her blood pressure and heart rate.  She is starting to develop hypothyroidism on her most recent TSH and I anticipate she most likely will be started by chronology physicians on the initially low-dose levothyroxine.  If her resting heart rate decreases to the low 50s I have recommended she discontinue the evening dose and continue the 25 mg in the morning.  We will be contacting her [facility] physicians concerning her recent TSH at 26.9 and will be is establishing follow-up care at their [place] office.  She continues to exercise daily and feels well.  She sees Dr. Felipa Eth for primary care.  I discussed with her potential increase in cholesterol levels if she develops hypothyroidism.  I would suggest she undergo a follow-up fasting lipid panel at her next evaluation.  I will see her in 3 to 4 months for follow-up Cardiologic evaluation.   Medication Adjustments/Labs and Tests Ordered: Current medicines are reviewed at length with the patient today.  Concerns regarding medicines are outlined above.  Medication changes, Labs and Tests ordered today are listed in the Patient Instructions below. Patient Instructions  Medication Instructions:  DECREASE atenolol to 25 mg in  the AM and 12.5 mg in the PM  *  If you need a refill on your cardiac medications before your next appointment, please call your pharmacy*  Follow-Up: At [facility], you and your health needs are our priority.  As part of our continuing mission to provide you with exceptional heart care, we have created designated Provider Care Teams.  These Care Teams include your primary Cardiologist (physician) and Advanced Practice Providers (APPs -  Physician Assistants and Nurse Practitioners) who all work together to provide you with the care you need, when you need it.  We recommend signing up for the patient portal called "MyChart".  Sign up information is provided on this After Visit Summary.  MyChart is used to connect with patients for Virtual Visits (Telemedicine).  Patients are able to view lab/test results, encounter notes, upcoming appointments, etc.  Non-urgent messages can be sent to your provider as well.   To learn more about what you can do with MyChart, go to [email or web address].    Your next appointment:   3-4 month(s)  The format for your next appointment:   In Person  Provider:   Shelva Majestic, MD     Signed, Shelva Majestic, MD  01/28/2021 11:30 AM    [place] [address], [place], [place], [place]  [address] Phone: [phone]

## 2021-01-28 NOTE — Patient Instructions (Signed)
Medication Instructions:  DECREASE atenolol to 25 mg in the AM and 12.5 mg in the PM  *If you need a refill on your cardiac medications before your next appointment, please call your pharmacy*  Follow-Up: At [facility], you and your health needs are our priority.  As part of our continuing mission to provide you with exceptional heart care, we have created designated Provider Care Teams.  These Care Teams include your primary Cardiologist (physician) and Advanced Practice Providers (APPs -  Physician Assistants and Nurse Practitioners) who all work together to provide you with the care you need, when you need it.  We recommend signing up for the patient portal called "MyChart".  Sign up information is provided on this After Visit Summary.  MyChart is used to connect with patients for Virtual Visits (Telemedicine).  Patients are able to view lab/test results, encounter notes, upcoming appointments, etc.  Non-urgent messages can be sent to your provider as well.   To learn more about what you can do with MyChart, go to [email or web address].    Your next appointment:   3-4 month(s)  The format for your next appointment:   In Person  Provider:   Shelva Majestic, MD

## 2021-01-29 DIAGNOSIS — Z23 Encounter for immunization: Secondary | ICD-10-CM | POA: Diagnosis not present

## 2021-03-04 DIAGNOSIS — Z20822 Contact with and (suspected) exposure to covid-19: Secondary | ICD-10-CM | POA: Diagnosis not present

## 2021-03-05 DIAGNOSIS — E890 Postprocedural hypothyroidism: Secondary | ICD-10-CM | POA: Diagnosis not present

## 2021-03-21 DIAGNOSIS — Z1231 Encounter for screening mammogram for malignant neoplasm of breast: Secondary | ICD-10-CM | POA: Diagnosis not present

## 2021-03-22 DIAGNOSIS — Z79899 Other long term (current) drug therapy: Secondary | ICD-10-CM | POA: Diagnosis not present

## 2021-03-22 DIAGNOSIS — E890 Postprocedural hypothyroidism: Secondary | ICD-10-CM | POA: Diagnosis not present

## 2021-03-27 DIAGNOSIS — L308 Other specified dermatitis: Secondary | ICD-10-CM | POA: Diagnosis not present

## 2021-03-27 DIAGNOSIS — D225 Melanocytic nevi of trunk: Secondary | ICD-10-CM | POA: Diagnosis not present

## 2021-03-27 DIAGNOSIS — L738 Other specified follicular disorders: Secondary | ICD-10-CM | POA: Diagnosis not present

## 2021-03-27 DIAGNOSIS — L812 Freckles: Secondary | ICD-10-CM | POA: Diagnosis not present

## 2021-04-16 DIAGNOSIS — E0590 Thyrotoxicosis, unspecified without thyrotoxic crisis or storm: Secondary | ICD-10-CM | POA: Diagnosis not present

## 2021-05-16 ENCOUNTER — Telehealth: Payer: Self-pay | Admitting: Cardiovascular Disease

## 2021-05-16 NOTE — Telephone Encounter (Signed)
Received a call from patient.She stated she has been sob and chest tightness off and on for the past couple of weeks.No sob and no chest tightness at present.Appointment scheduled with Dr.Kelly 2/8 at 4:00 pm.Advised to go to ED if sob and chest tightness worsens.

## 2021-05-16 NOTE — Telephone Encounter (Signed)
Pt c/o Shortness Of Breath: STAT if SOB developed within the last 24 hours or pt is noticeably SOB on the phone  1. Are you currently SOB (can you hear that pt is SOB on the phone)? Yes   2. How long have you been experiencing SOB? Last week  3. Are you SOB when sitting or when up moving around? Both   4. Are you currently experiencing any other symptoms? Fatigue

## 2021-05-17 DIAGNOSIS — D508 Other iron deficiency anemias: Secondary | ICD-10-CM | POA: Diagnosis not present

## 2021-05-17 DIAGNOSIS — I272 Pulmonary hypertension, unspecified: Secondary | ICD-10-CM | POA: Diagnosis not present

## 2021-05-17 DIAGNOSIS — K219 Gastro-esophageal reflux disease without esophagitis: Secondary | ICD-10-CM | POA: Diagnosis not present

## 2021-05-17 DIAGNOSIS — D509 Iron deficiency anemia, unspecified: Secondary | ICD-10-CM | POA: Diagnosis not present

## 2021-05-17 DIAGNOSIS — F3340 Major depressive disorder, recurrent, in remission, unspecified: Secondary | ICD-10-CM | POA: Diagnosis not present

## 2021-05-20 DIAGNOSIS — E890 Postprocedural hypothyroidism: Secondary | ICD-10-CM | POA: Diagnosis not present

## 2021-05-22 ENCOUNTER — Ambulatory Visit: Payer: PRIVATE HEALTH INSURANCE | Admitting: Cardiovascular Disease

## 2021-05-29 ENCOUNTER — Ambulatory Visit (INDEPENDENT_AMBULATORY_CARE_PROVIDER_SITE_OTHER): Payer: Medicare Other | Admitting: Cardiovascular Disease

## 2021-05-29 ENCOUNTER — Encounter: Payer: Self-pay | Admitting: Cardiovascular Disease

## 2021-05-29 ENCOUNTER — Other Ambulatory Visit: Payer: Self-pay

## 2021-05-29 DIAGNOSIS — R002 Palpitations: Secondary | ICD-10-CM

## 2021-05-29 DIAGNOSIS — E0500 Thyrotoxicosis with diffuse goiter without thyrotoxic crisis or storm: Secondary | ICD-10-CM | POA: Diagnosis not present

## 2021-05-29 DIAGNOSIS — I2584 Coronary atherosclerosis due to calcified coronary lesion: Secondary | ICD-10-CM

## 2021-05-29 DIAGNOSIS — I2510 Atherosclerotic heart disease of native coronary artery without angina pectoris: Secondary | ICD-10-CM | POA: Diagnosis not present

## 2021-05-29 DIAGNOSIS — E890 Postprocedural hypothyroidism: Secondary | ICD-10-CM

## 2021-05-29 DIAGNOSIS — E0590 Thyrotoxicosis, unspecified without thyrotoxic crisis or storm: Secondary | ICD-10-CM

## 2021-05-29 MED ORDER — ATENOLOL 25 MG PO TABS: ORAL_TABLET | ORAL | 3 refills | Status: DC

## 2021-05-29 NOTE — Progress Notes (Signed)
Cardiology Office Note    Date:  05/29/2021   ID:  Sara Carson, DOB 1945/08/16, MRN [id number]  PCP:  Lajean Manes, MD  Cardiologist:  Shelva Majestic, MD   4 month F/U cardiology evaluation initially referred by Dr. Felipa Eth for possible pulmonary hypertension.   History of Present Illness:  Sara Carson is a 76 y.o. female who is followed by Dr. Felipa Eth.  States that she was in her usual health until approximately 5 days after she took her second COVID booster this year.  Approximately 5 days later she started to notice increased heart rate currently was diagnosed with hyperthyroidism in late May 2022.  On presentation in addition to her tachycardia, she had weakness, and weight loss. She apparently was seen by Dr. Renato Shin and did not tolerate Tapazole secondary to elevated transaminases nausea and itching.  She ultimately went to [facility] and received radioactive iodine treatment on November 14, 2020.  Following therapy her free T4 has decreased from 4.12 most recently at 1.7.  With her palpitations which initiated in May she was started on atenolol with improvement in her heart rate and she presently continues to take 25 mg twice a day.  June 22, she had an echo Doppler study done at [place] and the report normal LV cavity size with normal systolic and diastolic function.  Her left atrium was moderately dilated.  Her right ventricle was borderline dilated and there was mild dilatation of her right atrium.  Her mitral valve was structurally normal and there was mild mitral regurgitation.  She was estimated to have possible moderate pulmonary hypertension.  Because of pulmonary hypertension on echocardiographic assessment, she was referred by Dr. Felipa Eth for cardiology evaluation.  When I saw her for initial evaluation with me on December 14, 2020 she felt improved with reference to her previous sudden development of hyperthyroidism and Graves' disease.  Previous weakness and  nausea had resolved.  She was unaware of any significant palpitations, presyncope or syncope.  She denied any chest tightness, PND, or orthopnea.   Since I initially saw her, she has been undergoing follow-up at [facility].  She has continued to be on atenolol 25 mg twice a day.  There has been resolution of her increased heart rate and palpitations.  Her tremors have resolved.  She recently underwent follow-up laboratory on January 25, 2021 which now shows TSH at 26.9.  She has not yet been notified about initiation of any levothyroxine and has a scheduled appointment [facility] [place] in early December, 2022.  I recommended she undergo a follow-up echo Doppler study to reassess her previous pulmonary hypertension.  The echo evaluation was done on January 14, 2021 shows normal LV function with EF 55 to 60% with normal diastolic parameters.  There was mild aortic sclerosis without stenosis.  RV and PA pressures were normal at 28.6 mm.  I last saw her on January 28, 2021 at which time she felt well and denied any chest pain, palpitations or lethargy.  She had gained approximately 5 pounds.  Her ECG showed mild sinus arrhythmia with isolated PAC.  Blood pressure was controlled.  He was wondering about possibly being able to reduce mentally stop her atenolol.  At that time, I recommended slight reduction of atenolol from 25 mg twice a day to 25 mg in the morning and 12.5 mg daily.  If her heart rate was to increase he  was advised to increase her atenolol back to her prior dose.  He was started to develop hypothyroidism on her most recent TSH.  Subsequently, she has been followed at [facility] and on March 22, 2021 so Sara Journey, FNP for a post ablative hypothyroidism.  She was told to initiate levothyroxine 88 mcg daily.  Her TSH level had increased to 26.9 but most recently had again been over suppressed at 0.15 associated with  increased palpitations.  He admits to some fatigability.  She denies chest pain.  She presents for evaluation.  Past Medical History:  Diagnosis Date   Anemia    Depression    Graves disease 10/04/2020   Shingles     Past Surgical History:  Procedure Laterality Date   ABDOMINAL HYSTERECTOMY     TONSILLECTOMY      Current Medications: Outpatient Medications Prior to Visit  Medication Sig Dispense Refill   atenolol (TENORMIN) 25 MG tablet Take 1 tablet (25 mg total) by mouth in the morning AND 0.5 tablets (12.5 mg total) every evening. 135 tablet 3   calcium citrate (CALCITRATE - DOSED IN MG ELEMENTAL CALCIUM) 950 MG tablet Take 200 mg of elemental calcium by mouth daily.     cholecalciferol (VITAMIN D3) 25 MCG (1000 UNIT) tablet Take 1,000 Units by mouth daily.     citalopram (CELEXA) 10 MG tablet Take 10 mg by mouth daily.     ferrous gluconate (FERGON) 240 (27 FE) MG tablet Take 27 mg by mouth daily.     levothyroxine (SYNTHROID) 75 MCG tablet Take 75 mcg by mouth daily.     Multiple Vitamin (MULTIVITAMIN) tablet Take 1 tablet by mouth daily.     selenium 200 MCG TABS tablet Take 200 mcg by mouth daily.     vitamin C (ASCORBIC ACID) 500 MG tablet Take 500 mg by mouth daily.     No facility-administered medications prior to visit.     Allergies:   Methimazole, Amoxicillin, and Levaquin [levofloxacin in d5w]   Social History   Socioeconomic History   Marital status: Widowed    Spouse name: Not on file   Number of children: Not on file   Years of education: Not on file   Highest education level: Not on file  Occupational History   Not on file  Tobacco Use   Smoking status: Never   Smokeless tobacco: Never  Vaping Use   Vaping Use: Never used  Substance and Sexual Activity   Alcohol use: Not Currently    Alcohol/week: 3.0 standard drinks    Types: 3 Glasses of wine per week   Drug use: Never   Sexual activity: Not on file  Other Topics Concern   Not on file   Social History Narrative   Not on file   Social Determinants of Health   Financial Resource Strain: Not on file  Food Insecurity: Not on file  Transportation Needs: Not on file  Physical Activity: Not on file  Stress: Not on file  Social Connections: Not on file    Socially, she is widowed for 5 years.  She does not have children.  She lives alone.  She had worked as a Holiday representative and hospice.  She graduated from [employer] and attained a Production designer, theatre/television/film at [employer].  There is no tobacco history.  She drinks rare to an occasional glass of wine.  She works out regularly 4 days/week and has a Careers adviser and  aerobic exercise for 30 to 60 minutes.  Family History:  The patient's family history includes Cancer in her mother; Diabetes in her father; Hyperlipidemia in her father; Hypertension in her father.  Her mother died at age 10 and had metastatic breast cancer.  Her father died at age 102 and had diabetes and gangrene.  She has 2 sisters ages 70 and 34.  ROS General: Negative; No fevers, chills, or night sweats;  HEENT: Negative; No changes in vision or hearing, sinus congestion, difficulty swallowing Pulmonary: Negative; No cough, wheezing, shortness of breath, hemoptysis Cardiovascular: see HPI GI: Negative; No nausea, vomiting, diarrhea, or abdominal pain GU: Negative; No dysuria, hematuria, or difficulty voiding Musculoskeletal: Negative; no myalgias, joint pain, or weakness Hematologic/Oncology: Prior history of iron deficiency Endocrine: Sudden development of hyperthyroidism May 2022 Neuro: Negative; no changes in balance, headaches Skin: Negative; No rashes or skin lesions Psychiatric: Negative; No behavioral problems, depression Sleep: Negative; No snoring, daytime sleepiness, hypersomnolence, bruxism, restless legs, hypnogognic hallucinations, no cataplexy Other comprehensive 14 point system review is negative.   PHYSICAL EXAM:    VS:  BP 116/66    Pulse (!) 57    Ht 5' 2.5" (1.588 m)    Wt 117 lb 3.2 oz (53.2 kg)    SpO2 98%    BMI 21.09 kg/m     Repeat blood pressure by me was 114/68.  Pulse was 58.  Wt Readings from Last 3 Encounters:  05/29/21 117 lb 3.2 oz (53.2 kg)  01/28/21 114 lb 9.6 oz (52 kg)  12/14/20 110 lb 6.4 oz (50.1 kg)    General: Alert, oriented, no distress.  Skin: normal turgor, no rashes, warm and dry HEENT: Normocephalic, atraumatic. Pupils equal round and reactive to light; sclera anicteric; extraocular muscles intact;  Nose without nasal septal hypertrophy Mouth/Parynx benign; Mallinpatti scale 3 Neck: No JVD, no carotid bruits; normal carotid upstroke Lungs: clear to ausculatation and percussion; no wheezing or rales Chest wall: without tenderness to palpitation Heart: PMI not displaced, RRR, s1 s2 normal, 1/6 systolic murmur, no diastolic murmur, no rubs, gallops, thrills, or heaves Abdomen: soft, nontender; no hepatosplenomehaly, BS+; abdominal aorta nontender and not dilated by palpation. Back: no CVA tenderness Pulses 2+ Musculoskeletal: full range of motion, normal strength, no joint deformities Extremities: no clubbing cyanosis or edema, Homan's sign negative  Neurologic: grossly nonfocal; Cranial nerves grossly wnl Psychologic: Normal mood and affect    Studies/Labs Reviewed:   May 29, 2021 ECG (independently read by me): Sinus bradycardia at 57, PR 110 msec, no ectopy  January 28, 2021 ECG (independently read by me): Normal sinus rhythm at 60 bpm with isolated PAC and mild sinus arrhythmia.  QTc interval 434 ms.  PR interval today is 122 ms.  There is no evidence for preexcitation.   A second ECG was also done which showed sinus bradycardia at 57 bpm with mild sinus arrhythmia without any PAC;  PR interval at 118 ms.  December 14, 2020 ECG (independently read by me):  Sinus rhythm at 75; short PR interval 92 msec; PACs;QTc 437 msec;   I personally reviewed her ECG from  October 12, 2020 which showed sinus arrhythmia with PACs with short PR interval.  There is no evidence for preexcitation.  Recent Labs: BMP Latest Ref Rng & Units 10/12/2020  Glucose 70 - 99 mg/dL 103(H)  BUN 8 - 23 mg/dL 10  Creatinine 0.44 - 1.00 mg/dL 0.44  Sodium 135 - 145 mmol/L 133(L)  Potassium 3.5 - 5.1 mmol/L  3.7  Chloride 98 - 111 mmol/L 104  CO2 22 - 32 mmol/L 22  Calcium 8.9 - 10.3 mg/dL 8.8(L)     Hepatic Function Latest Ref Rng & Units 10/12/2020  Total Protein 6.5 - 8.1 g/dL 6.3(L)  Albumin 3.5 - 5.0 g/dL 3.3(L)  AST 15 - 41 U/L 150(H)  ALT 0 - 44 U/L 66(H)  Alk Phosphatase 38 - 126 U/L 236(H)  Total Bilirubin 0.3 - 1.2 mg/dL 2.5(H)    CBC Latest Ref Rng & Units 10/12/2020  WBC 4.0 - 10.5 K/uL 4.5  Hemoglobin 12.0 - 15.0 g/dL 10.6(L)  Hematocrit 36.0 - 46.0 % 31.8(L)  Platelets 150 - 400 K/uL 286   Lab Results  Component Value Date   MCV 91.4 10/12/2020   Lab Results  Component Value Date   TSH <0.010 (L) 10/12/2020   No results found for: HGBA1C   BNP No results found for: BNP  ProBNP No results found for: PROBNP   Lipid Panel  No results found for: CHOL, TRIG, HDL, CHOLHDL, VLDL, LDLCALC, LDLDIRECT, LABVLDL   RADIOLOGY: No results found.   Additional studies/ records that were reviewed today include:  I reviewed the records of Dr. Felipa Eth at [place].  I reviewed the records of Dr. Loanne Drilling and ER evaluation.  ECHO: 12/14/2020 IMPRESSIONS   1. Left ventricular ejection fraction, by estimation, is 55 to 60%. Left  ventricular ejection fraction by 3D volume is 56 %. The left ventricle has  normal function. The left ventricle has no regional wall motion  abnormalities. Left ventricular diastolic   parameters were normal. The average left ventricular global longitudinal  strain is -21.4 %. The global longitudinal strain is normal.   2. Right ventricular systolic function is normal. The right ventricular  size is normal. There is normal pulmonary  artery systolic pressure. The  estimated right ventricular systolic pressure is 17.4 mmHg.   3. The mitral valve is normal in structure. Trivial mitral valve  regurgitation. No evidence of mitral stenosis.   4. The aortic valve is tricuspid. Aortic valve regurgitation is not  visualized. Mild aortic valve sclerosis is present, with no evidence of  aortic valve stenosis.   5. The inferior vena cava is normal in size with greater than 50%  respiratory variability, suggesting right atrial pressure of 3 mmHg.  ASSESSMENT:    No diagnosis found.   PLAN:  Ms. Sible Straley is a very pleasant 3 -year-old female who developed sudden onset of hyperthyroidism/Graves' disease in May 2022  which she temporally relates to approximately 5 days after getting her second COVID booster shot.  At initial presentation she began to notice significant increase in heart rate as weakness.  She was started on methimazole on September 19, 2020.  She subsequently presented to [facility] ER at [facility] on October 12, 2020 with generalized weakness and weight loss of 15 pounds over the prior month.   She developed diffuse itching of her skin and it was felt that this may have been related to her methimazole.  She subsequently underwent treatment at [facility] and received radioactive iodine therapy on November 14, 2020.  Her free T4 levels are slowly decreasing and her most recent level was 1.7.  On her thyroid scan she was noted to have mild coronary calcification early in June she had an echo Doppler study at [facility] which was interpreted by Dr. Pamelia Hoit.  She was found to have normal systolic and diastolic function with EF at 62%.  There  was moderate left atrial dilatation as well as mild RA dilatation.  RV was felt to be borderline dilated.  Her mitral valve is structurally normal although there was mild MR.  She was estimated to have possible moderate pulmonary hypertension.  She was started on atenolol 25 mg twice a day with  significant improvement in her palpitations and reduction in her heart rate.  When I saw her, I had recommended she undergo follow-up echo Doppler study for reassessment after treatment of her Graves' disease for further evaluation of her previously detected pulmonary hypertension which was done on January 14, 2021 .  Her echo now shows normal systolic and diastolic function with EF at 65 to 60%.  On this present study, there was no evidence for any atrial enlargement.  Pulmonary artery systolic and right ventricular systolic pressure was normal at 28.6.  There was evidence for mild aortic sclerosis without stenosis.  There was trivial mitral regurgitation.  At her last office visit I suggested slight reduction of atenolol to 25 mg in the morning and 12.5 mg at night.  She subsequently developed increased TSH following her ablation and was started on levothyroxine at initial dose of 88 mcg at [facility].  Apparently, her most recent TSH now demonstrates over suppression at 0.15.  She also has noticed occasional palpitations.  I have recommended she reduce her dose of levothyroxine to at least 75 mcg until she sees her endocrinologist back but may even need further dose reduction.  If she is not having any further palpitations, I will then decrease atenolol to 12.5 mg twice a day for 2 weeks when stable and possibly 12.5 mg daily if she remains stable.  She is followed by Dr. Mindi Junker.  Follow-up lipids will need to be obtained at follow-up for reassessment particularly with her thyroid abnormalities.  .  Medication Adjustments/Labs and Tests Ordered: Current medicines are reviewed at length with the patient today.  Concerns regarding medicines are outlined above.  Medication changes, Labs and Tests ordered today are listed in the Patient Instructions below. There are no Patient Instructions on file for this visit.   Signed, Shelva Majestic, MD  05/29/2021 4:21 PM    [facility] [address], [place], [place], [place]  [address] Phone: [phone]

## 2021-05-29 NOTE — Patient Instructions (Addendum)
Medication Instructions:   Change  in Atenolol  Take 12.5 mg ( 1/2 tablet )  twice a day  after 2 weeks  try reducing it again to 12.5 mg in the morning only , if palpitation increase go back to 12.5 mg ( 1/2 tablet )   *If you need a refill on your cardiac medications before your next appointment, please call your pharmacy*   Lab Work: Not needed    Testing/Procedures: Not needed   Follow-Up: At [facility], you and your health needs are our priority.  As part of our continuing mission to provide you with exceptional heart care, we have created designated Provider Care Teams.  These Care Teams include your primary Cardiologist (physician) and Advanced Practice Providers (APPs -  Physician Assistants and Nurse Practitioners) who all work together to provide you with the care you need, when you need it.     Your next appointment:   3 month(s)  keep appointment already schedule  The format for your next appointment:   In Person  Provider:   Shelva Majestic, MD    Other Instructions

## 2021-06-05 ENCOUNTER — Encounter: Payer: Self-pay | Admitting: Cardiovascular Disease

## 2021-06-18 DIAGNOSIS — H04123 Dry eye syndrome of bilateral lacrimal glands: Secondary | ICD-10-CM | POA: Diagnosis not present

## 2021-06-18 DIAGNOSIS — H2513 Age-related nuclear cataract, bilateral: Secondary | ICD-10-CM | POA: Diagnosis not present

## 2021-07-01 DIAGNOSIS — E0590 Thyrotoxicosis, unspecified without thyrotoxic crisis or storm: Secondary | ICD-10-CM | POA: Diagnosis not present

## 2021-07-09 DIAGNOSIS — M85851 Other specified disorders of bone density and structure, right thigh: Secondary | ICD-10-CM | POA: Diagnosis not present

## 2021-07-09 DIAGNOSIS — M85852 Other specified disorders of bone density and structure, left thigh: Secondary | ICD-10-CM | POA: Diagnosis not present

## 2021-07-22 DIAGNOSIS — D509 Iron deficiency anemia, unspecified: Secondary | ICD-10-CM | POA: Diagnosis not present

## 2021-07-22 DIAGNOSIS — I272 Pulmonary hypertension, unspecified: Secondary | ICD-10-CM | POA: Diagnosis not present

## 2021-07-22 DIAGNOSIS — Z0000 Encounter for general adult medical examination without abnormal findings: Secondary | ICD-10-CM | POA: Diagnosis not present

## 2021-07-22 DIAGNOSIS — E0590 Thyrotoxicosis, unspecified without thyrotoxic crisis or storm: Secondary | ICD-10-CM | POA: Diagnosis not present

## 2021-07-22 DIAGNOSIS — G629 Polyneuropathy, unspecified: Secondary | ICD-10-CM | POA: Diagnosis not present

## 2021-07-22 DIAGNOSIS — E222 Syndrome of inappropriate secretion of antidiuretic hormone: Secondary | ICD-10-CM | POA: Diagnosis not present

## 2021-07-22 DIAGNOSIS — Z780 Asymptomatic menopausal state: Secondary | ICD-10-CM | POA: Diagnosis not present

## 2021-07-22 DIAGNOSIS — K219 Gastro-esophageal reflux disease without esophagitis: Secondary | ICD-10-CM | POA: Diagnosis not present

## 2021-07-22 DIAGNOSIS — Z79899 Other long term (current) drug therapy: Secondary | ICD-10-CM | POA: Diagnosis not present

## 2021-07-22 DIAGNOSIS — M8588 Other specified disorders of bone density and structure, other site: Secondary | ICD-10-CM | POA: Diagnosis not present

## 2021-07-22 DIAGNOSIS — R9431 Abnormal electrocardiogram [ECG] [EKG]: Secondary | ICD-10-CM | POA: Diagnosis not present

## 2021-07-24 ENCOUNTER — Emergency Department (HOSPITAL_COMMUNITY): Payer: Medicare Other

## 2021-07-24 ENCOUNTER — Encounter (HOSPITAL_COMMUNITY): Payer: Self-pay | Admitting: Emergency Medicine

## 2021-07-24 ENCOUNTER — Observation Stay (HOSPITAL_COMMUNITY)
Admission: EM | Admit: 2021-07-24 | Discharge: 2021-07-26 | Disposition: A | Discharge status: Home / Self Care | Payer: Medicare Other | Attending: Internal Medicine | Admitting: Internal Medicine

## 2021-07-24 ENCOUNTER — Other Ambulatory Visit: Payer: Self-pay

## 2021-07-24 DIAGNOSIS — Z20822 Contact with and (suspected) exposure to covid-19: Secondary | ICD-10-CM | POA: Diagnosis not present

## 2021-07-24 DIAGNOSIS — R110 Nausea: Secondary | ICD-10-CM | POA: Diagnosis not present

## 2021-07-24 DIAGNOSIS — E876 Hypokalemia: Secondary | ICD-10-CM | POA: Insufficient documentation

## 2021-07-24 DIAGNOSIS — Z79899 Other long term (current) drug therapy: Secondary | ICD-10-CM | POA: Insufficient documentation

## 2021-07-24 DIAGNOSIS — I10 Essential (primary) hypertension: Secondary | ICD-10-CM | POA: Insufficient documentation

## 2021-07-24 DIAGNOSIS — R1032 Left lower quadrant pain: Secondary | ICD-10-CM | POA: Diagnosis not present

## 2021-07-24 DIAGNOSIS — E039 Hypothyroidism, unspecified: Secondary | ICD-10-CM | POA: Diagnosis present

## 2021-07-24 DIAGNOSIS — I6782 Cerebral ischemia: Secondary | ICD-10-CM | POA: Diagnosis not present

## 2021-07-24 DIAGNOSIS — D649 Anemia, unspecified: Secondary | ICD-10-CM | POA: Diagnosis not present

## 2021-07-24 DIAGNOSIS — R1084 Generalized abdominal pain: Secondary | ICD-10-CM | POA: Diagnosis not present

## 2021-07-24 DIAGNOSIS — R55 Syncope and collapse: Secondary | ICD-10-CM | POA: Diagnosis not present

## 2021-07-24 DIAGNOSIS — R42 Dizziness and giddiness: Secondary | ICD-10-CM | POA: Diagnosis not present

## 2021-07-24 DIAGNOSIS — G319 Degenerative disease of nervous system, unspecified: Secondary | ICD-10-CM | POA: Diagnosis not present

## 2021-07-24 DIAGNOSIS — R29818 Other symptoms and signs involving the nervous system: Secondary | ICD-10-CM | POA: Diagnosis not present

## 2021-07-24 LAB — COMPREHENSIVE METABOLIC PANEL
ALT: 14 U/L (ref 0–44)
AST: 31 U/L (ref 15–41)
Albumin: 4.2 g/dL (ref 3.5–5.0)
Alkaline Phosphatase: 70 U/L (ref 38–126)
Anion gap: 8 (ref 5–15)
BUN: 14 mg/dL (ref 8–23)
CO2: 24 mmol/L (ref 22–32)
Calcium: 9.2 mg/dL (ref 8.9–10.3)
Chloride: 100 mmol/L (ref 98–111)
Creatinine, Ser: 0.6 mg/dL (ref 0.44–1.00)
GFR, Estimated: >60 mL/min (ref >60)
Glucose, Bld: 108 mg/dL — ABNORMAL HIGH (ref 70–99)
Potassium: 4.4 mmol/L (ref 3.5–5.1)
Sodium: 132 mmol/L — ABNORMAL LOW (ref 135–145)
Total Bilirubin: 0.6 mg/dL (ref 0.3–1.2)
Total Protein: 7.3 g/dL (ref 6.5–8.1)

## 2021-07-24 LAB — I-STAT CHEM 8, ED
BUN: 13 mg/dL (ref 8–23)
Calcium, Ion: 1.19 mmol/L (ref 1.15–1.40)
Chloride: 98 mmol/L (ref 98–111)
Creatinine, Ser: 0.4 mg/dL — ABNORMAL LOW (ref 0.44–1.00)
Glucose, Bld: 106 mg/dL — ABNORMAL HIGH (ref 70–99)
HCT: 38 % (ref 36.0–46.0)
Hemoglobin: 12.9 g/dL (ref 12.0–15.0)
Potassium: 4.2 mmol/L (ref 3.5–5.1)
Sodium: 133 mmol/L — ABNORMAL LOW (ref 135–145)
TCO2: 23 mmol/L (ref 22–32)

## 2021-07-24 LAB — CBC
HCT: 36.2 % (ref 36.0–46.0)
Hemoglobin: 12.5 g/dL (ref 12.0–15.0)
MCH: 31.9 pg (ref 26.0–34.0)
MCHC: 34.5 g/dL (ref 30.0–36.0)
MCV: 92.3 fL (ref 80.0–100.0)
Platelets: 330 10*3/uL (ref 150–400)
RBC: 3.92 MIL/uL (ref 3.87–5.11)
RDW: 13.3 % (ref 11.5–15.5)
WBC: 8 10*3/uL (ref 4.0–10.5)
nRBC: 0 % (ref 0.0–0.2)

## 2021-07-24 LAB — TROPONIN I (HIGH SENSITIVITY)
Troponin I (High Sensitivity): 3 ng/L (ref <18)
Troponin I (High Sensitivity): 3 ng/L (ref <18)

## 2021-07-24 LAB — DIFFERENTIAL
Abs Immature Granulocytes: 0.03 10*3/uL (ref 0.00–0.07)
Basophils Absolute: 0 10*3/uL (ref 0.0–0.1)
Basophils Relative: 0 %
Eosinophils Absolute: 0 10*3/uL (ref 0.0–0.5)
Eosinophils Relative: 0 %
Immature Granulocytes: 0 %
Lymphocytes Relative: 8 %
Lymphs Abs: 0.6 10*3/uL — ABNORMAL LOW (ref 0.7–4.0)
Monocytes Absolute: 0.2 10*3/uL (ref 0.1–1.0)
Monocytes Relative: 3 %
Neutro Abs: 7.1 10*3/uL (ref 1.7–7.7)
Neutrophils Relative %: 89 %

## 2021-07-24 LAB — PROTIME-INR
INR: 1 (ref 0.8–1.2)
Prothrombin Time: 12.9 seconds (ref 11.4–15.2)

## 2021-07-24 LAB — ETHANOL: Alcohol, Ethyl (B): <10 mg/dL (ref <10)

## 2021-07-24 LAB — RESP PANEL BY RT-PCR (FLU A&B, COVID) ARPGX2
Influenza A by PCR: NEGATIVE
Influenza B by PCR: NEGATIVE
SARS Coronavirus 2 by RT PCR: NEGATIVE

## 2021-07-24 LAB — MAGNESIUM: Magnesium: 1.9 mg/dL (ref 1.7–2.4)

## 2021-07-24 LAB — APTT: aPTT: 26 seconds (ref 24–36)

## 2021-07-24 IMAGING — MR MR MRA HEAD W/O CM
1 series · 19 of 48 positions shown · IV contrast (gadavist)
Comparison: Head CT [DATE]

CLINICAL DATA: Acute neurologic deficit

EXAM:
MRI HEAD WITHOUT CONTRAST
MRA HEAD WITHOUT CONTRAST
MRA NECK WITHOUT AND WITH CONTRAST
TECHNIQUE: Multiplanar, multi-echo pulse sequences of the brain and surrounding
structures were acquired without intravenous contrast. Angiographic
images of the Circle of Willis were acquired using MRA technique
without intravenous contrast. Angiographic images of the neck were
acquired using MRA technique without and with intravenous contrast.
Carotid stenosis measurements (when applicable) are obtained
utilizing NASCET criteria, using the distal internal carotid
diameter as the denominator.
CONTRAST:  5mL GADAVIST GADOBUTROL 1 MMOL/ML IV SOLN

[Series 8: TOF · axial · 0.6mm · 0.35mm/px · z∈[-76,+21]mm · 19 of 172 slices shown]
[im 1/172]
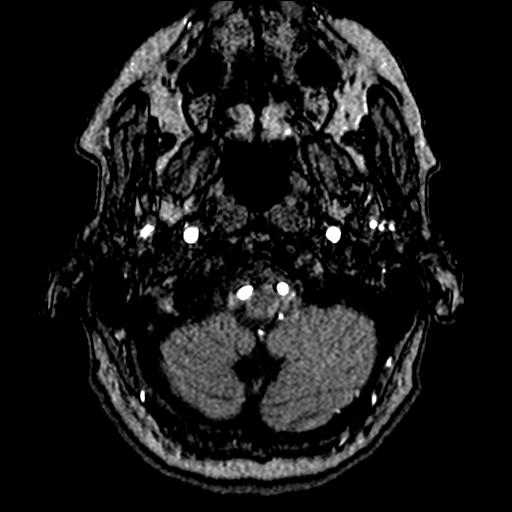
[im 4/172]
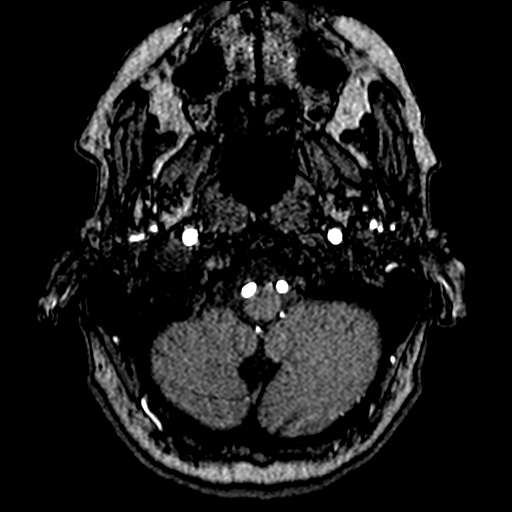
[im 8/172]
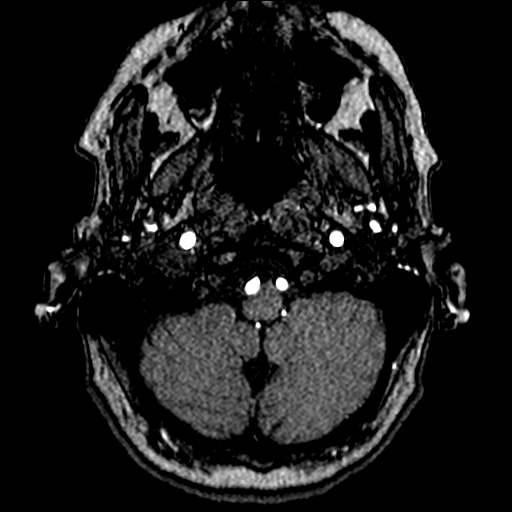
[im 11/172]
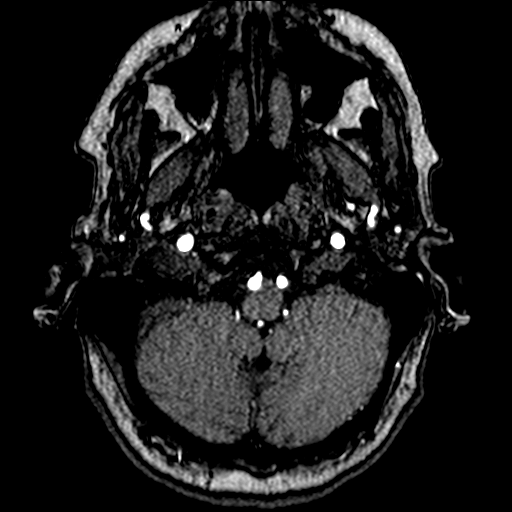
[im 15/172]
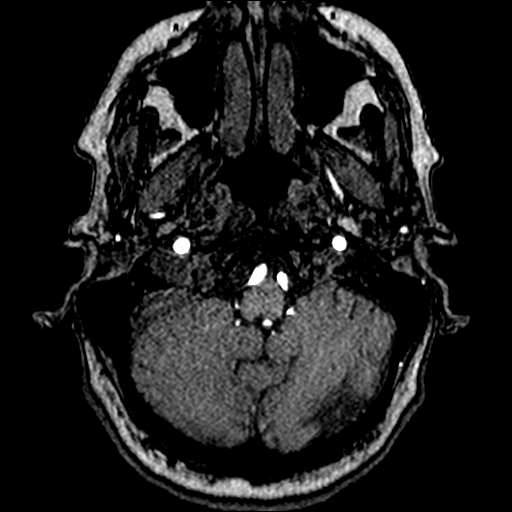
[im 19/172]
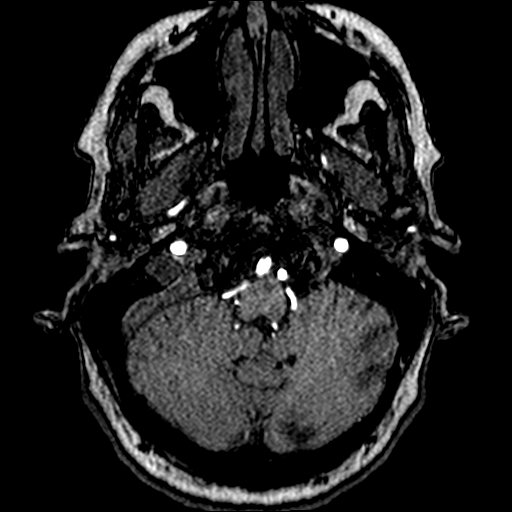
[im 22/172]
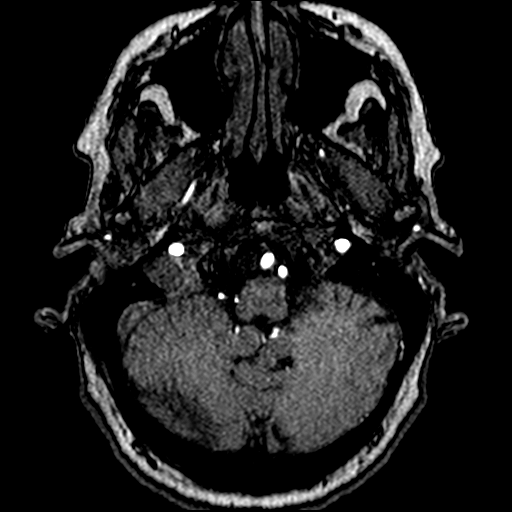
[im 26/172]
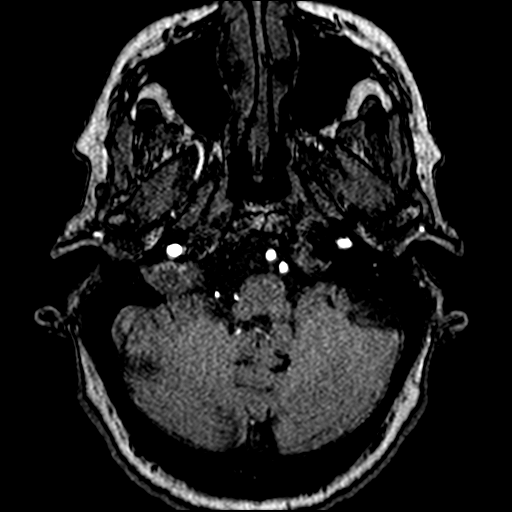
[im 30/172]
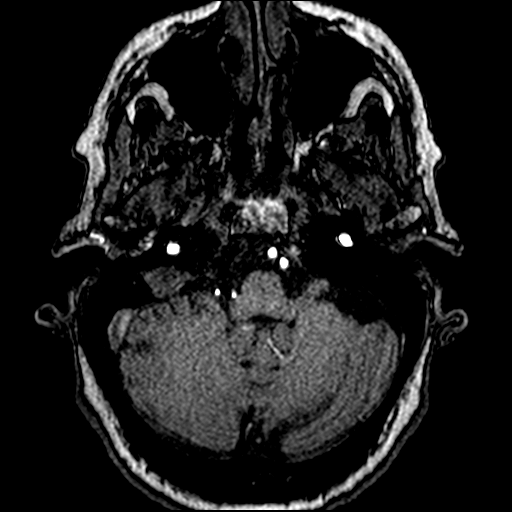
[im 33/172]
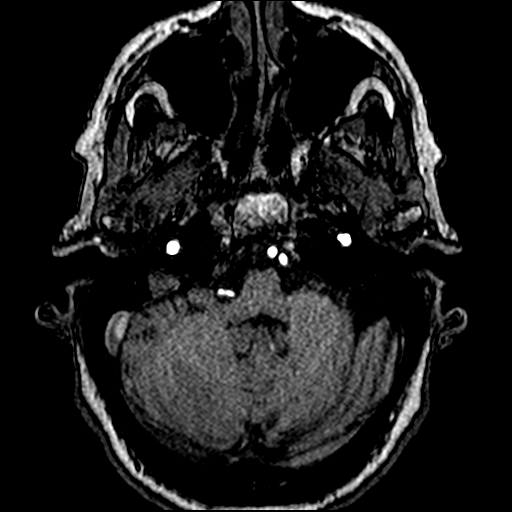
[im 37/172]
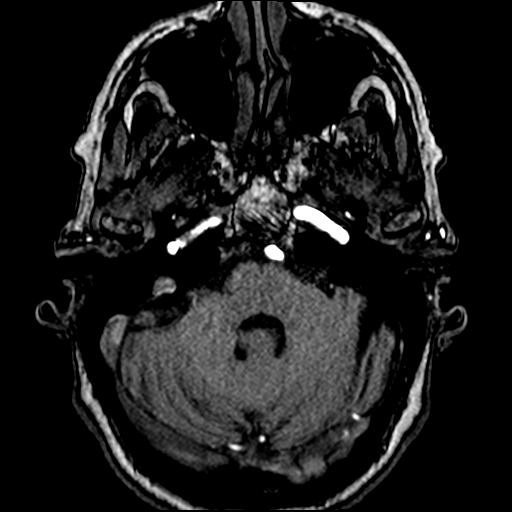
[im 55/172]
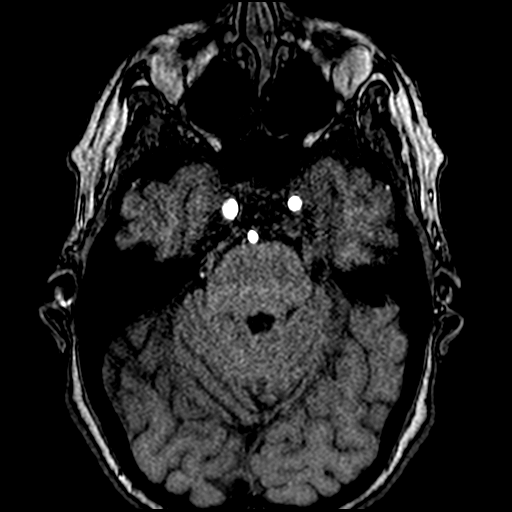
[im 77/172]
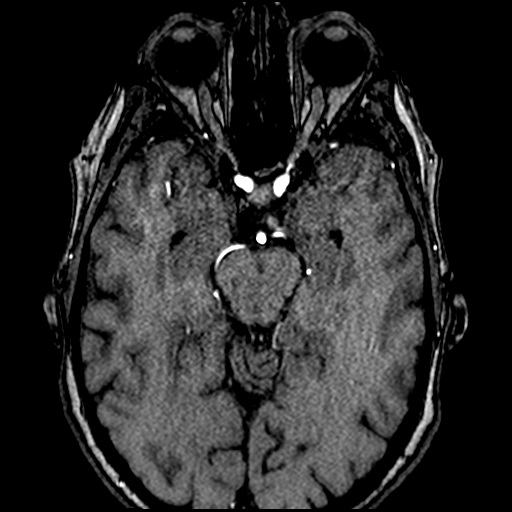
[im 88/172]
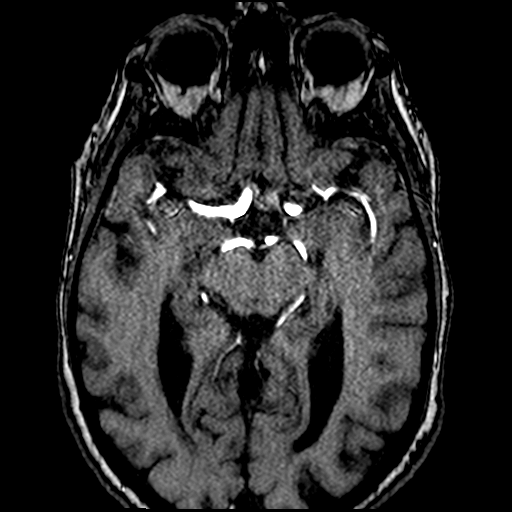
[im 99/172]
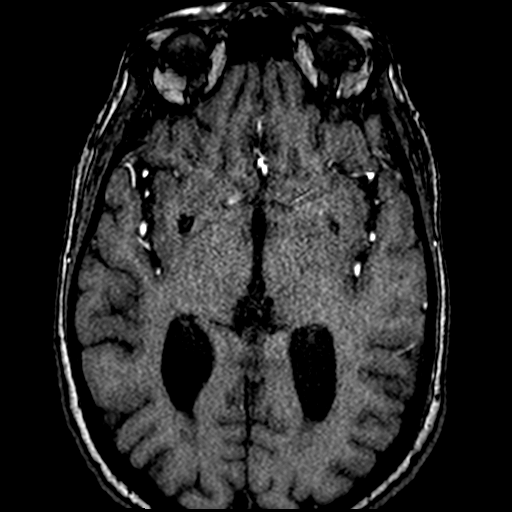
[im 121/172]
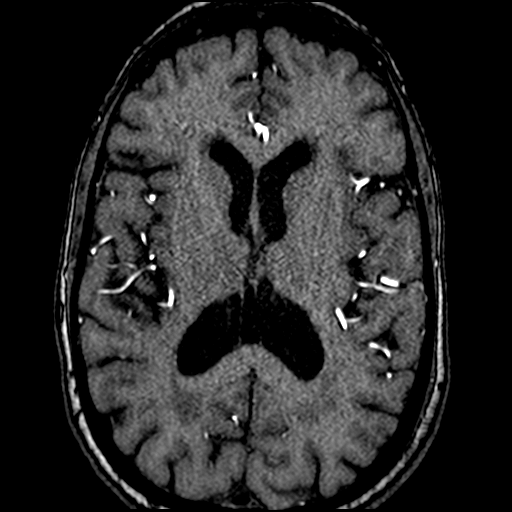
[im 142/172]
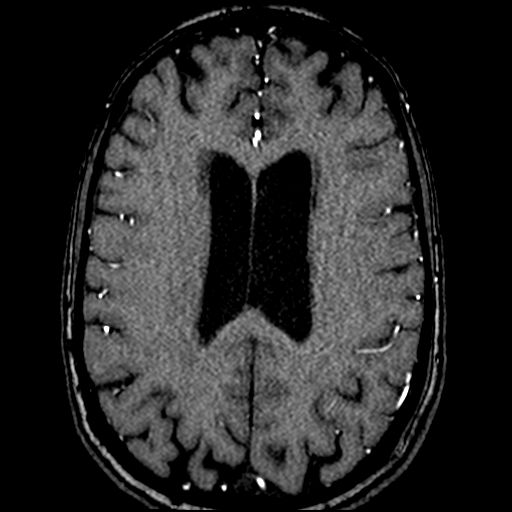
[im 146/172]
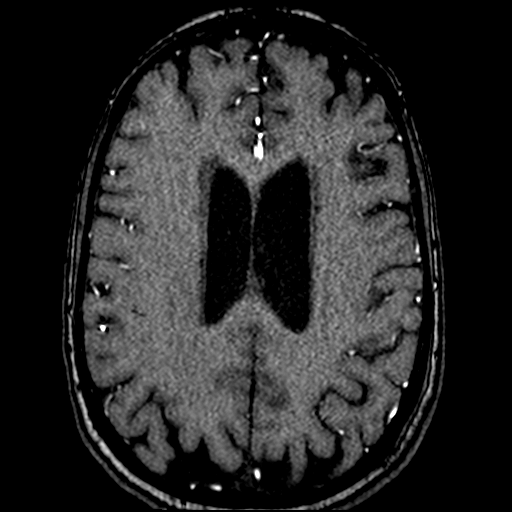
[im 164/172]
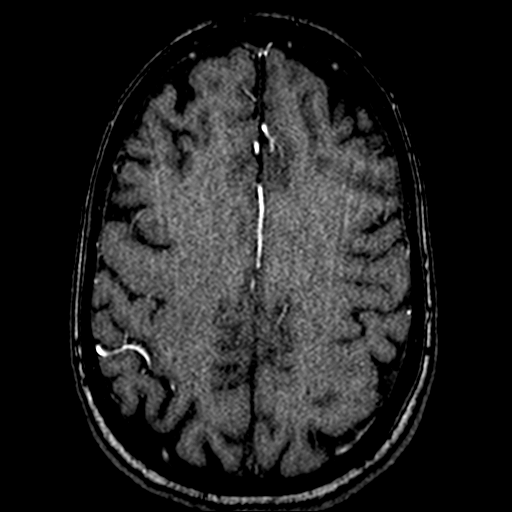

[19 of 48 positions shown; findings below may reference images not displayed]

FINDINGS: MRI HEAD FINDINGS

Brain: No acute infarct, mass effect or extra-axial collection. No
acute or chronic hemorrhage. There is multifocal hyperintense
T2-weighted signal within the white matter. Generalized volume loss
without a clear lobar predilection. The midline structures are
normal.

Vascular: Major flow voids are preserved.

Skull and upper cervical spine: Normal calvarium and skull base.
Visualized upper cervical spine and soft tissues are normal.

Sinuses/Orbits:No paranasal sinus fluid levels or advanced mucosal
thickening. No mastoid or middle ear effusion. Normal orbits.

MRA HEAD FINDINGS

POSTERIOR CIRCULATION:

--Vertebral arteries: Normal

--Inferior cerebellar arteries: Normal.

--Basilar artery: Normal.

--Superior cerebellar arteries: Normal.

--Posterior cerebral arteries: Normal.

ANTERIOR CIRCULATION:

--Intracranial internal carotid arteries: Normal.

--Anterior cerebral arteries (ACA): Normal.

--Middle cerebral arteries (MCA): Normal.

ANATOMIC VARIANTS: None

MRA NECK FINDINGS

Aortic arch: Normal

Right carotid system: Normal

Left carotid system: Normal

Vertebral arteries: Codominant and normal

Other: None
IMPRESSION: 1. No acute intracranial abnormality.
2. Normal MRA of the head and neck.
3. Mild chronic small vessel disease and volume loss.

## 2021-07-24 IMAGING — MR MR HEAD W/O CM
10 series · 48 of 48 positions shown · IV contrast (gadavist)
Comparison: Head CT [DATE]

CLINICAL DATA: Acute neurologic deficit

EXAM:
MRI HEAD WITHOUT CONTRAST
MRA HEAD WITHOUT CONTRAST
MRA NECK WITHOUT AND WITH CONTRAST
TECHNIQUE: Multiplanar, multi-echo pulse sequences of the brain and surrounding
structures were acquired without intravenous contrast. Angiographic
images of the Circle of Willis were acquired using MRA technique
without intravenous contrast. Angiographic images of the neck were
acquired using MRA technique without and with intravenous contrast.
Carotid stenosis measurements (when applicable) are obtained
utilizing NASCET criteria, using the distal internal carotid
diameter as the denominator.
CONTRAST:  5mL GADAVIST GADOBUTROL 1 MMOL/ML IV SOLN

[Series 5: DWI · axial · 3.0mm · 1.36mm/px · z∈[-47,+92]mm · 9 of 96 slices shown (1 of 2)]
[im 1/96]
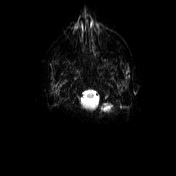
[im 12/96]
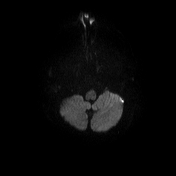
[im 24/96]
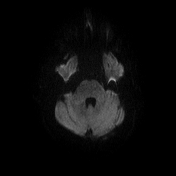
[im 36/96]
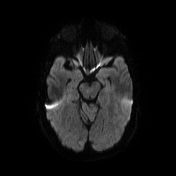
[im 48/96]
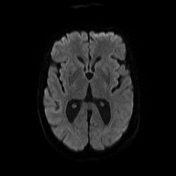
[im 60/96]
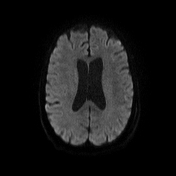
[im 72/96]
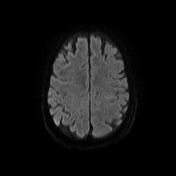
[im 84/96]
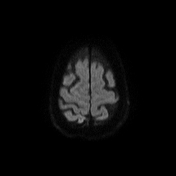
[im 96/96]
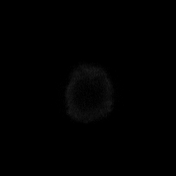

[Series 6: DWI · axial · 3.0mm · 1.36mm/px · z∈[-47,+92]mm · 5 of 48 slices shown (2 of 2)]
[im 1/48]
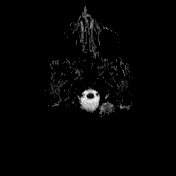
[im 12/48]
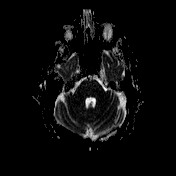
[im 24/48]
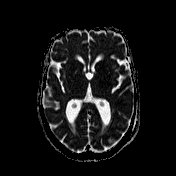
[im 36/48]
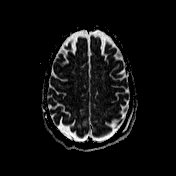
[im 48/48]
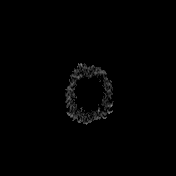

[Series 7: T1 · sagittal · 5.0mm · 0.75mm/px · 2 of 24 slices shown (1 of 2)]
[im 1/24]
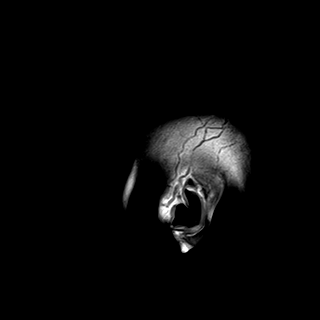
[im 24/24]
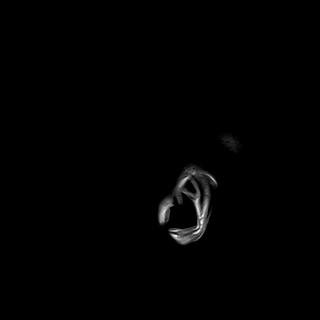

[Series 8: T2 · axial · 5.0mm · 0.62mm/px · z∈[-56,+104]mm · 2 of 26 slices shown (1 of 2)]
[im 1/26]
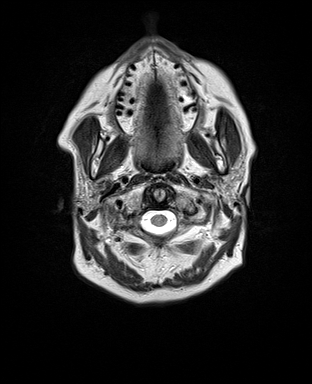
[im 26/26]
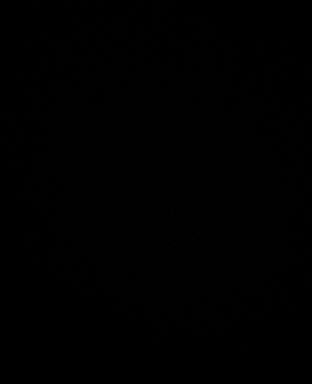

[Series 9: swi_images · axial · 3.0mm · 0.75mm/px · z∈[-57,+106]mm · 5 of 56 slices shown]
[im 1/56]
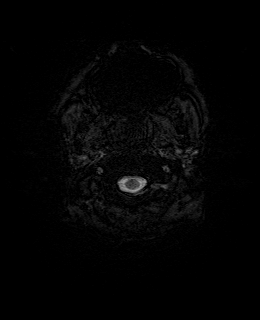
[im 14/56]
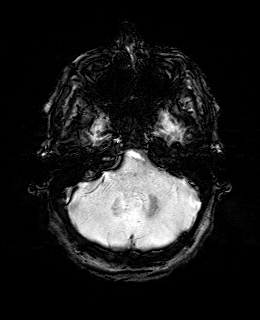
[im 28/56]
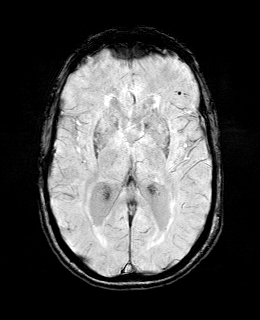
[im 42/56]
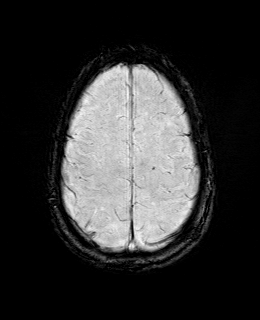
[im 56/56]
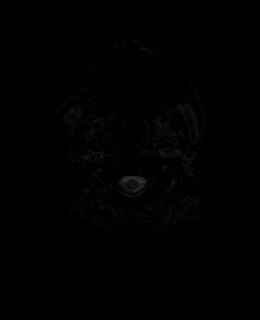

[Series 11: FLAIR · axial · 3.0mm · 0.75mm/px · z∈[-51,+100]mm · 4 of 52 slices shown]
[im 1/52]
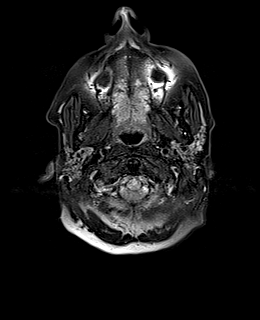
[im 18/52]
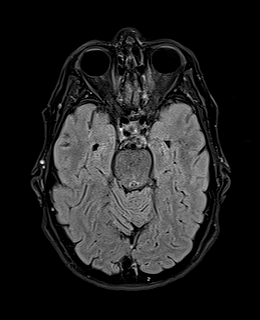
[im 35/52]
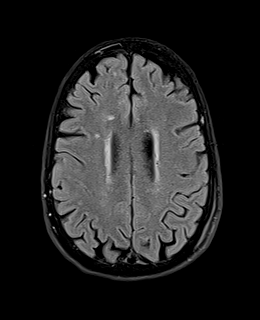
[im 52/52]
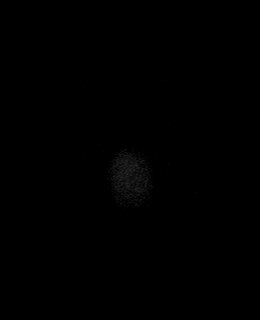

[Series 12: T1 · axial · 1.0mm · 0.94mm/px · z∈[-46,+95]mm · 12 of 144 slices shown (2 of 2)]
[im 1/144]
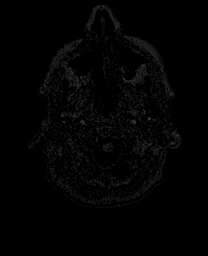
[im 14/144]
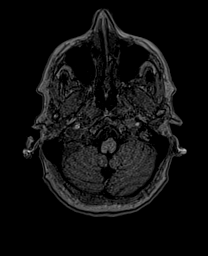
[im 27/144]
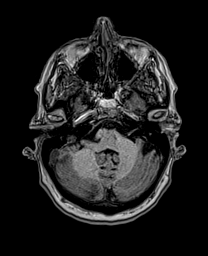
[im 40/144]
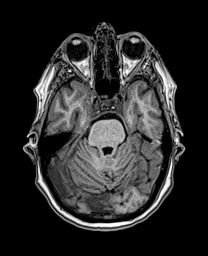
[im 53/144]
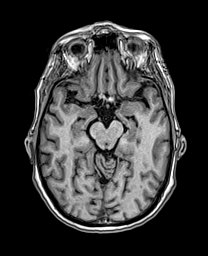
[im 66/144]
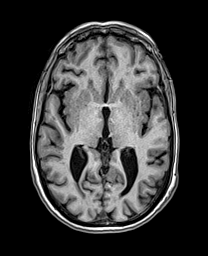
[im 79/144]
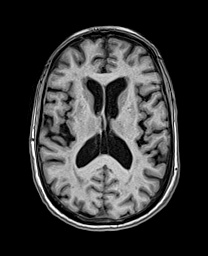
[im 92/144]
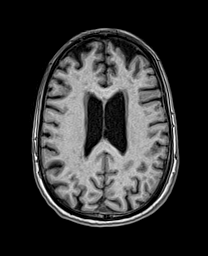
[im 105/144]
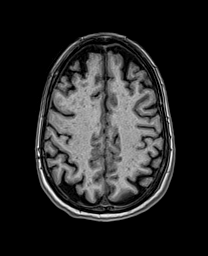
[im 118/144]
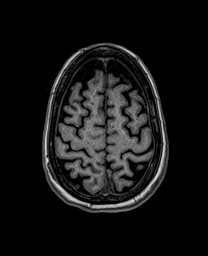
[im 131/144]
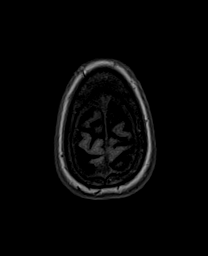
[im 144/144]
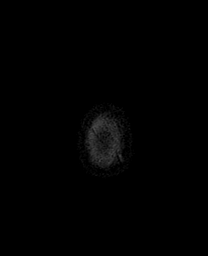

[Series 13: cor dwi_tracew · coronal · 5.0mm · 1.53mm/px · 5 of 56 slices shown]
[im 1/56]
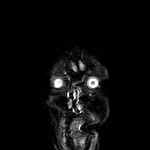
[im 14/56]
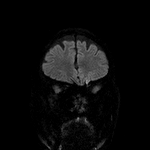
[im 28/56]
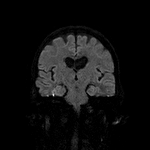
[im 42/56]
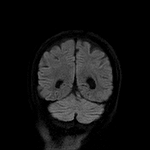
[im 56/56]
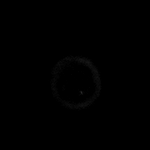

[Series 14: cor dwi_adc · coronal · 5.0mm · 1.53mm/px · 2 of 28 slices shown]
[im 1/28]
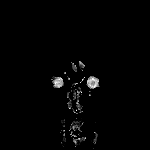
[im 28/28]
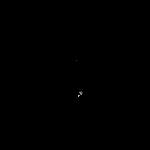

[Series 15: T2 · coronal · 5.0mm · 0.57mm/px · 2 of 28 slices shown (2 of 2)]
[im 1/28]
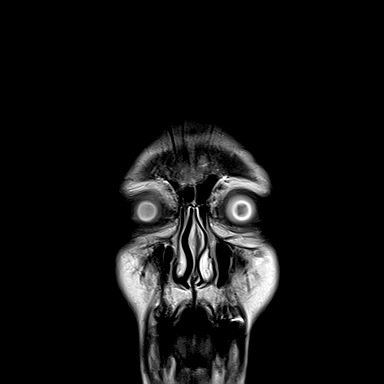
[im 28/28]
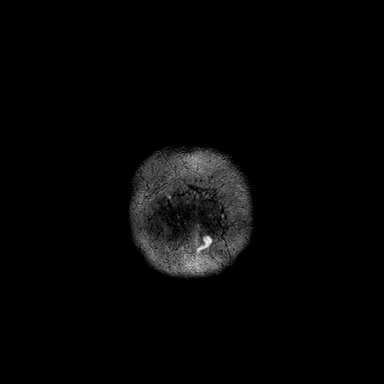

[48 of 48 positions shown; findings below may reference images not displayed]

FINDINGS: MRI HEAD FINDINGS

Brain: No acute infarct, mass effect or extra-axial collection. No
acute or chronic hemorrhage. There is multifocal hyperintense
T2-weighted signal within the white matter. Generalized volume loss
without a clear lobar predilection. The midline structures are
normal.

Vascular: Major flow voids are preserved.

Skull and upper cervical spine: Normal calvarium and skull base.
Visualized upper cervical spine and soft tissues are normal.

Sinuses/Orbits:No paranasal sinus fluid levels or advanced mucosal
thickening. No mastoid or middle ear effusion. Normal orbits.

MRA HEAD FINDINGS

POSTERIOR CIRCULATION:

--Vertebral arteries: Normal

--Inferior cerebellar arteries: Normal.

--Basilar artery: Normal.

--Superior cerebellar arteries: Normal.

--Posterior cerebral arteries: Normal.

ANTERIOR CIRCULATION:

--Intracranial internal carotid arteries: Normal.

--Anterior cerebral arteries (ACA): Normal.

--Middle cerebral arteries (MCA): Normal.

ANATOMIC VARIANTS: None

MRA NECK FINDINGS

Aortic arch: Normal

Right carotid system: Normal

Left carotid system: Normal

Vertebral arteries: Codominant and normal

Other: None
IMPRESSION: 1. No acute intracranial abnormality.
2. Normal MRA of the head and neck.
3. Mild chronic small vessel disease and volume loss.

## 2021-07-24 IMAGING — CT CT HEAD W/O CM
3 series · 14 of 47 positions shown, 16 images · non-contrast
Comparison: Prior head CT [DATE].

CLINICAL DATA: Provided history: Neuro deficit, acute, stroke
suspected. Additional history provided: Dizziness.



[Series 2: head wo · axial · 0.48mm/px · z∈[+1231,+1366]mm · 8 of 33 slices shown, 10 images]
[im 3/33  brain]
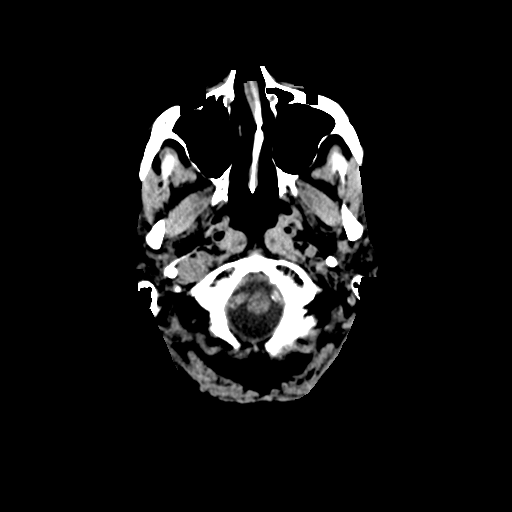
[im 3/33  bone]
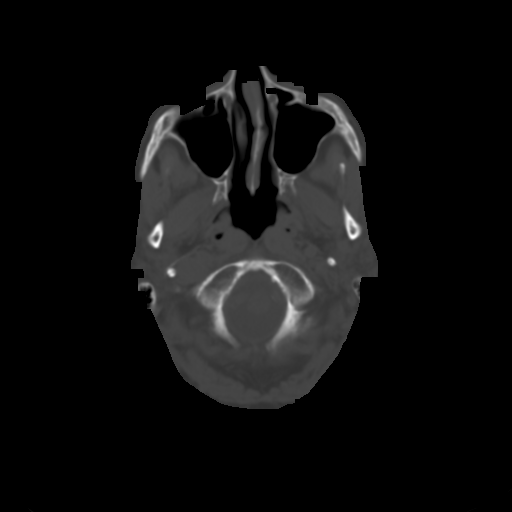
[im 7/33  brain]
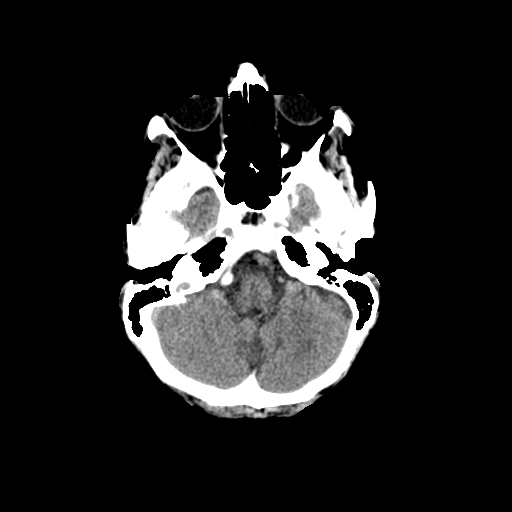
[im 10/33  brain]
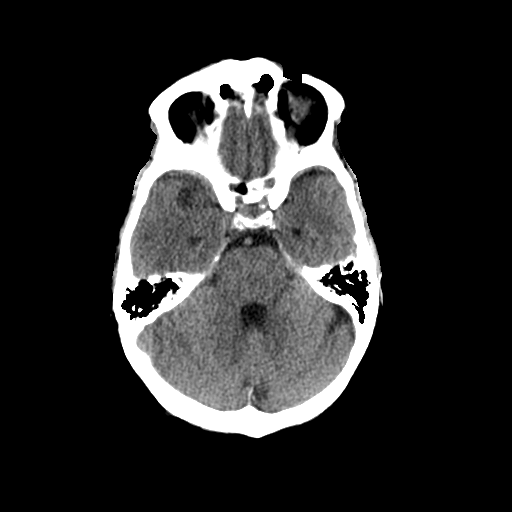
[im 15/33  brain]
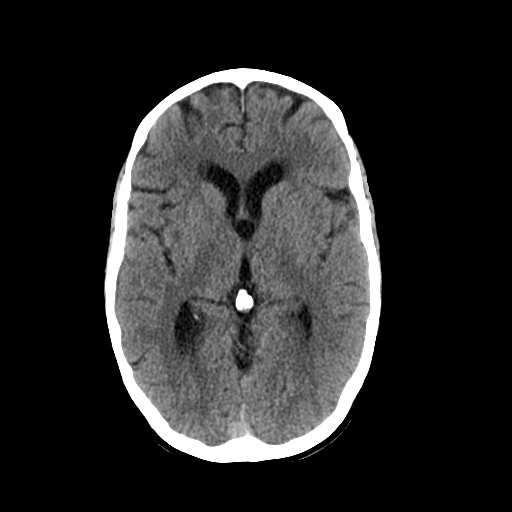
[im 18/33  brain]
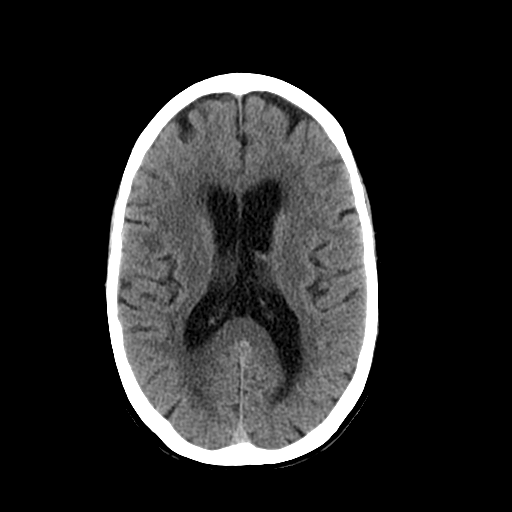
[im 18/33  bone]
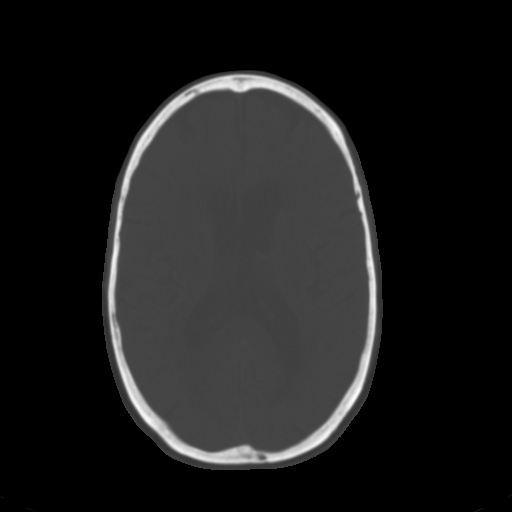
[im 23/33  brain]
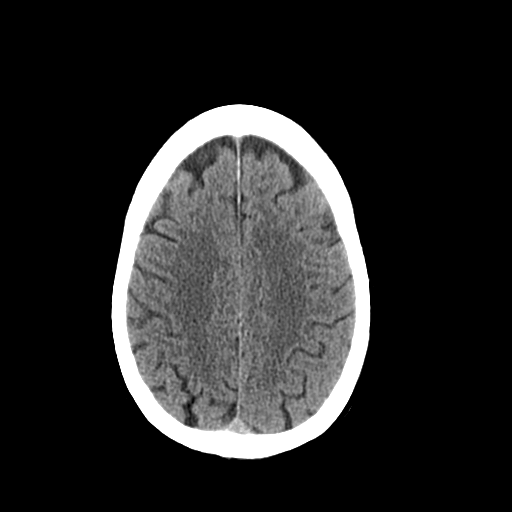
[im 26/33  brain]
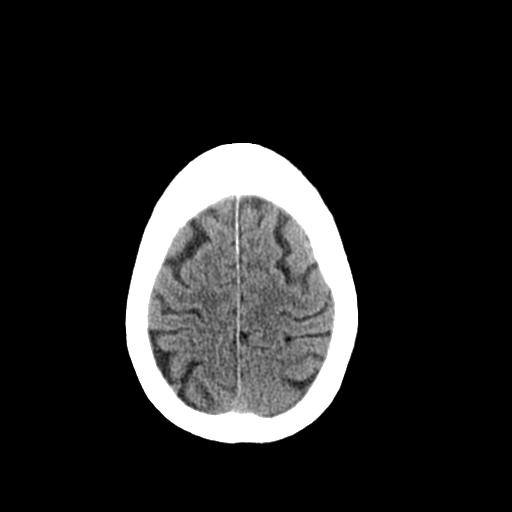
[im 30/33  brain]
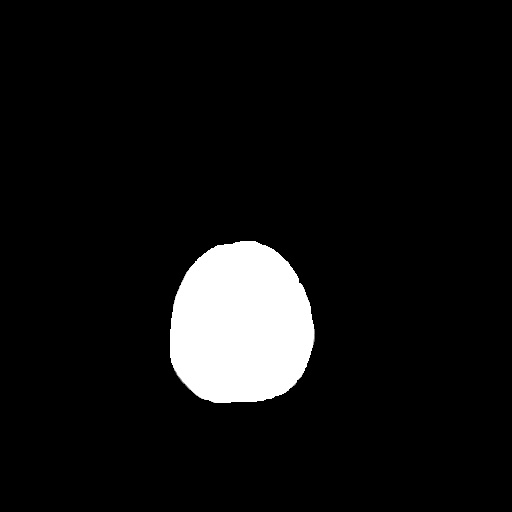

[Series 4: coronal soft tissue · coronal · 0.32mm/px · 3 of 67 slices shown]
[im 23/67  brain]
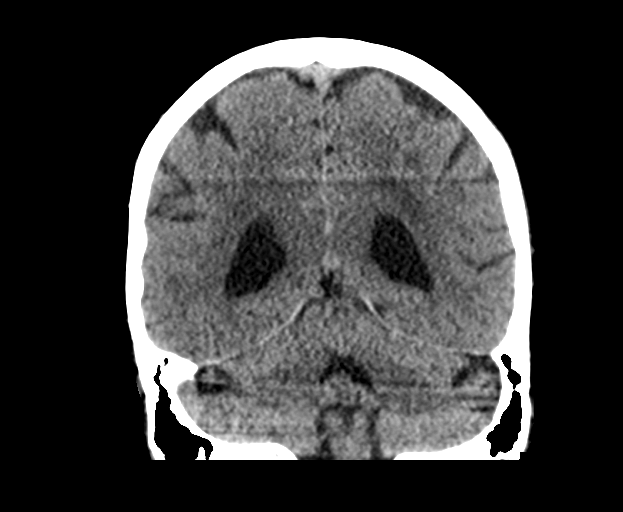
[im 30/67  brain]
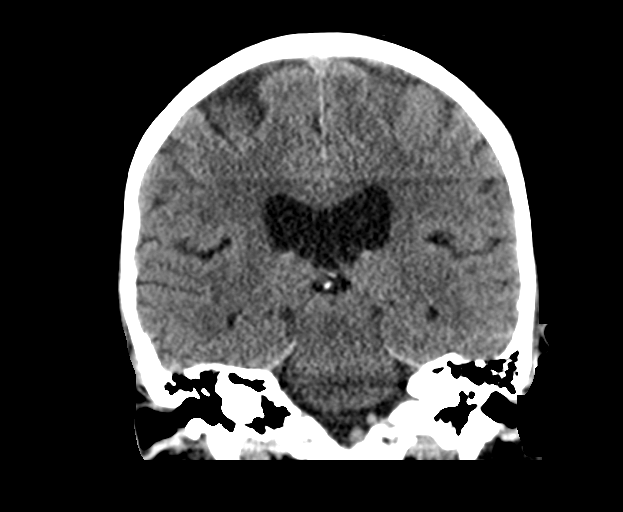
[im 37/67  brain]
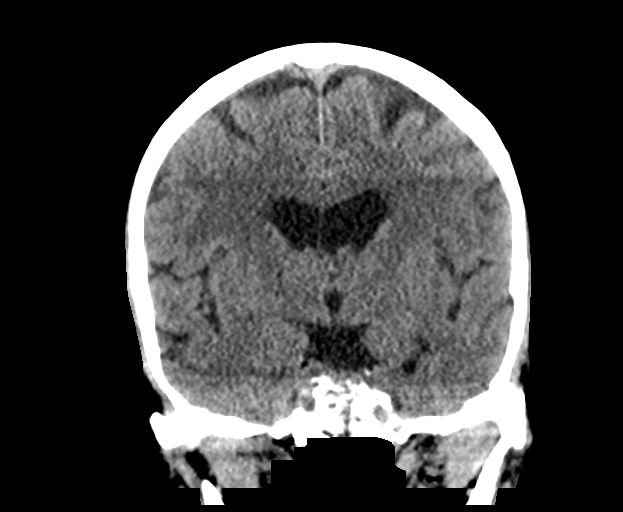

[Series 5: sagittal soft tissue · sagittal · 0.32mm/px · 3 of 48 slices shown]
[im 16/48  brain]
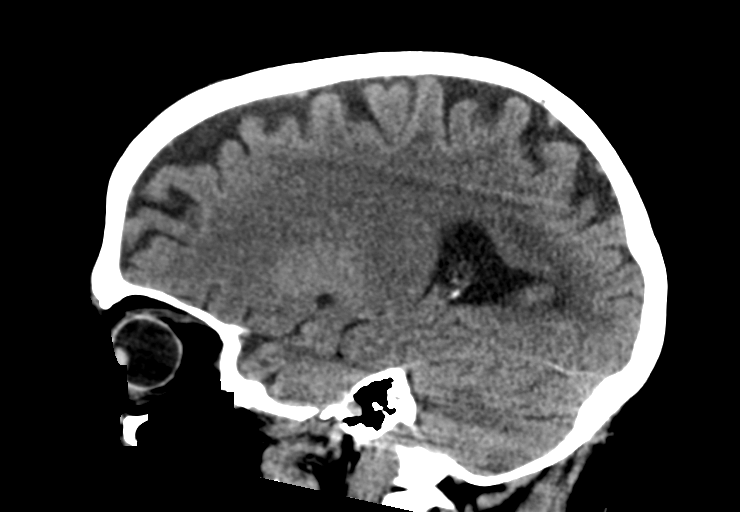
[im 24/48  brain]
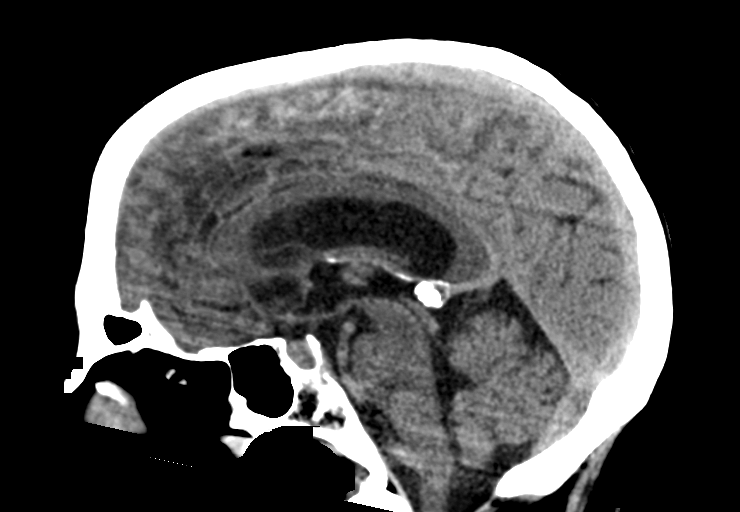
[im 32/48  brain]
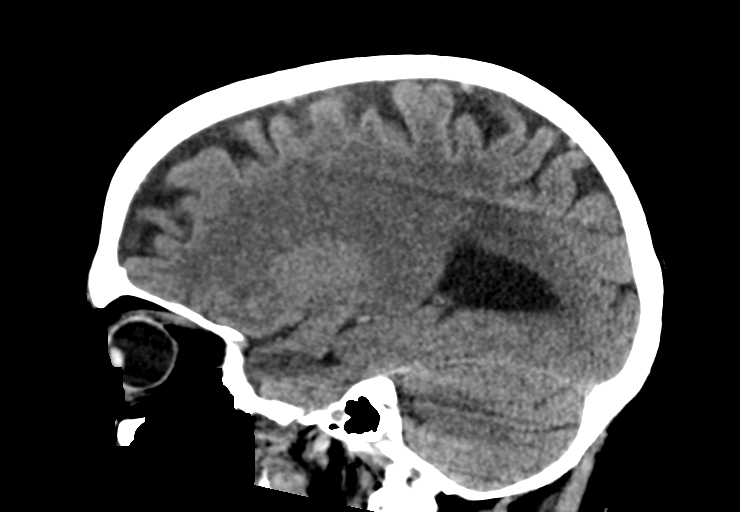

[14 of 47 positions shown; findings below may reference images not displayed]

FINDINGS: Brain:

Mild generalized cerebral atrophy.

Mild-to-moderate patchy and ill-defined hypoattenuation within the
cerebral white matter, nonspecific but compatible with chronic small
vessel disease.

Prominent perivascular space within the inferior right basal
ganglia.

There is no acute intracranial hemorrhage.

No demarcated cortical infarct.

No extra-axial fluid collection.

No evidence of an intracranial mass.

No midline shift.

Vascular: No hyperdense vessel.  Atherosclerotic calcifications.

Skull: Normal. Negative for fracture or focal lesion.

Sinuses/Orbits: Visualized orbits show no acute finding. No
significant paranasal sinus disease at the imaged levels.
IMPRESSION: No evidence of acute intracranial abnormality.

Mild-to-moderate chronic small vessel ischemic changes within the
cerebral white matter, progressed from the prior head CT of
[DATE]

Mild generalized cerebral atrophy.

## 2021-07-24 IMAGING — MR MR MRA NECK WO/W CM
4 series · 37 of 48 positions shown · IV contrast (GADAVIST)
Comparison: Head CT [DATE]

CLINICAL DATA: Acute neurologic deficit

EXAM:
MRI HEAD WITHOUT CONTRAST
MRA HEAD WITHOUT CONTRAST
MRA NECK WITHOUT AND WITH CONTRAST
TECHNIQUE: Multiplanar, multi-echo pulse sequences of the brain and surrounding
structures were acquired without intravenous contrast. Angiographic
images of the Circle of Willis were acquired using MRA technique
without intravenous contrast. Angiographic images of the neck were
acquired using MRA technique without and with intravenous contrast.
Carotid stenosis measurements (when applicable) are obtained
utilizing NASCET criteria, using the distal internal carotid
diameter as the denominator.
CONTRAST:  5mL GADAVIST GADOBUTROL 1 MMOL/ML IV SOLN

[Series 9: (id)_cor_post · coronal · 0.8mm · 0.78mm/px · 23 of 95 slices shown]
[im 1/95]
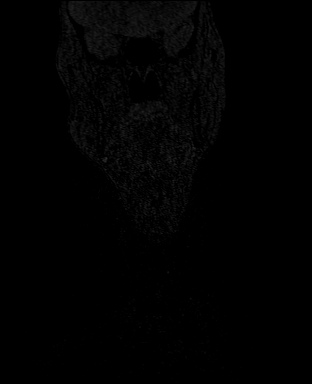
[im 5/95]
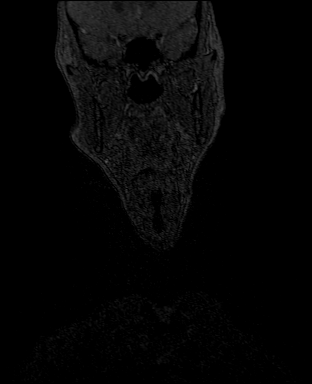
[im 9/95]
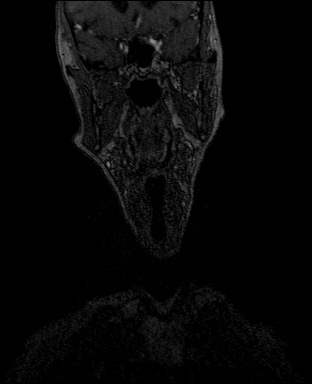
[im 13/95]
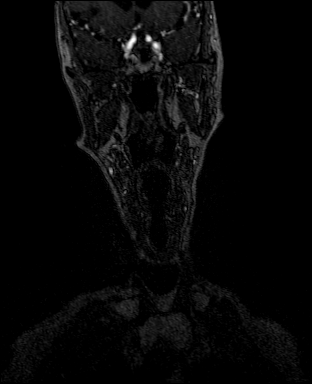
[im 18/95]
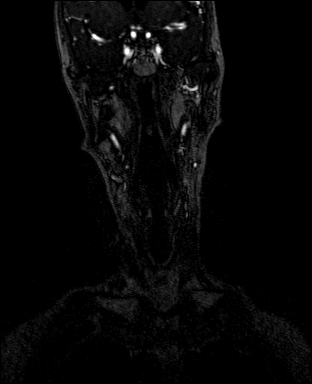
[im 22/95]
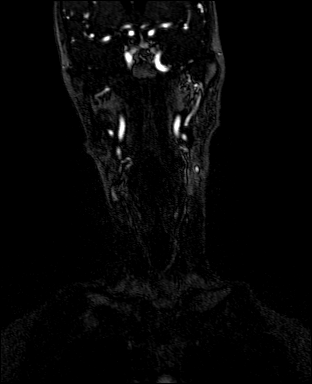
[im 26/95]
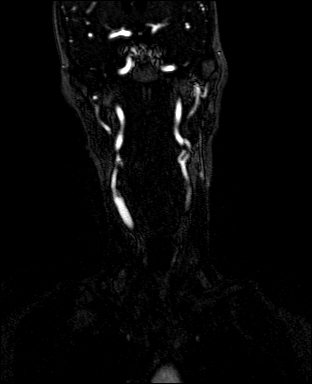
[im 30/95]
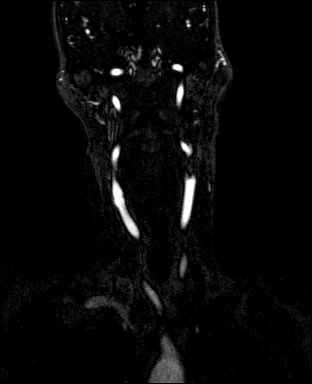
[im 35/95]
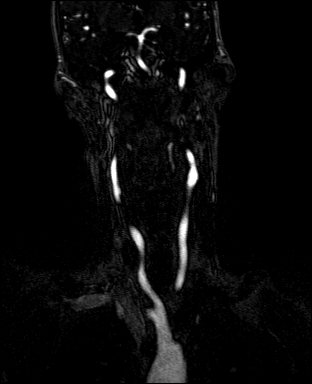
[im 39/95]
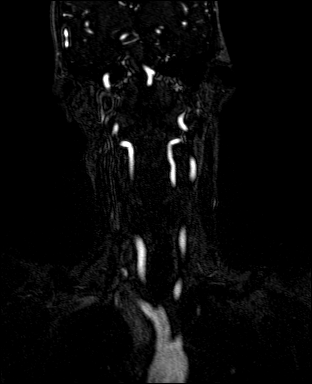
[im 43/95]
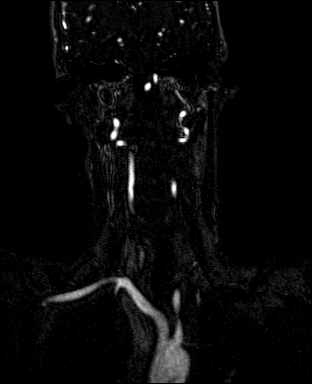
[im 48/95]
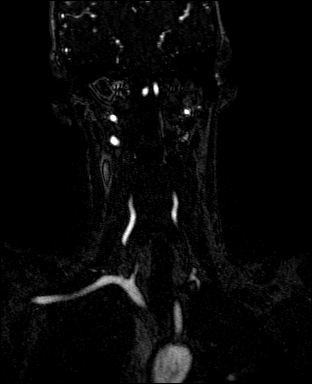
[im 52/95]
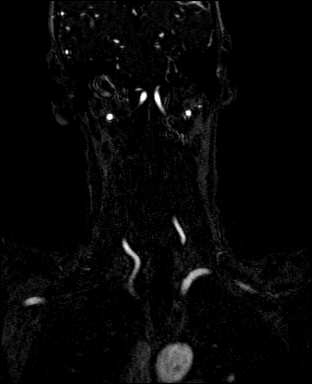
[im 56/95]
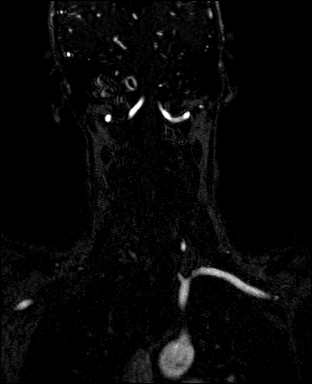
[im 60/95]
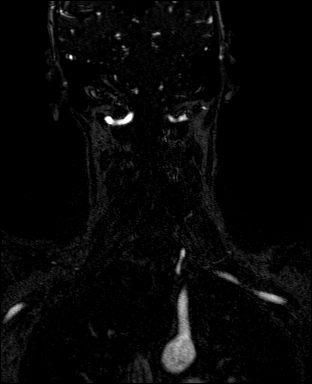
[im 65/95]
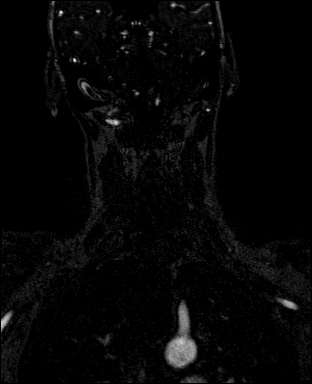
[im 69/95]
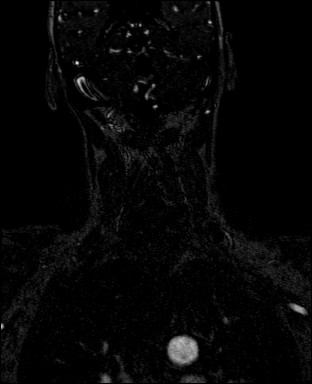
[im 73/95]
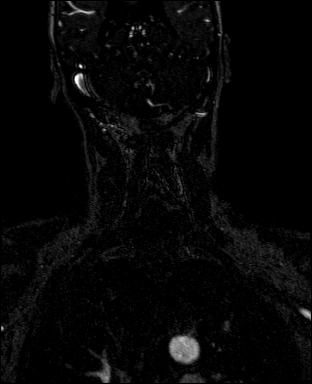
[im 77/95]
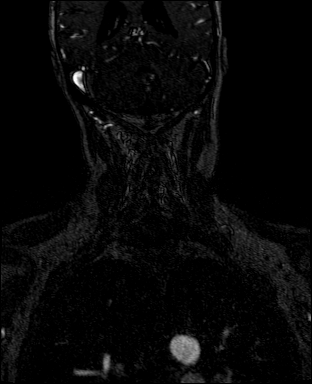
[im 82/95]
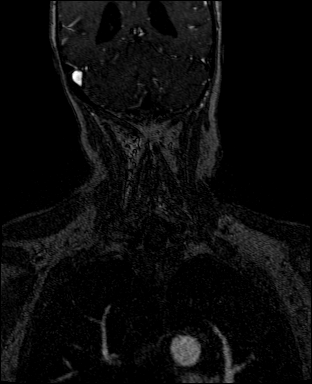
[im 86/95]
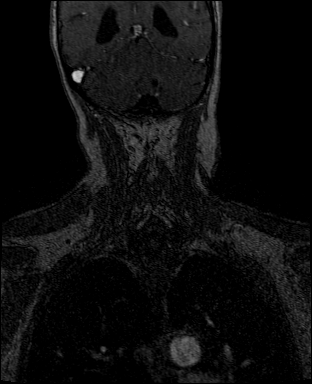
[im 90/95]
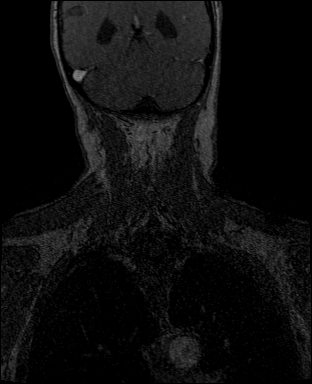
[im 95/95]
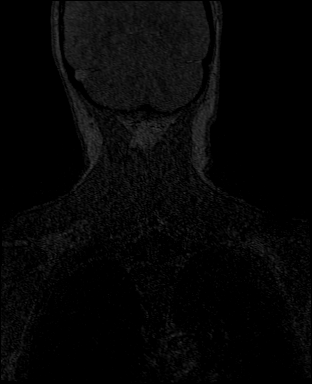

[Series 10: (id)_cor_post_sub · coronal · 0.8mm · 0.78mm/px · 12 of 95 slices shown]
[im 1/95]
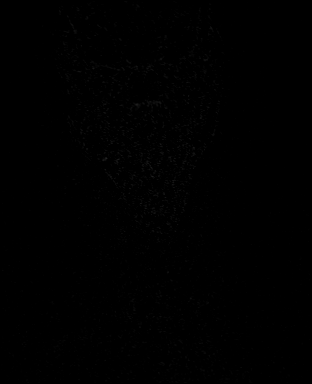
[im 5/95]
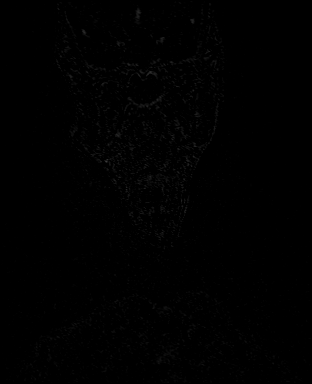
[im 13/95]
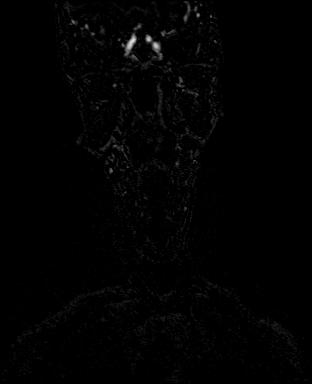
[im 18/95]
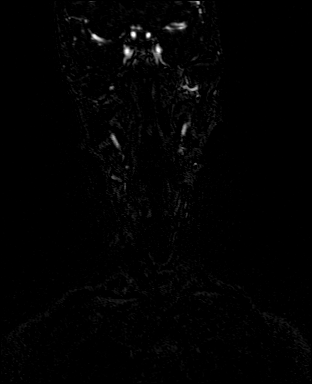
[im 30/95]
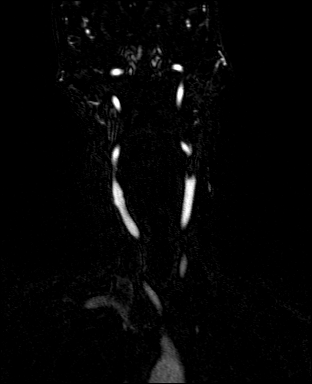
[im 43/95]
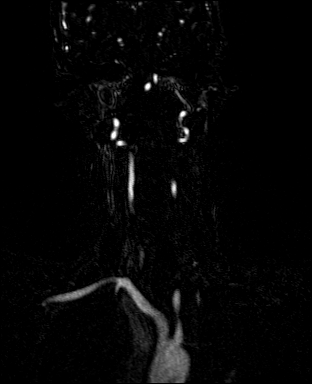
[im 48/95]
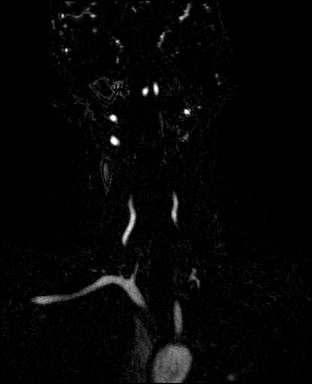
[im 52/95]
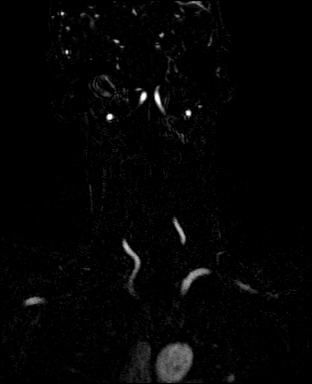
[im 65/95]
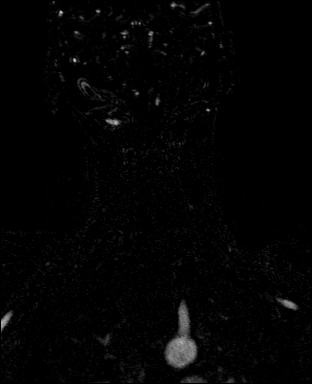
[im 77/95]
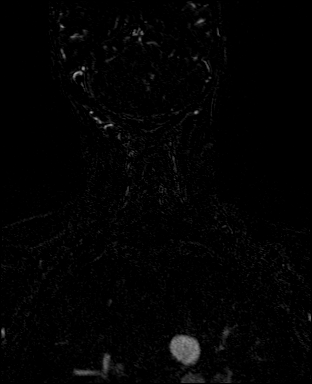
[im 82/95]
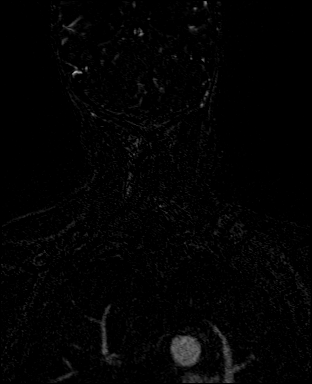
[im 90/95]
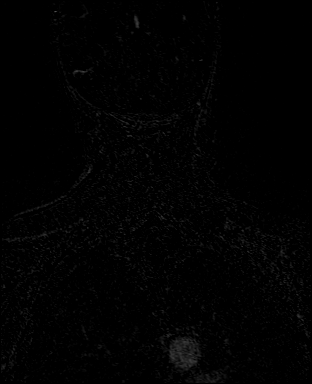

[Series 101: rt side · axial · 0.62mm/px · 1 of 1 slices shown]
[im 1/1]
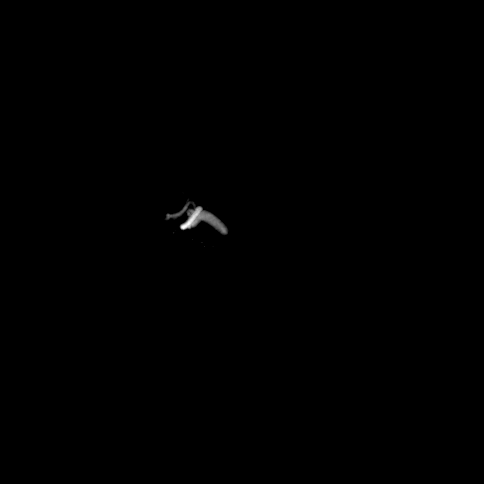

[Series 102: lt side · axial · 0.62mm/px · 1 of 1 slices shown]
[im 1/1]
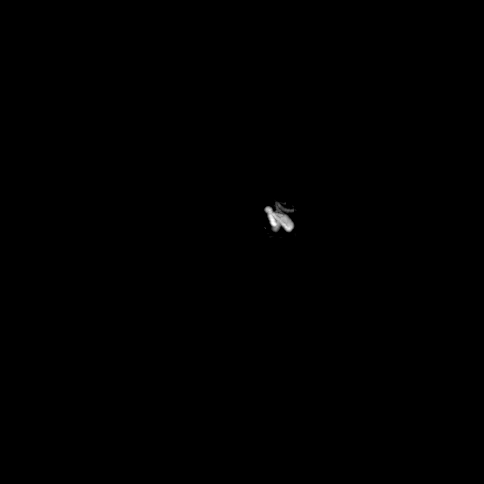

[37 of 48 positions shown; findings below may reference images not displayed]

FINDINGS: MRI HEAD FINDINGS

Brain: No acute infarct, mass effect or extra-axial collection. No
acute or chronic hemorrhage. There is multifocal hyperintense
T2-weighted signal within the white matter. Generalized volume loss
without a clear lobar predilection. The midline structures are
normal.

Vascular: Major flow voids are preserved.

Skull and upper cervical spine: Normal calvarium and skull base.
Visualized upper cervical spine and soft tissues are normal.

Sinuses/Orbits:No paranasal sinus fluid levels or advanced mucosal
thickening. No mastoid or middle ear effusion. Normal orbits.

MRA HEAD FINDINGS

POSTERIOR CIRCULATION:

--Vertebral arteries: Normal

--Inferior cerebellar arteries: Normal.

--Basilar artery: Normal.

--Superior cerebellar arteries: Normal.

--Posterior cerebral arteries: Normal.

ANTERIOR CIRCULATION:

--Intracranial internal carotid arteries: Normal.

--Anterior cerebral arteries (ACA): Normal.

--Middle cerebral arteries (MCA): Normal.

ANATOMIC VARIANTS: None

MRA NECK FINDINGS

Aortic arch: Normal

Right carotid system: Normal

Left carotid system: Normal

Vertebral arteries: Codominant and normal

Other: None
IMPRESSION: 1. No acute intracranial abnormality.
2. Normal MRA of the head and neck.
3. Mild chronic small vessel disease and volume loss.

## 2021-07-24 MED ORDER — SODIUM CHLORIDE 0.9 % IV SOLN: Freq: Once | INTRAVENOUS | Status: AC

## 2021-07-24 MED ORDER — MECLIZINE HCL 25 MG PO TABS
25.0000 mg | ORAL_TABLET | Freq: Three times a day (TID) | ORAL | Status: DC | PRN
  Administered 2021-07-25 (×2): 25 mg via ORAL
  Filled 2021-07-24 (×2): qty 1

## 2021-07-24 MED ORDER — DIAZEPAM 5 MG/ML IJ SOLN
2.5000 mg | Freq: Once | INTRAMUSCULAR | Status: AC
  Administered 2021-07-24: 2.5 mg via INTRAVENOUS
  Filled 2021-07-24: qty 2

## 2021-07-24 MED ORDER — MECLIZINE HCL 25 MG PO TABS
25.0000 mg | ORAL_TABLET | Freq: Once | ORAL | Status: AC
Start: 2021-07-24 — End: 2021-07-24
  Administered 2021-07-24: 25 mg via ORAL
  Filled 2021-07-24: qty 1

## 2021-07-24 MED ORDER — GADOBUTROL 1 MMOL/ML IV SOLN
5.0000 mL | Freq: Once | INTRAVENOUS | Status: AC | PRN
  Administered 2021-07-24: 5 mL via INTRAVENOUS

## 2021-07-24 MED ORDER — ACETAMINOPHEN 650 MG RE SUPP: 650.0000 mg | Freq: Four times a day (QID) | RECTAL | Status: DC | PRN

## 2021-07-24 MED ORDER — ACETAMINOPHEN 325 MG PO TABS
650.0000 mg | ORAL_TABLET | Freq: Four times a day (QID) | ORAL | Status: DC | PRN
  Administered 2021-07-25: 650 mg via ORAL
  Filled 2021-07-24: qty 2

## 2021-07-24 MED ORDER — ACETAMINOPHEN 500 MG PO TABS
1000.0000 mg | ORAL_TABLET | Freq: Once | ORAL | Status: AC
  Administered 2021-07-24: 1000 mg via ORAL
  Filled 2021-07-24: qty 2

## 2021-07-24 NOTE — ED Notes (Signed)
Patient still in MRI.  

## 2021-07-24 NOTE — ED Triage Notes (Signed)
Pt BIB EMS from home, c/o dizziness. Got up this AM to feed her cat, woke up four hours later on the floor. Endorses nausea anytime she makes the slightest moves. Frequent dry heaves with no active vomit. Hot and clammy with left lower ABD pain. Refused zofran, said it makes her constipation. ? ?BP 130/70 ?P 80 ?spO2 97% RA ?T 98.1 ?CBG 120 ?

## 2021-07-24 NOTE — ED Notes (Signed)
ED TO INPATIENT HANDOFF REPORT ? ?Name/Age/Gender ?Sara Carson ?76 y.o. ?female ? ?Code Status ? ?  ?Code Status Orders  ?(From admission, onward)  ?  ? ? ?  ? ?  Start     Ordered  ? 07/24/21 2120  Full code  Continuous       ? 07/24/21 2119  ? ?  ?  ? ?  ? ?Code Status History   ? ? This patient has a current code status but no historical code status.  ? ?  ? ?Advance Directive Documentation   ? ?Flowsheet Row Most Recent Value  ?Type of Advance Directive Healthcare Power of [place], Living will  ?Pre-existing out of facility DNR order (yellow form or pink MOST form) --  ?"MOST" Form in Place? --  ? ?  ? ? ?Home/SNF/Other ?Home ? ?Chief Complaint ?Vertigo [R42] ? ?Level of Care/Admitting Diagnosis ?ED Disposition   ? ? ED Disposition  ?Admit  ? Condition  ?--  ? Comment  ?Hospital Area: [facility] [[id number]] ? Level of Care: Telemetry [5] ? Admit to tele based on following criteria: Monitor for Ischemic changes ? May place patient in observation at [facility] or [facility] if equivalent level of care is available:: No ? Covid Evaluation: Confirmed COVID Negative ? Diagnosis: Vertigo [207257] ? Admitting Physician: Rhetta Mura [[id number]] ? Attending Physician: Rhetta Mura [[id number]] ?  ?  ? ?  ? ? ?Medical History ?Past Medical History:  ?Diagnosis Date  ? Anemia   ? Depression   ? Graves disease 10/04/2020  ? Shingles   ? ? ?Allergies ?Allergies  ?Allergen Reactions  ? Levothyroxine Other (See Comments)  ?  HAD A CARDIAC REACTION @ 88 micrograms  ? Methimazole Itching, Nausea Only and Other (See Comments)  ?  Elevated liver enzymes ? ?Other reaction(s): Extreme nausea, increase in liver labs, itching, Other (See Comments) ?Elevated liver enzymes ?Elevated liver enzymes ?  ? Codeine   ?  Other reaction(s): intolerant ?Other reaction(s): Other (See Comments)  ? Levaquin [Levofloxacin In D5w]   ? Levofloxacin   ?  Other reaction(s): crazy feeling, Other (See Comments)  ?  Amoxicillin Rash  ?  Other reaction(s): hives, Other (See Comments)  ? ? ?IV Location/Drains/Wounds ?Patient Lines/Drains/Airways Status   ? ? Active Line/Drains/Airways   ? ? Name Placement date Placement time Site Days  ? Peripheral IV 07/24/21 20 G Right Antecubital 07/24/21  --  Antecubital  less than 1  ? ?  ?  ? ?  ? ? ?Labs/Imaging ?Results for orders placed or performed during the hospital encounter of 07/24/21 (from the past 48 hour(s))  ?Resp Panel by RT-PCR (Flu A&B, Covid) Nasopharyngeal Swab     Status: None  ? Collection Time: 07/24/21  4:42 PM  ? Specimen: Nasopharyngeal Swab; Nasopharyngeal(NP) swabs in vial transport medium  ?Result Value Ref Range  ? SARS Coronavirus 2 by RT PCR NEGATIVE NEGATIVE  ?  Comment: (NOTE) ?SARS-CoV-2 target nucleic acids are NOT DETECTED. ? ?The SARS-CoV-2 RNA is generally detectable in upper respiratory ?specimens during the acute phase of infection. The lowest ?concentration of SARS-CoV-2 viral copies this assay can detect is ?138 copies/mL. A negative result does not preclude SARS-Cov-2 ?infection and should not be used as the sole basis for treatment or ?other patient management decisions. A negative result may occur with  ?improper specimen collection/handling, submission of specimen other ?than nasopharyngeal swab, presence of viral mutation(s) within the ?areas targeted  by this assay, and inadequate number of viral ?copies(<138 copies/mL). A negative result must be combined with ?clinical observations, patient history, and epidemiological ?information. The expected result is Negative. ? ?Fact Sheet for Patients:  ?EntrepreneurPulse.com.au ? ?Fact Sheet for Healthcare Providers:  ?IncredibleEmployment.be ? ?This test is no t yet approved or cleared by the [place] FDA and  ?has been authorized for detection and/or diagnosis of SARS-CoV-2 by ?FDA under an Emergency Use Authorization (EUA). This EUA will remain  ?in effect  (meaning this test can be used) for the duration of the ?COVID-19 declaration under Section 564(b)(1) of the Act, 21 ?U.S.C.section 360bbb-3(b)(1), unless the authorization is terminated  ?or revoked sooner.  ? ? ?  ? Influenza A by PCR NEGATIVE NEGATIVE  ? Influenza B by PCR NEGATIVE NEGATIVE  ?  Comment: (NOTE) ?The Xpert Xpress SARS-CoV-2/FLU/RSV plus assay is intended as an aid ?in the diagnosis of influenza from Nasopharyngeal swab specimens and ?should not be used as a sole basis for treatment. Nasal washings and ?aspirates are unacceptable for Xpert Xpress SARS-CoV-2/FLU/RSV ?testing. ? ?Fact Sheet for Patients: ?EntrepreneurPulse.com.au ? ?Fact Sheet for Healthcare Providers: ?IncredibleEmployment.be ? ?This test is not yet approved or cleared by the [place] FDA and ?has been authorized for detection and/or diagnosis of SARS-CoV-2 by ?FDA under an Emergency Use Authorization (EUA). This EUA will remain ?in effect (meaning this test can be used) for the duration of the ?COVID-19 declaration under Section 564(b)(1) of the Act, 21 U.S.C. ?section 360bbb-3(b)(1), unless the authorization is terminated or ?revoked. ? ?Performed at [facility], [address] ?[place], [place] [address] ?  ?Ethanol     Status: None  ? Collection Time: 07/24/21  4:42 PM  ?Result Value Ref Range  ? Alcohol, Ethyl (B) <10 <10 mg/dL  ?  Comment: (NOTE) ?Lowest detectable limit for serum alcohol is 10 mg/dL. ? ?For medical purposes only. ?Performed at [facility], [address] ?[place], [place] [address] ?  ?Protime-INR     Status: None  ? Collection Time: 07/24/21  4:42 PM  ?Result Value Ref Range  ? Prothrombin Time 12.9 11.4 - 15.2 seconds  ? INR 1.0 0.8 - 1.2  ?  Comment: (NOTE) ?INR goal varies based on device and disease states. ?Performed at [facility], [address] ?[place], [place] [address] ?  ?APTT     Status: None  ?  Collection Time: 07/24/21  4:42 PM  ?Result Value Ref Range  ? aPTT 26 24 - 36 seconds  ?  Comment: Performed at [facility], [place] [address] [place], [place] [address]  ?CBC     Status: None  ? Collection Time: 07/24/21  4:42 PM  ?Result Value Ref Range  ? WBC 8.0 4.0 - 10.5 K/uL  ? RBC 3.92 3.87 - 5.11 MIL/uL  ? Hemoglobin 12.5 12.0 - 15.0 g/dL  ? HCT 36.2 36.0 - 46.0 %  ? MCV 92.3 80.0 - 100.0 fL  ? MCH 31.9 26.0 - 34.0 pg  ? MCHC 34.5 30.0 - 36.0 g/dL  ? RDW 13.3 11.5 - 15.5 %  ? Platelets 330 150 - 400 K/uL  ? nRBC 0.0 0.0 - 0.2 %  ?  Comment: Performed at [facility], [place] [address] [place], [place] [address]  ?Differential     Status: Abnormal  ? Collection Time: 07/24/21  4:42 PM  ?Result Value Ref Range  ? Neutrophils Relative % 89 %  ?  Neutro Abs 7.1 1.7 - 7.7 K/uL  ? Lymphocytes Relative 8 %  ? Lymphs Abs 0.6 (L) 0.7 - 4.0 K/uL  ? Monocytes Relative 3 %  ? Monocytes Absolute 0.2 0.1 - 1.0 K/uL  ? Eosinophils Relative 0 %  ? Eosinophils Absolute 0.0 0.0 - 0.5 K/uL  ? Basophils Relative 0 %  ? Basophils Absolute 0.0 0.0 - 0.1 K/uL  ? Immature Granulocytes 0 %  ? Abs Immature Granulocytes 0.03 0.00 - 0.07 K/uL  ?  Comment: Performed at [facility], [place] [address] [place], [place] [address]  ?Comprehensive metabolic panel     Status: Abnormal  ? Collection Time: 07/24/21  4:42 PM  ?Result Value Ref Range  ? Sodium 132 (L) 135 - 145 mmol/L  ? Potassium 4.4 3.5 - 5.1 mmol/L  ? Chloride 100 98 - 111 mmol/L  ? CO2 24 22 - 32 mmol/L  ? Glucose, Bld 108 (H) 70 - 99 mg/dL  ?  Comment: Glucose reference range applies only to samples taken after fasting for at least 8 hours.  ? BUN 14 8 - 23 mg/dL  ? Creatinine, Ser 0.60 0.44 - 1.00 mg/dL  ? Calcium 9.2 8.9 - 10.3 mg/dL  ? Total Protein 7.3 6.5 - 8.1 g/dL  ? Albumin 4.2 3.5 - 5.0 g/dL  ? AST 31 15 - 41 U/L  ? ALT 14 0 - 44 U/L  ? Alkaline Phosphatase 70 38 - 126 U/L  ? Total Bilirubin 0.6 0.3 - 1.2 mg/dL  ?  GFR, Estimated >60 >60 mL/min  ?  Comment: (NOTE) ?Calculated using the CKD-EPI Creatinine Equation (2021) ?  ? Anion gap 8 5 - 15  ?  Comment: Performed at [facility], [address]

## 2021-07-24 NOTE — ED Provider Notes (Signed)
?[facility] DEPT ?Provider Note ? ? ?CSN: [id number] ?Arrival date & time: 07/24/21  1413 ? ?  ? ?History ? ?Chief Complaint  ?Patient presents with  ? Dizziness  ? Loss of Consciousness  ? ? ?Sara Carson is a 76 y.o. female. ? ?HPI ?Patient reports she typically gets up at 5:30 in the morning to feed her cat.  She got up this morning as usual and got into the kitchen.  She reports that the last thing she remembered.  She reports he then awakened on the floor at about 915.  She reports when she came around she was so dizzy she had a hard time even thinking about standing up.  She managed to crawl to a chair and rest for a little while.  She subsequently called her daughter.  She reports that she continues to be extremely dizzy with spinning quality.  She reports that so severe that she cannot stand or walk she reports she is intensely nauseated and any movement of her head or position change makes symptoms worse.  He denies any changes in her vision.  She denies she ever had double vision or loss of vision.  She denies she ever felt there was one extremity that was weak number and coordinated.  Did not have any problems with her speech or thought patterns.  She denies she has ever had vertigo or syncope previously.  She reports she has been doing well and has not had any symptoms leading up to this.  She denies any prodromal symptoms.  Denies she has a headache in association with this although later after initial evaluation, she did comment on pressure on her forehead and the feeling that maybe it was a sinus problem. ?  ? ?Home Medications ?Prior to Admission medications   ?Medication Sig Start Date End Date Taking? Authorizing Provider  ?atenolol (TENORMIN) 25 MG tablet Take 0.5 tablets (12.5 mg total) by mouth in the morning AND 0.5 tablets (12.5 mg total) every evening. 05/29/21   Troy Sine, MD  ?calcium citrate (CALCITRATE - DOSED IN MG ELEMENTAL CALCIUM) 950 MG tablet Take  200 mg of elemental calcium by mouth daily.    [provider]  ?cholecalciferol (VITAMIN D3) 25 MCG (1000 UNIT) tablet Take 1,000 Units by mouth daily.    [provider]  ?citalopram (CELEXA) 10 MG tablet Take 10 mg by mouth daily. 10/01/19   [provider]  ?ferrous gluconate (FERGON) 240 (27 FE) MG tablet Take 27 mg by mouth daily. 09/13/20   [provider]  ?levothyroxine (SYNTHROID) 75 MCG tablet Take 75 mcg by mouth daily. 05/21/21   [provider]  ?Multiple Vitamin (MULTIVITAMIN) tablet Take 1 tablet by mouth daily.    [provider]  ?selenium 200 MCG TABS tablet Take 200 mcg by mouth daily. 10/12/20   [provider]  ?vitamin C (ASCORBIC ACID) 500 MG tablet Take 500 mg by mouth daily.    [provider]  ?gabapentin (NEURONTIN) 300 MG capsule Day 1: 300 mg, Day 2: 300 mg twice daily, Day 3: 300 mg 3 times daily 01/29/14 10/21/19  Leandrew Koyanagi, MD  ?   ? ?Allergies    ?Methimazole, Amoxicillin, and Levaquin [levofloxacin in d5w]   ? ?Review of Systems   ?Review of Systems ?10 systems reviewed negative except as per HPI ?Physical Exam ?Updated Vital Signs ?BP 120/61   Pulse 69   Temp 98 ?F (36.7 ?C) (Oral)  Resp 20   Ht '5\' 2"'$  (1.575 m)   Wt 51.7 kg   SpO2 98%   BMI 20.85 kg/m?  ?Physical Exam ?Constitutional:   ?   Comments: Patient is alert.  She has clear mental status.  Good condition for age.  She is lying supine in the stretcher feels that if she raises her head returns she is extremely symptomatic.  ?HENT:  ?   Head: Normocephalic and atraumatic.  ?   Ears:  ?   Comments: Patient has some cerumen in both ears but not completely impacted and portion of visualized drum does not appear erythematous ?   Mouth/Throat:  ?   Mouth: Mucous membranes are moist.  ?   Pharynx: Oropharynx is clear.  ?Eyes:  ?   Extraocular Movements: Extraocular movements intact.  ?   Conjunctiva/sclera: Conjunctivae normal.  ?   Pupils: Pupils  are equal, round, and reactive to light.  ?   Comments: Lateral nystagmus bilaterally  ?Cardiovascular:  ?   Rate and Rhythm: Normal rate and regular rhythm.  ?Pulmonary:  ?   Effort: Pulmonary effort is normal.  ?   Breath sounds: Normal breath sounds.  ?Abdominal:  ?   General: There is no distension.  ?   Palpations: Abdomen is soft.  ?   Tenderness: There is no abdominal tenderness. There is no guarding.  ?Musculoskeletal:     ?   General: No swelling or tenderness. Normal range of motion.  ?   Cervical back: Neck supple.  ?   Right lower leg: No edema.  ?   Left lower leg: No edema.  ?   Comments: Excellent range of motion.  Patient can deeply flex both hips and push against resistance with good strength.  ?Skin: ?   General: Skin is warm and dry.  ?Neurological:  ?   Comments: Cognitive function normal.  Speech clear.  No aphasia.  Finger-nose exam intact bilaterally.  Motor strength 5\5 upper and lower extremities.  Gait not tested due to extreme vertigo.  ?Psychiatric:     ?   Mood and Affect: Mood normal.  ? ? ?ED Results / Procedures / Treatments   ?Labs ?(all labs ordered are listed, but only abnormal results are displayed) ?Labs Reviewed  ?DIFFERENTIAL - Abnormal; Notable for the following components:  ?    Result Value  ? Lymphs Abs 0.6 (*)   ? All other components within normal limits  ?COMPREHENSIVE METABOLIC PANEL - Abnormal; Notable for the following components:  ? Sodium 132 (*)   ? Glucose, Bld 108 (*)   ? All other components within normal limits  ?I-STAT CHEM 8, ED - Abnormal; Notable for the following components:  ? Sodium 133 (*)   ? Creatinine, Ser 0.40 (*)   ? Glucose, Bld 106 (*)   ? All other components within normal limits  ?RESP PANEL BY RT-PCR (FLU A&B, COVID) ARPGX2  ?ETHANOL  ?PROTIME-INR  ?APTT  ?CBC  ?RAPID URINE DRUG SCREEN, HOSP PERFORMED  ?URINALYSIS, ROUTINE W REFLEX MICROSCOPIC  ?TROPONIN I (HIGH SENSITIVITY)  ?TROPONIN I (HIGH SENSITIVITY)  ? ? ?EKG ?EKG  Interpretation ? ?Date/Time:  Wednesday July 24 2021 17:03:38 EDT ?Ventricular Rate:  74 ?PR Interval:  94 ?QRS Duration: 96 ?QT Interval:  406 ?QTC Calculation: 451 ?R Axis:   79 ?Text Interpretation: Sinus rhythm Ventricular premature complex Short PR interval Baseline wander in lead(s) V6 no ischemic appearance. no old comparison Confirmed by Charlesetta Shanks [phone]) on 07/24/2021 9:03:52  PM ? ?Radiology ?CT HEAD WO CONTRAST ? ?Result Date: 07/24/2021 ?CLINICAL DATA:  Provided history: Neuro deficit, acute, stroke suspected. Additional history provided: Dizziness. EXAM: CT HEAD WITHOUT CONTRAST TECHNIQUE: Contiguous axial images were obtained from the base of the skull through the vertex without intravenous contrast. RADIATION DOSE REDUCTION: This exam was performed according to the departmental dose-optimization program which includes automated exposure control, adjustment of the mA and/or kV according to patient size and/or use of iterative reconstruction technique. COMPARISON:  Prior head CT 01/28/2012. FINDINGS: Brain: Mild generalized cerebral atrophy. Mild-to-moderate patchy and ill-defined hypoattenuation within the cerebral white matter, nonspecific but compatible with chronic small vessel disease. Prominent perivascular space within the inferior right basal ganglia. There is no acute intracranial hemorrhage. No demarcated cortical infarct. No extra-axial fluid collection. No evidence of an intracranial mass. No midline shift. Vascular: No hyperdense vessel.  Atherosclerotic calcifications. Skull: Normal. Negative for fracture or focal lesion. Sinuses/Orbits: Visualized orbits show no acute finding. No significant paranasal sinus disease at the imaged levels. IMPRESSION: No evidence of acute intracranial abnormality. Mild-to-moderate chronic small vessel ischemic changes within the cerebral white matter, progressed from the prior head CT of 01/28/2012 Mild generalized cerebral atrophy. Electronically Signed    By: Kellie Simmering D.O.   On: 07/24/2021 17:36  ? ?MR ANGIO HEAD WO CONTRAST ? ?Result Date: 07/24/2021 ?CLINICAL DATA:  Acute neurologic deficit EXAM: MRI HEAD WITHOUT CONTRAST MRA HEAD WITHOUT CONTRAST MRA NECK WITHOUT AND WITH CO

## 2021-07-25 ENCOUNTER — Encounter (HOSPITAL_COMMUNITY): Payer: Self-pay | Admitting: Internal Medicine

## 2021-07-25 ENCOUNTER — Observation Stay (HOSPITAL_BASED_OUTPATIENT_CLINIC_OR_DEPARTMENT_OTHER): Payer: Medicare Other

## 2021-07-25 ENCOUNTER — Observation Stay (HOSPITAL_COMMUNITY): Payer: Medicare Other

## 2021-07-25 DIAGNOSIS — R42 Dizziness and giddiness: Secondary | ICD-10-CM | POA: Diagnosis not present

## 2021-07-25 DIAGNOSIS — D649 Anemia, unspecified: Secondary | ICD-10-CM

## 2021-07-25 DIAGNOSIS — R55 Syncope and collapse: Secondary | ICD-10-CM | POA: Diagnosis not present

## 2021-07-25 DIAGNOSIS — E039 Hypothyroidism, unspecified: Secondary | ICD-10-CM | POA: Diagnosis not present

## 2021-07-25 DIAGNOSIS — E876 Hypokalemia: Secondary | ICD-10-CM | POA: Diagnosis not present

## 2021-07-25 DIAGNOSIS — I10 Essential (primary) hypertension: Secondary | ICD-10-CM | POA: Diagnosis present

## 2021-07-25 LAB — COMPREHENSIVE METABOLIC PANEL
ALT: 12 U/L (ref 0–44)
AST: 29 U/L (ref 15–41)
Albumin: 3.7 g/dL (ref 3.5–5.0)
Alkaline Phosphatase: 61 U/L (ref 38–126)
Anion gap: 10 (ref 5–15)
BUN: 13 mg/dL (ref 8–23)
CO2: 22 mmol/L (ref 22–32)
Calcium: 8.8 mg/dL — ABNORMAL LOW (ref 8.9–10.3)
Chloride: 100 mmol/L (ref 98–111)
Creatinine, Ser: 0.52 mg/dL (ref 0.44–1.00)
GFR, Estimated: >60 mL/min (ref >60)
Glucose, Bld: 86 mg/dL (ref 70–99)
Potassium: 3.2 mmol/L — ABNORMAL LOW (ref 3.5–5.1)
Sodium: 132 mmol/L — ABNORMAL LOW (ref 135–145)
Total Bilirubin: 0.6 mg/dL (ref 0.3–1.2)
Total Protein: 6.5 g/dL (ref 6.5–8.1)

## 2021-07-25 LAB — TROPONIN I (HIGH SENSITIVITY): Troponin I (High Sensitivity): 3 ng/L (ref <18)

## 2021-07-25 LAB — ECHOCARDIOGRAM COMPLETE
AR max vel: 2.52 cm2
AV Peak grad: 7.4 mmHg
Ao pk vel: 1.36 m/s
Area-P 1/2: 3.46 cm2
Calc EF: 61.5 %
Height: 62 in
S' Lateral: 3.1 cm
Single Plane A2C EF: 59.7 %
Single Plane A4C EF: 64.5 %
Weight: 1824 oz

## 2021-07-25 LAB — CBC WITH DIFFERENTIAL/PLATELET
Abs Immature Granulocytes: 0.02 10*3/uL (ref 0.00–0.07)
Basophils Absolute: 0 10*3/uL (ref 0.0–0.1)
Basophils Relative: 0 %
Eosinophils Absolute: 0.1 10*3/uL (ref 0.0–0.5)
Eosinophils Relative: 1 %
HCT: 33.4 % — ABNORMAL LOW (ref 36.0–46.0)
Hemoglobin: 11.6 g/dL — ABNORMAL LOW (ref 12.0–15.0)
Immature Granulocytes: 0 %
Lymphocytes Relative: 24 %
Lymphs Abs: 1.5 10*3/uL (ref 0.7–4.0)
MCH: 32.6 pg (ref 26.0–34.0)
MCHC: 34.7 g/dL (ref 30.0–36.0)
MCV: 93.8 fL (ref 80.0–100.0)
Monocytes Absolute: 0.5 10*3/uL (ref 0.1–1.0)
Monocytes Relative: 9 %
Neutro Abs: 4.1 10*3/uL (ref 1.7–7.7)
Neutrophils Relative %: 66 %
Platelets: 306 10*3/uL (ref 150–400)
RBC: 3.56 MIL/uL — ABNORMAL LOW (ref 3.87–5.11)
RDW: 13.3 % (ref 11.5–15.5)
WBC: 6.2 10*3/uL (ref 4.0–10.5)
nRBC: 0 % (ref 0.0–0.2)

## 2021-07-25 LAB — MAGNESIUM: Magnesium: 2.2 mg/dL (ref 1.7–2.4)

## 2021-07-25 LAB — TSH: TSH: 0.713 u[IU]/mL (ref 0.350–4.500)

## 2021-07-25 IMAGING — DX DG CHEST 1V PORT
2 series · 2 of 2 positions shown · non-contrast
Comparison: [DATE]

CLINICAL DATA: Syncope.

EXAM:
PORTABLE CHEST 1 VIEW

[chest ap (1 of 2)]
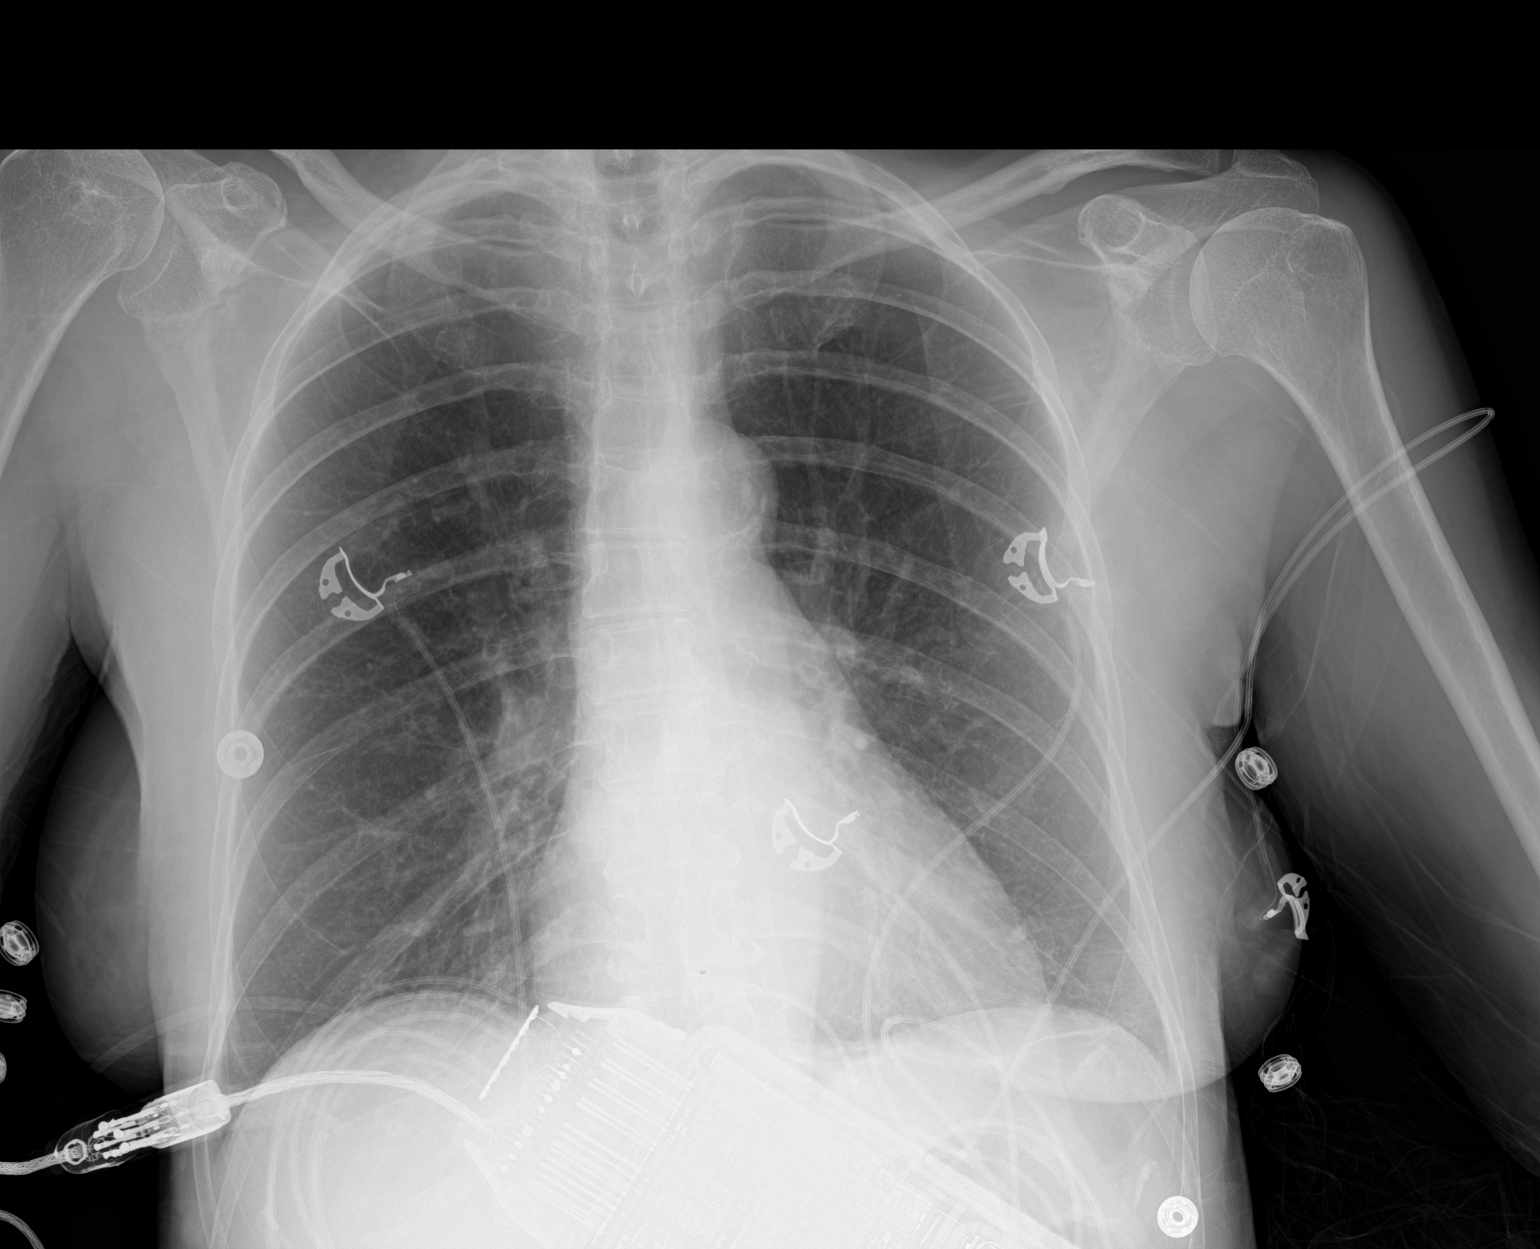

[chest ap (2 of 2)]
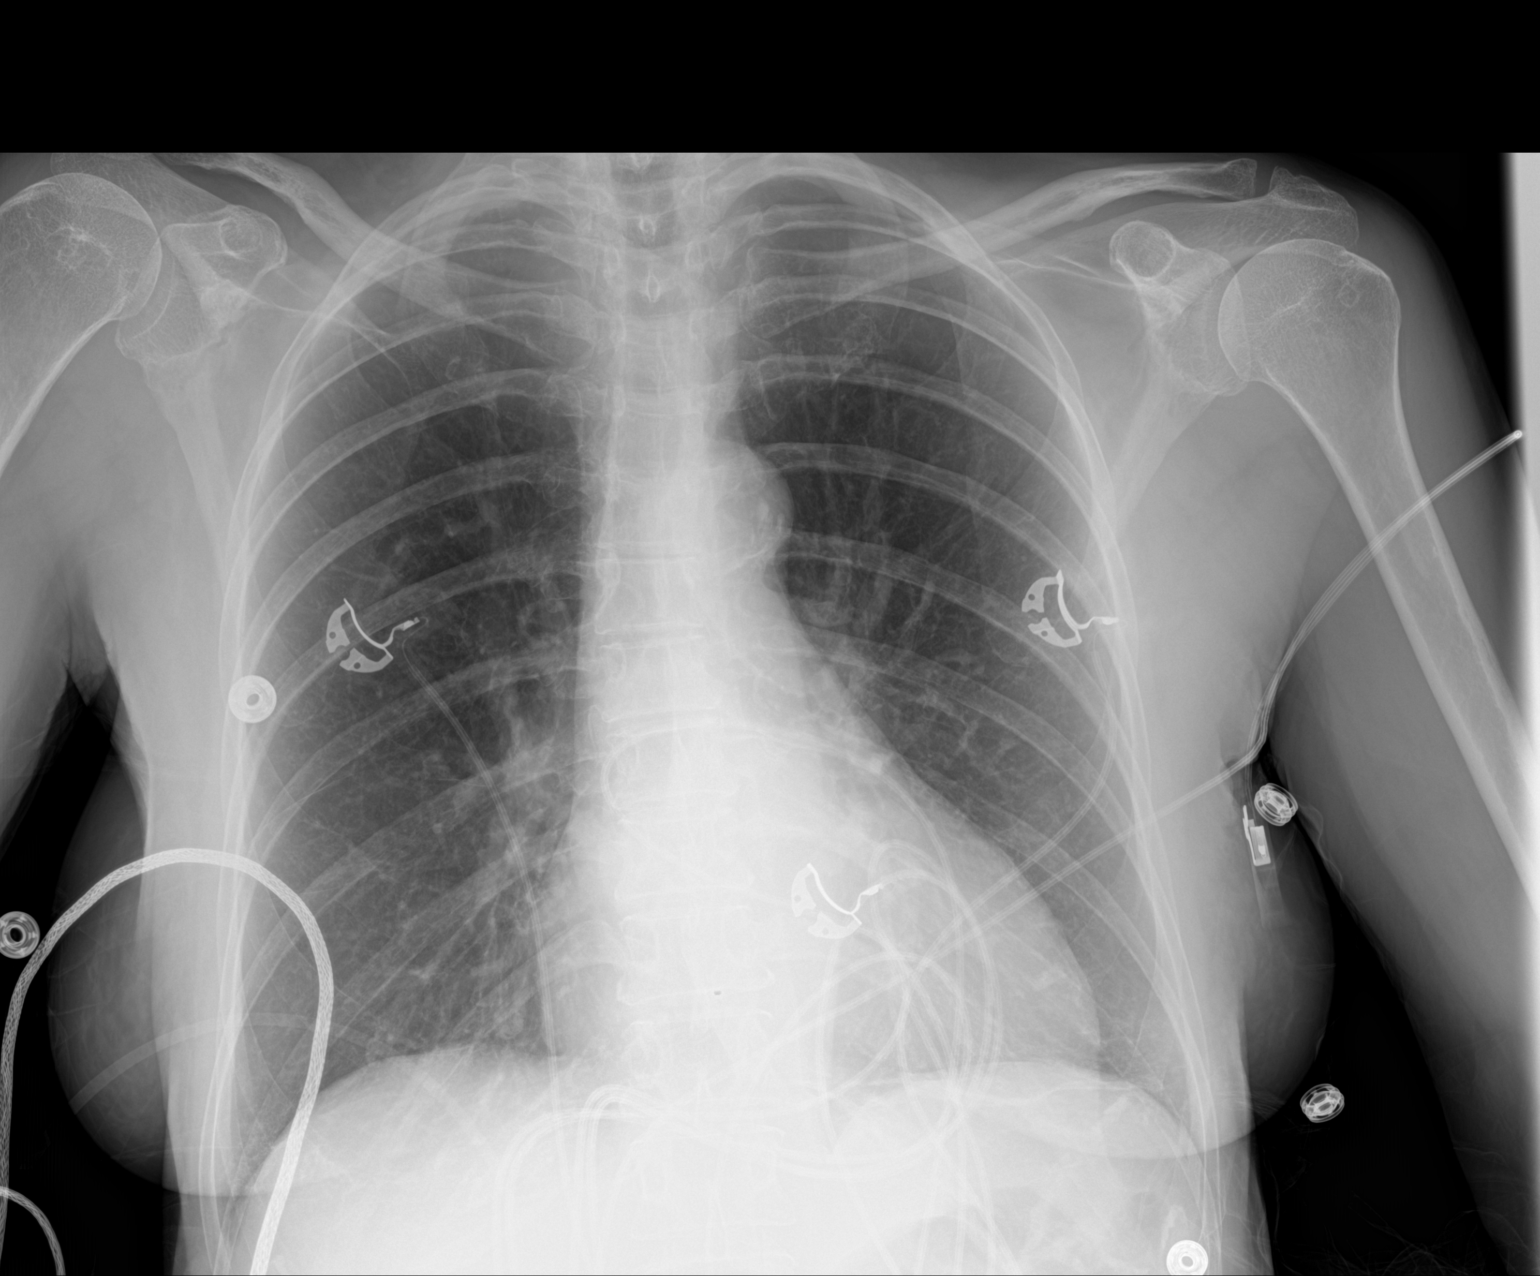

[2 of 2 positions shown; findings below may reference images not displayed]

FINDINGS: The lungs are clear without focal pneumonia, edema, pneumothorax or
pleural effusion. Cardiopericardial silhouette is at upper limits of
normal for size. The visualized bony structures of the thorax are
unremarkable. Telemetry leads overlie the chest.
IMPRESSION: No active disease.

## 2021-07-25 MED ORDER — LEVOTHYROXINE SODIUM 25 MCG PO TABS
37.5000 ug | ORAL_TABLET | ORAL | Status: DC
  Administered 2021-07-26: 75 ug via ORAL
  Filled 2021-07-25: qty 3

## 2021-07-25 MED ORDER — ACETAMINOPHEN 325 MG PO TABS: 650.0000 mg | ORAL_TABLET | Freq: Four times a day (QID) | ORAL | Status: DC | PRN

## 2021-07-25 MED ORDER — PROMETHAZINE HCL 25 MG PO TABS: 12.5000 mg | ORAL_TABLET | Freq: Four times a day (QID) | ORAL | Status: DC | PRN

## 2021-07-25 MED ORDER — MECLIZINE HCL 25 MG PO TABS
25.0000 mg | ORAL_TABLET | Freq: Three times a day (TID) | ORAL | Status: DC
Start: 2021-07-25 — End: 2021-07-26
  Administered 2021-07-25 – 2021-07-26 (×3): 25 mg via ORAL
  Filled 2021-07-25 (×3): qty 1

## 2021-07-25 MED ORDER — POTASSIUM CHLORIDE CRYS ER 20 MEQ PO TBCR
40.0000 meq | EXTENDED_RELEASE_TABLET | Freq: Once | ORAL | Status: AC
  Administered 2021-07-25: 40 meq via ORAL
  Filled 2021-07-25: qty 2

## 2021-07-25 MED ORDER — MECLIZINE HCL 25 MG PO TABS: 25.0000 mg | ORAL_TABLET | Freq: Three times a day (TID) | ORAL | Status: DC

## 2021-07-25 MED ORDER — SODIUM CHLORIDE 0.9 % IV SOLN
12.5000 mg | Freq: Four times a day (QID) | INTRAVENOUS | Status: DC | PRN
  Administered 2021-07-25: 12.5 mg via INTRAVENOUS
  Filled 2021-07-25 (×2): qty 0.5

## 2021-07-25 MED ORDER — CYCLOSPORINE 0.05 % OP EMUL
1.0000 [drp] | Freq: Two times a day (BID) | OPHTHALMIC | Status: DC
  Administered 2021-07-25 – 2021-07-26 (×2): 1 [drp] via OPHTHALMIC
  Filled 2021-07-25 (×3): qty 30

## 2021-07-25 MED ORDER — ACETAMINOPHEN 650 MG RE SUPP: 650.0000 mg | Freq: Four times a day (QID) | RECTAL | Status: DC | PRN

## 2021-07-25 MED ORDER — LEVOTHYROXINE SODIUM 25 MCG PO TABS: 37.5000 ug | ORAL_TABLET | ORAL | Status: DC

## 2021-07-25 MED ORDER — ATENOLOL 25 MG PO TABS
12.5000 mg | ORAL_TABLET | Freq: Every day | ORAL | Status: DC
  Administered 2021-07-25 – 2021-07-26 (×2): 12.5 mg via ORAL
  Filled 2021-07-25 (×2): qty 1

## 2021-07-25 NOTE — Assessment & Plan Note (Addendum)
Not sure that she actually has hypertension. ?As stated above, atenolol was started for palpitations related to hyperthyroidism. ?Atenolol resumed. ?

## 2021-07-25 NOTE — Assessment & Plan Note (Addendum)
Suspect BPPV ?Extensive imaging including CT head, MRI brain, MRA head and neck without stroke or acute abnormalities. ?Yesterday despite 2 sessions of PT vestibular rehab, remain quite symptomatic of vertigo and nausea and was unsafe to discharge home. ?Changed meclizine from as needed to 25 Mg 3 times daily scheduled.  Patient declined Zofran for nausea, placed on p.o./IV as needed Phenergan. ?She has clinically improved.  Feels much better.  Eagerly awaiting PT follow-up today and is hoping to go home. ?Advised her that she may take the meclizine, 1 tab 3 times a day scheduled for a couple of days and then try to take it only as needed depending on her symptom improvement.  She verbalized understanding. ?TOC team to arrange outpatient PT vestibular rehab. ?Could consider outpatient ENT consultation. ?

## 2021-07-25 NOTE — Progress Notes (Signed)
?PROGRESS NOTE ?  ?Sara Carson  RSW:[id number]    DOB: 11-21-1945    DOA: 07/24/2021 ? ?PCP: Lajean Manes, MD  ? ?I have briefly reviewed patients previous medical records in [facility]. ? ?Chief Complaint  ?Patient presents with  ? Dizziness  ? Loss of Consciousness  ? ? ?Hospital Course:  ?76 year old female, lives alone and independent, medical history significant for hyperthyroidism/Graves' disease, s/p radioactive iodine treatment 11/14/2020, post ablation hypothyroidism, presented to ED with sudden onset of vertigo, dizziness, unwitnessed syncope and fall, reportedly laid on the floor for couple of hours, and reportedly hit her head.  Extensive imaging including CT head, MRI head, MRA head and neck negative for acute findings.  Admitted for acute vertigo due to suspected BPPV.  Remains severely symptomatic despite vestibular maneuvers by PT. ? ?Assessment and Plan: ?* Vertigo ?Suspect BPPV ?Extensive imaging including CT head, MRI brain, MRA head and neck without stroke or acute abnormalities. ?As per PT, remained severely symptomatic even after 2 sessions of vestibular treatment this morning and this afternoon. ?Changed meclizine from as needed to 25 Mg 3 times daily scheduled.  Patient declined Zofran for nausea, placed on p.o./IV as needed Phenergan. ?Continue to work with PT. ? ?Syncope ?Suspected due to severe symptomatic vertigo. ?Confirmed with patient that her vertigo actually started prior to syncope. ?Unable to get orthostatics thus far due to severe vertigo. ?Telemetry personally reviewed: Sinus rhythm. ?High-sensitivity troponins x2: Negative. ?TTE: LVEF 09-38%, grade 1 diastolic dysfunction.  No aortic stenosis present. ?CT head, MRI head, MRA head and neck: No acute findings. ?Patient counseled that she should not drive x6 months and she verbalized understanding.  Sister at bedside during counseling. ? ?Hypokalemia ?Replaced and follow.  Magnesium normal. ? ?Acquired  hypothyroidism ?Reportedly sudden onset of hyperthyroidism/Graves' disease after a booster shot of COVID 19, intolerant of methimazole, seen at [facility] and underwent radioiodine ablation. ?TSH 0.713. ?Patient on atenolol for palpitations that she had during her hyperthyroidism phase and followed with Dr. Claiborne Billings, cardiology. ?Continue home dose of atenolol and levothyroxine. ? ?Normocytic anemia ?Appears stable to her numbers in June 2022. ?Outpatient follow-up. ? ?Essential hypertension ?Not sure that she actually has hypertension. ?As stated above, atenolol was started for palpitations related to hyperthyroidism. ?Atenolol resumed. ? ? ?Body mass index is 20.85 kg/m?. ? ? ?DVT prophylaxis: SCDs Start: 07/24/21 2120   ?  Code Status: Full Code:  ?Family Communication: Discussed in detail with patient's sister at bedside. ?Disposition:  ?Status is: Observation ?The patient remains OBS appropriate and will d/c before 2 midnights. ?  ? ?Consultants:   ?None ? ?Procedures:   ?None ? ?Antimicrobials:   ?None ? ? ?Subjective:  ?Seen this morning.  Sister at bedside.  Reports that she feels somewhat better this morning, able to turn her head and neck from side to side without much dizziness/vertigo.  No nausea or vomiting since hospital admission.  Had not been out of bed yet.  Since then, PT updated after seeing her in the morning that she was still quite symptomatic and was going to work with her again in the afternoon.  RN has now reported that she remains symptomatic of nausea even after working with PT this afternoon ? ?Objective:  ? ?Vitals:  ? 07/24/21 2242 07/25/21 0246 07/25/21 0650 07/25/21 1247  ?BP: 126/71 136/78 136/71 124/68  ?Pulse: 63 69 65 76  ?Resp: '18 18 18   '$ ?Temp: 98 ?F (36.7 ?C) 97.6 ?F (36.4 ?C) 98 ?F (36.7 ?  C) 98 ?F (36.7 ?C)  ?TempSrc: Oral Oral Oral Oral  ?SpO2: 97% 98% 98% 98%  ?Weight: 51.7 kg     ?Height: '5\' 2"'$  (1.575 m)     ? ? ?General exam: Elderly female, moderately built and thinly  nourished lying comfortably supine in bed without distress. ?Respiratory system: Clear to auscultation. Respiratory effort normal. ?Cardiovascular system: S1 & S2 heard, RRR. No JVD, murmurs, rubs, gallops or clicks. No pedal edema.  Telemetry personally reviewed: Sinus rhythm. ?Gastrointestinal system: Abdomen is nondistended, soft and nontender. No organomegaly or masses felt. Normal bowel sounds heard. ?Central nervous system: Alert and oriented. No focal neurological deficits.  No nystagmus noted. ?Extremities: Symmetric 5 x 5 power. ?Skin: No rashes, lesions or ulcers ?Psychiatry: Judgement and insight appear normal. Mood & affect appropriate.  ? ?Data Reviewed:   ?I have personally reviewed following labs and imaging studies ? ?CBC: ?Recent Labs  ?Lab 07/24/21 ?1642 07/24/21 ?1726 07/25/21 ?0500  ?WBC 8.0  --  6.2  ?NEUTROABS 7.1  --  4.1  ?HGB 12.5 12.9 11.6*  ?HCT 36.2 38.0 33.4*  ?MCV 92.3  --  93.8  ?PLT 330  --  306  ? ?Basic Metabolic Panel: ?Recent Labs  ?Lab 07/24/21 ?1642 07/24/21 ?1726 07/24/21 ?2255 07/25/21 ?0500  ?NA 132* 133*  --  132*  ?K 4.4 4.2  --  3.2*  ?CL 100 98  --  100  ?CO2 24  --   --  22  ?GLUCOSE 108* 106*  --  86  ?BUN 14 13  --  13  ?CREATININE 0.60 0.40*  --  0.52  ?CALCIUM 9.2  --   --  8.8*  ?MG  --   --  1.9 2.2  ? ?Liver Function Tests: ?Recent Labs  ?Lab 07/24/21 ?1642 07/25/21 ?0500  ?AST 31 29  ?ALT 14 12  ?ALKPHOS 70 61  ?BILITOT 0.6 0.6  ?PROT 7.3 6.5  ?ALBUMIN 4.2 3.7  ? ?CBG: ?No results for input(s): GLUCAP in the last 168 hours. ?Microbiology Studies:  ? ?Recent Results (from the past 240 hour(s))  ?Resp Panel by RT-PCR (Flu A&B, Covid) Nasopharyngeal Swab     Status: None  ? Collection Time: 07/24/21  4:42 PM  ? Specimen: Nasopharyngeal Swab; Nasopharyngeal(NP) swabs in vial transport medium  ?Result Value Ref Range Status  ? SARS Coronavirus 2 by RT PCR NEGATIVE NEGATIVE Final  ?  Comment: (NOTE) ?SARS-CoV-2 target nucleic acids are NOT DETECTED. ? ?The SARS-CoV-2  RNA is generally detectable in upper respiratory ?specimens during the acute phase of infection. The lowest ?concentration of SARS-CoV-2 viral copies this assay can detect is ?138 copies/mL. A negative result does not preclude SARS-Cov-2 ?infection and should not be used as the sole basis for treatment or ?other patient management decisions. A negative result may occur with  ?improper specimen collection/handling, submission of specimen other ?than nasopharyngeal swab, presence of viral mutation(s) within the ?areas targeted by this assay, and inadequate number of viral ?copies(<138 copies/mL). A negative result must be combined with ?clinical observations, patient history, and epidemiological ?information. The expected result is Negative. ? ?Fact Sheet for Patients:  ?EntrepreneurPulse.com.au ? ?Fact Sheet for Healthcare Providers:  ?IncredibleEmployment.be ? ?This test is no t yet approved or cleared by the [place] FDA and  ?has been authorized for detection and/or diagnosis of SARS-CoV-2 by ?FDA under an Emergency Use Authorization (EUA). This EUA will remain  ?in effect (meaning this test can be used) for the duration of the ?COVID-19  declaration under Section 564(b)(1) of the Act, 21 ?U.S.C.section 360bbb-3(b)(1), unless the authorization is terminated  ?or revoked sooner.  ? ? ?  ? Influenza A by PCR NEGATIVE NEGATIVE Final  ? Influenza B by PCR NEGATIVE NEGATIVE Final  ?  Comment: (NOTE) ?The Xpert Xpress SARS-CoV-2/FLU/RSV plus assay is intended as an aid ?in the diagnosis of influenza from Nasopharyngeal swab specimens and ?should not be used as a sole basis for treatment. Nasal washings and ?aspirates are unacceptable for Xpert Xpress SARS-CoV-2/FLU/RSV ?testing. ? ?Fact Sheet for Patients: ?EntrepreneurPulse.com.au ? ?Fact Sheet for Healthcare Providers: ?IncredibleEmployment.be ? ?This test is not yet approved or cleared by the  [place] FDA and ?has been authorized for detection and/or diagnosis of SARS-CoV-2 by ?FDA under an Emergency Use Authorization (EUA). This EUA will remain ?in effect (meaning this test can be used) for the duration of the

## 2021-07-25 NOTE — Evaluation (Signed)
Physical Therapy Evaluation ?Patient Details ?Name: Sara Carson ?MRN: [id number] ?DOB: 05/31/1945 ?Today's Date: 07/25/2021 ? ?History of Present Illness ? Patient is a 76 y.o. female with PMH significant for essential hypertension, acquired hypothyroidism, who is admitted to [facility] on 07/24/2021 with refractory vertigo after presenting from home to [facility] ED complaining of vertigo. Patient had a syncopal episode at home and fell and hit her head. when she woke up she had severe dizziness and was unable to stand or walk. CT and MRI negative for any acute injuries to neck or brain. ? ?  ?Clinical Impression ? Sara Carson is 76 y.o. female admitted with above HPI and diagnosis. Patient is currently limited by functional impairments below (see PT problem list). Patient lives alone and is independent at baseline. Vestibular evaluation completed with following findings: oculomotor exam normal with Rt beating nystagmus noted in Rt gaze only. Positional exam revealed positive Lt Dix-Hallpike and attempt to treat with Eply maneuver. Pt demonstrated severe balance reactions/responses with all four extremities extending to find support after moving from sidelying to sit EOB. MAX assist needed to prevent LOB and stabilize patient. Pt was able to transfer to [facility] with 2+ min assist and back to bed. Initiated second attempt at [facility] however pt had extreme nausea and overheated, discussed deferring and attempting treatment again later today. Patient will benefit from continued skilled PT interventions to address impairments and progress independence with mobility, recommending OPPT for vestibular rehab once pt is mobilizing at safe level to return home. Acute PT will follow and progress as able.    ?   ? ?Recommendations for follow up therapy are one component of a multi-disciplinary discharge planning process, led by the attending physician.  Recommendations may be updated based on patient status, additional functional  criteria and insurance authorization. ? ?Follow Up Recommendations Outpatient PT (vestibular rehab) ? ?  ?Assistance Recommended at Discharge Frequent or constant Supervision/Assistance  ?Patient can return home with the following ? A lot of help with walking and/or transfers;A lot of help with bathing/dressing/bathroom;Assistance with cooking/housework;Assist for transportation;Help with stairs or ramp for entrance;Direct supervision/assist for financial management;Direct supervision/assist for medications management ? ?  ?Equipment Recommendations Other (comment) (TBA further)  ?Recommendations for Other Services ?    ?  ?Functional Status Assessment Patient has had a recent decline in their functional status and demonstrates the ability to make significant improvements in function in a reasonable and predictable amount of time.  ? ?  ?Precautions / Restrictions Precautions ?Precautions: Fall ?Restrictions ?Weight Bearing Restrictions: No  ? ?  ? ? Vestibular Assessment - 07/25/21 0001   ? ?  ? Vestibular Assessment  ? General Observation resting in supine, keeping head still.   ?  ? Symptom Behavior  ? Subjective history of current problem Pt reports she got up at 5:30 to feed her cat. While getting the food she got dizzy and nauseous and then passed out and hit the floor. Pt reports when she became alert again she had severe dizziness and was unable to stand so she crawled to her recliner and climbed into it and rested until about 9:30. Her friend came over to check on her and called 911 because she could not get up from the recliner.   ? Type of Dizziness  Spinning;"World moves";Vertigo;Lightheadedness   ? Frequency of Dizziness constant but flucttuating with severe symptoms after supien>sit and rolling.   ? Duration of Dizziness constant and worse with moving   ? Symptom  Nature Motion provoked;Positional;Variable;Constant   ? Aggravating Factors Activity in general;Turning head sideways;Turning head  quickly;Rolling to right;Rolling to left   ? Relieving Factors Lying supine;Closing eyes;Head stationary;Medication;Rest   ? Progression of Symptoms Better   better than yesterday but still severe  ? History of similar episodes no prior episodes   ?  ? Oculomotor Exam  ? Oculomotor Alignment Normal   ? Ocular ROM WNL   ? Spontaneous Absent   ? Gaze-induced  Right beating nystagmus with R gaze   ? Smooth Pursuits Intact   ? Saccades Intact   ?  ? Positional Testing  ? Dix-Hallpike Dix-Hallpike Right;Dix-Hallpike Left   ?  ? Dix-Hallpike Right  ? Dix-Hallpike Right Duration pt continues to be dizzy but no torsional nystagmus   ? Dix-Hallpike Right Symptoms No nystagmus   ?  ? Dix-Hallpike Left  ? Dix-Hallpike Left Duration ~45 seconds for nystagmus   ? Dix-Hallpike Left Symptoms Upbeat, right rotatory nystagmus   ?  ? Cognition  ? Cognition Orientation Level Oriented x 4   ? ?  ?  ? ?  ? ? ?Mobility ? Bed Mobility ?Overal bed mobility: Needs Assistance ?Bed Mobility: Rolling, Sit to Supine, Sidelying to Sit ?Rolling: Min assist, Min guard ?Sidelying to sit: Max assist ?  ?Sit to supine: Mod assist ?  ?General bed mobility comments: Pt demonstrated good ability to roll and initiate bed mobility with reaching UE to rail and moving LE's. limited by dizziness/nausea throughout. MAX assist required with sidelying to sit due to severe dizziness secondary to BPPV. Mod assist to bring LE's onot bed from sitting and move into long ist position and then slowly lower back to bed. ?  ? ?Transfers ?Overall transfer level: Needs assistance ?Equipment used: 2 person hand held assist ?Transfers: Sit to/from Stand, Bed to chair/wheelchair/BSC ?Sit to Stand: Min assist, +2 physical assistance, +2 safety/equipment ?  ?Step pivot transfers: Min assist, +2 physical assistance, +2 safety/equipment ?  ?  ?  ?General transfer comment: Min Assist +2 with HHA for pt to stabilize self. assist to take small steps laterally to move from bed<>BSC  and back to bed. ?  ? ?Ambulation/Gait ?  ?  ?  ?  ?  ?  ?  ?General Gait Details: deferred ? ?Stairs ?  ?  ?  ?  ?  ? ?Wheelchair Mobility ?  ? ?Modified Rankin (Stroke Patients Only) ?  ? ?  ? ?Balance Overall balance assessment: Needs assistance ?Sitting-balance support: Feet supported ?Sitting balance-Leahy Scale: Fair ?Sitting balance - Comments: guarding due to pt's hx of falls and vertigo ?  ?Standing balance support: Bilateral upper extremity supported ?Standing balance-Leahy Scale: Poor ?Standing balance comment: 2HHA for stability ?  ?  ?  ?  ?  ?  ?  ?  ?  ?  ?  ?   ? ? ? ?Pertinent Vitals/Pain Pain Assessment ?Pain Assessment: Faces ?Faces Pain Scale: Hurts a little bit ?Pain Location: headache ?Pain Descriptors / Indicators: Headache ?Pain Intervention(s): Limited activity within patient's tolerance, Monitored during session, Repositioned  ? ? ?Home Living Family/patient expects to be discharged to:: Private residence ?Living Arrangements: Alone ?Available Help at Discharge: Family ?Type of Home: House ?Home Access: Stairs to enter;Ramped entrance ?  ?Entrance Stairs-Number of Steps: 5 ?  ?Home Layout: One level ?  ?   ?  ?Prior Function Prior Level of Function : Independent/Modified Independent ?  ?  ?  ?  ?  ?  ?  Mobility Comments: pt exercises with a personal trainer 2x/week and is very active. ?  ?  ? ? ?Hand Dominance  ? Dominant Hand: Right ? ?  ?Extremity/Trunk Assessment  ? Upper Extremity Assessment ?Upper Extremity Assessment: Overall WFL for tasks assessed ?  ? ?Lower Extremity Assessment ?Lower Extremity Assessment: Overall WFL for tasks assessed ?  ? ?Cervical / Trunk Assessment ?Cervical / Trunk Assessment: Normal  ?Communication  ? Communication: No difficulties  ?Cognition Arousal/Alertness: Awake/alert ?Behavior During Therapy: [facility] for tasks assessed/performed ?Overall Cognitive Status: Within Functional Limits for tasks assessed ?  ?  ?  ?  ?  ?  ?  ?  ?  ?  ?  ?  ?  ?  ?  ?  ?  ?  ?   ? ?  ?General Comments   ? ?  ?Exercises    ? ?Assessment/Plan  ?  ?PT Assessment Patient needs continued PT services  ?PT Problem List Decreased activity tolerance;Decreased balance;Decreased mobility;Impaired

## 2021-07-25 NOTE — Assessment & Plan Note (Addendum)
Suspected due to severe symptomatic vertigo. ?Confirmed with patient that her vertigo actually started prior to syncope. ?Unable to get orthostatics early on due to severe vertigo.  Orthostatic vital signs today are negative. ?Telemetry personally reviewed: Sinus rhythm. ?High-sensitivity troponins x2: Negative. ?TTE: LVEF 02-77%, grade 1 diastolic dysfunction.  No aortic stenosis present. ?CT head, MRI head, MRA head and neck: No acute findings. ?Patient counseled that she should not drive x6 months and she verbalized understanding.  Sister at bedside during counseling both yesterday and today. ?

## 2021-07-25 NOTE — Hospital Course (Addendum)
76 year old female, lives alone and independent, medical history significant for hyperthyroidism/Graves' disease, s/p radioactive iodine treatment 11/14/2020, post ablation hypothyroidism, presented to ED with sudden onset of vertigo, dizziness, unwitnessed syncope and fall, reportedly laid on the floor for couple of hours, and reportedly hit her head.  Extensive imaging including CT head, MRI head, MRA head and neck negative for acute findings.  Admitted for acute vertigo due to suspected BPPV and syncope.  Clinically improved. ?

## 2021-07-25 NOTE — Assessment & Plan Note (Addendum)
Reportedly sudden onset of hyperthyroidism/Graves' disease after a booster shot of COVID 19, intolerant of methimazole, seen at [facility] and underwent radioiodine ablation. ?TSH 0.713. ?Patient on atenolol for palpitations that she had during her hyperthyroidism phase and followed with Dr. Claiborne Billings, Cardiology. ?Continue home dose of atenolol and levothyroxine. ?

## 2021-07-25 NOTE — Progress Notes (Addendum)
Physical Therapy Treatment ?Patient Details ?Name: Sara Carson ?MRN: [id number] ?DOB: 08/30/1945 ?Today's Date: 07/25/2021 ? ? ?History of Present Illness Patient is a 76 y.o. female with PMH significant for essential hypertension, acquired hypothyroidism, who is admitted to [facility] on 07/24/2021 with refractory vertigo after presenting from home to [facility] ED complaining of vertigo. Patient had a syncopal episode at home and fell and hit her head. when she woke up she had severe dizziness and was unable to stand or walk. CT and MRI negative for any acute injuries to neck or brain. ? ?  ?PT Comments  ? ? Pt seen for follow up session to attempt treating Lt sided BPPV for posterior canal. Completed Eply maneuver 1x and pt continued to experience severe dizziness/nausea with final movement to sit up EOB. Pt's nystagmus lasted >60 seconds during Epley maneuver and Semont maneuver completed as well with pt reporting better tolerance to this and slightly reduced dizziness but continued to have severe nausea. Pt requested BSC and Min +2 assist provided to transfer. Pt is not safe to discharge home at this time due to ongoing severe dizziness/nausea. Patient will benefit from continued skilled PT interventions to address impairments and progress independence with mobility, recommending OPPT for vestibular rehab once pt is mobilizing at safe level to return home. Acute PT will follow and progress as able.  ? ?  ?Recommendations for follow up therapy are one component of a multi-disciplinary discharge planning process, led by the attending physician.  Recommendations may be updated based on patient status, additional functional criteria and insurance authorization. ? ?Follow Up Recommendations ? Outpatient PT (vestibular rehab) ?  ?  ?Assistance Recommended at Discharge Frequent or constant Supervision/Assistance  ?Patient can return home with the following A lot of help with walking and/or transfers;A lot of help with  bathing/dressing/bathroom;Assistance with cooking/housework;Assist for transportation;Help with stairs or ramp for entrance;Direct supervision/assist for financial management;Direct supervision/assist for medications management ?  ?Equipment Recommendations ? Other (comment) (TBA further)  ?  ?Recommendations for Other Services   ? ? ?  ?Precautions / Restrictions Precautions ?Precautions: Fall ?Restrictions ?Weight Bearing Restrictions: No  ?  ? ? Vestibular Assessment - 07/25/21 1630   ? ?  ? Vestibular Assessment  ? General Observation resting in supine, keeping head still.   ?  ? Symptom Behavior  ? Type of Dizziness  Spinning;"World moves";Vertigo;Lightheadedness   ? Frequency of Dizziness constant but flucttuating with severe symptoms after supien>sit and rolling.   ? Duration of Dizziness constant and worse with moving   ? Symptom Nature Motion provoked;Positional;Variable;Constant   ? Aggravating Factors Activity in general;Turning head sideways;Turning head quickly;Rolling to right;Rolling to left   ? Relieving Factors Lying supine;Closing eyes;Head stationary;Medication;Rest   ? Progression of Symptoms Better   better than yesterday but still severe  ? History of similar episodes no prior episodes   ?  ? Positional Testing  ? Dix-Hallpike Dix-Hallpike Right;Dix-Hallpike Left   ? Horizontal Canal Testing Horizontal Canal Right;Horizontal Canal Left   ?  ? Dix-Hallpike Right  ? Dix-Hallpike Right Duration pt continues to be dizzy but no torsional nystagmus   ? Dix-Hallpike Right Symptoms No nystagmus   ?  ? Dix-Hallpike Left  ? Dix-Hallpike Left Duration most severe beating was over before 60 seconds however beating continued close to 80 seconds on second position of Eply's after testing and finding positive nystagmus again.   ? Dix-Hallpike Left Symptoms Upbeat, right rotatory nystagmus   ?  ? Horizontal  Canal Right  ? Horizontal Canal Right Duration Slight Rt beat upward   ? Horizontal Canal Right Symptoms  Nystagmus   ?  ? Horizontal Canal Left  ? Horizontal Canal Left Symptoms Normal   ?  ? Cognition  ? Cognition Orientation Level Oriented x 4   ? ?  ?  ? ?  ? ? ?Mobility ? Bed Mobility ?Overal bed mobility: Needs Assistance ?Bed Mobility: Rolling, Sidelying to Sit, Sit to Sidelying, Sit to Supine ?Rolling: Min assist ?Sidelying to sit: Max assist, +2 for safety/equipment ?  ?Sit to supine: Mod assist, +2 for safety/equipment ?Sit to sidelying: Max assist, +2 for safety/equipment ?General bed mobility comments: pt able to assist with rolling. 2+ assist provided for sidelying/supine<>sit due to strong righting reactions pt has at EOB due to severe vertigo. Max+2 to move through sidelying positions and Mod +2 to return to supine at EOS via sit EOB>long sit>supine. ?  ? ?Transfers ?Overall transfer level: Needs assistance ?Equipment used: 2 person hand held assist ?Transfers: Sit to/from Stand, Bed to chair/wheelchair/BSC ?Sit to Stand: Min assist, +2 safety/equipment ?  ?Step pivot transfers: Min assist, +2 safety/equipment ?  ?  ?  ?General transfer comment: Min Assist to rise and steady balance, no postural lean noted in standing. pt required +2 to pivot to [facility] and back to bed. ?  ? ?Ambulation/Gait ?  ?  ?  ?  ?  ?  ?  ?General Gait Details: deferred ? ? ?Stairs ?  ?  ?  ?  ?  ? ? ?Wheelchair Mobility ?  ? ?Modified Rankin (Stroke Patients Only) ?  ? ? ?  ?Balance Overall balance assessment: Needs assistance ?Sitting-balance support: Feet supported ?Sitting balance-Leahy Scale: Fair ?Sitting balance - Comments: guarding due to pt's hx of falls and vertigo ?  ?Standing balance support: Bilateral upper extremity supported ?Standing balance-Leahy Scale: Poor ?Standing balance comment: 2HHA for stability ?  ?  ?  ?  ?  ?  ?  ?  ?  ?  ?  ?  ? ?  ?Cognition Arousal/Alertness: Awake/alert ?Behavior During Therapy: [facility] for tasks assessed/performed ?Overall Cognitive Status: Within Functional Limits for tasks assessed ?  ?   ?  ?  ?  ?  ?  ?  ?  ?  ?  ?  ?  ?  ?  ?  ?  ?  ?  ? ?  ?Exercises   ? ?  ?General Comments   ?  ?  ? ?Pertinent Vitals/Pain Pain Assessment ?Pain Assessment: Faces ?Faces Pain Scale: Hurts a little bit ?Pain Location: headache ?Pain Descriptors / Indicators: Headache ?Pain Intervention(s): Limited activity within patient's tolerance, Monitored during session, Repositioned  ? ? ?Home Living Family/patient expects to be discharged to:: Private residence ?Living Arrangements: Alone ?Available Help at Discharge: Family ?Type of Home: House ?Home Access: Stairs to enter;Ramped entrance ?  ?Entrance Stairs-Number of Steps: 5 ?  ?Home Layout: One level ?  ?   ?  ?Prior Function    ?  ?  ?   ? ?PT Goals (current goals can now be found in the care plan section) Acute Rehab PT Goals ?Patient Stated Goal: stop being dizzy ?PT Goal Formulation: With patient ?Time For Goal Achievement: 08/01/21 ?Potential to Achieve Goals: Good ?Progress towards PT goals: Progressing toward goals ? ?  ?Frequency ? ? ? Min 3X/week ? ? ? ?  ?PT Plan    ? ? ?Co-evaluation   ?  ?  ?  ?  ? ?  ?  AM-PAC PT "6 Clicks" Mobility   ?Outcome Measure ? Help needed turning from your back to your side while in a flat bed without using bedrails?: A Little ?Help needed moving from lying on your back to sitting on the side of a flat bed without using bedrails?: A Lot ?Help needed moving to and from a bed to a chair (including a wheelchair)?: A Lot ?Help needed standing up from a chair using your arms (e.g., wheelchair or bedside chair)?: Total ?Help needed to walk in hospital room?: Total ?Help needed climbing 3-5 steps with a railing? : Total ?6 Click Score: 10 ? ?  ?End of Session Equipment Utilized During Treatment: Gait belt ?Activity Tolerance: Patient tolerated treatment well ?Patient left: in bed;with call bell/phone within reach;with bed alarm set;with family/visitor present ?Nurse Communication: Mobility status (2 person for transfer to [facility]) ?PT Visit  Diagnosis: Unsteadiness on feet (R26.81);Other abnormalities of gait and mobility (R26.89);History of falling (Z91.81);BPPV;Dizziness and giddiness (R42) ?BPPV - Right/Left : Left ?  ? ? ?Time: 1422-15

## 2021-07-25 NOTE — Assessment & Plan Note (Signed)
Appears stable to her numbers in June 2022. ?Outpatient follow-up. ?

## 2021-07-25 NOTE — Assessment & Plan Note (Addendum)
Replaced.  Magnesium normal. ?

## 2021-07-25 NOTE — H&P (Signed)
?History and Physical  ? ? ?PLEASE NOTE THAT DRAGON DICTATION SOFTWARE WAS USED IN THE CONSTRUCTION OF THIS NOTE. ? ? ?Sara Carson ZOX:[id number] DOB: Oct 04, 1945 DOA: 07/24/2021 ? ?PCP: Lajean Manes, MD  ?Patient coming from: home  ? ?I have personally briefly reviewed patient's old medical records in [place] ? ?Chief Complaint: Vertigo ? ?HPI: Sara Carson is a 76 y.o. female with medical history significant for essential hypertension, acquired hypothyroidism, who is admitted to [facility] on 07/24/2021 with refractory vertigo after presenting from home to [facility] ED complaining of vertigo.  ? ?The patient reports that on the morning of 07/24/2021 that she awoke at her typical wake-up time of 5:30 AM, and was completely asymptomatic at that time.  She ambulated into the kitchen to feed her cat, which is her morning routine.  She has subsequently lost consciousness, falling to the floor and unwitnessed event.  The patient reports that she woke 2 hours later, with new onset vertigo, which she qualified by describing a sensation of room spinning.  She notes that she was not experiencing any vertigo prior to this episode of loss of consciousness.  Being up to the episode of loss of consciousness, she denies any preceding dizziness, lightheadedness, or subjective sensation of impending loss of consciousness.  She has no recollection of falling to the floor, believing that she lost consciousness before the fall.  She does believe that she hit her head as a component of this fall.  Denies any associated or ensuing acute focal weakness, acute focal numbness, paresthesias, acute change in vision, slurred speech, facial droop, aphasia, dysphagia.  Not on any blood thinners as an outpatient.  She also denies any recent or ensuing chest pain, shortness of breath, palpitations, diaphoresis.  No recent subjective fever, chills or generalized myalgias.  Denies any recent nausea, vomiting, diarrhea. ? ?Medical  history notable for such hypertension for which patient is on atenolol at home.  Per chart review, most recent serum sodium noted to be 133 June 2022. ? ? ? ? ?ED Course:  ?Vital signs in the ED were notable for the following: Afebrile; heart rate 65-82; blood pressure 120/61; respiratory rate 15-21, oxygen saturation 96 to 100% on room air. ? ?Labs were notable for the following: CMP notable for the following sodium 132, bicarbonate 24, 0.60, glucose 108, liver enzymes within normal limits.  CBC notable for white blood cell count 8000, hemoglobin 12.5.  INR 1.0.  Serum ethanol level less than 10.  Urinary drug screen as well as urinalysis been ordered, with results currently pending.  COVID-19/influenza PCR negative. ? ?Imaging and additional notable ED work-up: EKG shows sinus rhythm with single PVC, heart rate 74, and no evidence of T wave or ST changes, including no evidence of ST elevation.  Noncontrast CT head showed no evidence of acute intracranial process, including no evidence of intracranial hemorrhage.  MRI brain showed no evidence of acute intracranial process, including no evidence of acute infarct.  MRA of the head and neck showed no evidence of large vessel occlusion or flow-limiting stenosis nor any evidence of aneurysm/dissection.  ? ?While in the ED, the following were administered: Acetaminophen 1 g p.o. x1, Valium 2.5 mg IV x1, meclizine x2 doses.   ? ?Subsequently, the patient was admitted for overnight observation for further evaluation and management of presenting refractory vertigo in the setting of a single syncopal event, without associated prodrome. ? ? ?Review of Systems: As per HPI otherwise 10 point review  of systems negative.  ? ?Past Medical History:  ?Diagnosis Date  ? Anemia   ? Depression   ? Graves disease 10/04/2020  ? Shingles   ? ? ?Past Surgical History:  ?Procedure Laterality Date  ? ABDOMINAL HYSTERECTOMY    ? TONSILLECTOMY    ? ? ?Social History: ? reports that she has  never smoked. She has never used smokeless tobacco. She reports that she does not currently use alcohol after a past usage of about 3.0 standard drinks per week. She reports that she does not use drugs. ? ? ?Allergies  ?Allergen Reactions  ? Levothyroxine Other (See Comments)  ?  HAD A CARDIAC REACTION @ 88 mcg  ? Methimazole Itching, Nausea Only and Other (See Comments)  ?  Elevated liver enzymes and extreme nausea ?  ? Codeine Other (See Comments)  ?  Vertigo ?  ? Levaquin [Levofloxacin In D5w] Other (See Comments)  ?  "Crazy feeling"  ? Levofloxacin Other (See Comments)  ?  "Crazy feeling"  ? Amoxicillin Hives and Rash  ? ? ?Family History  ?Problem Relation Age of Onset  ? Cancer Mother   ? Diabetes Father   ? Hyperlipidemia Father   ? Hypertension Father   ? Thyroid disease Neg Hx   ? ? ?Family history reviewed and not pertinent  ? ? ?Prior to Admission medications   ?Medication Sig Start Date End Date Taking? Authorizing Provider  ?atenolol (TENORMIN) 25 MG tablet Take 0.5 tablets (12.5 mg total) by mouth in the morning AND 0.5 tablets (12.5 mg total) every evening. ?Patient taking differently: Take 12.5 mg by mouth every morning 05/29/21  Yes Troy Sine, MD  ?cycloSPORINE (RESTASIS) 0.05 % ophthalmic emulsion Place 1 drop into both eyes 2 (two) times daily.   Yes [provider]  ?levothyroxine (SYNTHROID) 75 MCG tablet Take 37.5-75 mcg by mouth See admin instructions. Take 75 mcg by mouth in the morning before breakfast SIX days a week and 37.5 mcg ONE day a week 05/21/21  Yes [provider]  ?calcium citrate (CALCITRATE - DOSED IN MG ELEMENTAL CALCIUM) 950 MG tablet Take 200 mg of elemental calcium by mouth daily. ?Patient not taking: Reported on 07/24/2021    [provider]  ?cholecalciferol (VITAMIN D3) 25 MCG (1000 UNIT) tablet Take 1,000 Units by mouth daily. ?Patient not taking: Reported on 07/24/2021    [provider]  ?ferrous gluconate (FERGON) 240 (27 FE) MG  tablet Take 27 mg by mouth daily. ?Patient not taking: Reported on 07/24/2021 09/13/20   [provider]  ?Multiple Vitamin (MULTIVITAMIN) tablet Take 1 tablet by mouth daily. ?Patient not taking: Reported on 07/24/2021    [provider]  ?selenium 200 MCG TABS tablet Take 200 mcg by mouth daily. ?Patient not taking: Reported on 07/24/2021 10/12/20   [provider]  ?vitamin C (ASCORBIC ACID) 500 MG tablet Take 500 mg by mouth daily. ?Patient not taking: Reported on 07/24/2021    [provider]  ?gabapentin (NEURONTIN) 300 MG capsule Day 1: 300 mg, Day 2: 300 mg twice daily, Day 3: 300 mg 3 times daily 01/29/14 10/21/19  Leandrew Koyanagi, MD  ? ? ? ?Objective  ? ? ?Physical Exam: ?Vitals:  ? 07/24/21 2015 07/24/21 2130 07/24/21 2242 07/25/21 0246  ?BP: 120/61 (!) 124/96 126/71 136/78  ?Pulse: 69 80 63 69  ?Resp: '20 20 18 18  '$ ?Temp:   98 ?F (36.7 ?C) 97.6 ?F (36.4 ?C)  ?TempSrc:   Oral  Oral  ?SpO2: 98% 98% 97% 98%  ?Weight:   51.7 kg   ?Height:   '5\' 2"'$  (1.575 m)   ? ? ?General: appears to be stated age; alert, oriented ?Skin: warm, dry, no rash ?Head:  AT/[place] ?Mouth:  Oral mucosa membranes appear moist, normal dentition ?Neck: supple; trachea midline ?Heart:  RRR; did not appreciate any M/R/G ?Lungs: CTAB, did not appreciate any wheezes, rales, or rhonchi ?Abdomen: + BS; soft, ND, NT ?Vascular: 2+ pedal pulses b/l; 2+ radial pulses b/l ?Extremities: no peripheral edema, no muscle wasting ?Neuro: strength and sensation intact in upper and lower extremities b/l ? ? ?Labs on Admission: I have personally reviewed following labs and imaging studies ? ?CBC: ?Recent Labs  ?Lab 07/24/21 ?1642 07/24/21 ?1726 07/25/21 ?0500  ?WBC 8.0  --  6.2  ?NEUTROABS 7.1  --  4.1  ?HGB 12.5 12.9 11.6*  ?HCT 36.2 38.0 33.4*  ?MCV 92.3  --  93.8  ?PLT 330  --  306  ? ?Basic Metabolic Panel: ?Recent Labs  ?Lab 07/24/21 ?1642 07/24/21 ?1726 07/24/21 ?2255  ?NA 132* 133*  --   ?K 4.4 4.2  --   ?CL 100 98  --   ?CO2  24  --   --   ?GLUCOSE 108* 106*  --   ?BUN 14 13  --   ?CREATININE 0.60 0.40*  --   ?CALCIUM 9.2  --   --   ?MG  --   --  1.9  ? ?GFR: ?Estimated Creatinine Clearance: 48.1 mL/min (A) (by C-G formula based on S

## 2021-07-26 ENCOUNTER — Other Ambulatory Visit (HOSPITAL_COMMUNITY): Payer: Self-pay | Admitting: *Deleted

## 2021-07-26 DIAGNOSIS — R42 Dizziness and giddiness: Secondary | ICD-10-CM | POA: Diagnosis not present

## 2021-07-26 DIAGNOSIS — R55 Syncope and collapse: Secondary | ICD-10-CM | POA: Diagnosis not present

## 2021-07-26 DIAGNOSIS — E876 Hypokalemia: Secondary | ICD-10-CM | POA: Diagnosis not present

## 2021-07-26 DIAGNOSIS — E039 Hypothyroidism, unspecified: Secondary | ICD-10-CM | POA: Diagnosis not present

## 2021-07-26 DIAGNOSIS — D649 Anemia, unspecified: Secondary | ICD-10-CM | POA: Diagnosis not present

## 2021-07-26 LAB — BASIC METABOLIC PANEL
Anion gap: 6 (ref 5–15)
BUN: 17 mg/dL (ref 8–23)
CO2: 26 mmol/L (ref 22–32)
Calcium: 9.1 mg/dL (ref 8.9–10.3)
Chloride: 103 mmol/L (ref 98–111)
Creatinine, Ser: 0.53 mg/dL (ref 0.44–1.00)
GFR, Estimated: >60 mL/min (ref >60)
Glucose, Bld: 96 mg/dL (ref 70–99)
Potassium: 3.9 mmol/L (ref 3.5–5.1)
Sodium: 135 mmol/L (ref 135–145)

## 2021-07-26 MED ORDER — ATENOLOL 25 MG PO TABS
ORAL_TABLET | ORAL | Status: DC
Start: 2021-07-26 — End: 2021-08-21

## 2021-07-26 MED ORDER — MECLIZINE HCL 25 MG PO TABS
25.0000 mg | ORAL_TABLET | Freq: Three times a day (TID) | ORAL | 0 refills | Status: DC | PRN
Start: 2021-07-26 — End: 2021-08-21

## 2021-07-26 NOTE — Discharge Instructions (Signed)

## 2021-07-26 NOTE — TOC Transition Note (Signed)
Transition of Care (TOC) - CM/SW Discharge Note ? ? ?Patient Details  ?Name: Sara Carson ?MRN: [id number] ?Date of Birth: [birth year] ? ?Transition of Care (TOC) CM/SW Contact:  ?Ji Feldner, Marjie Skiff, RN ?Phone Number: ?07/26/2021, 3:35 PM ? ? ?Clinical Narrative:    ?Pt to dc home with sister today. Outpatient vestibular therapy referral sent to [facility]. Information placed on AVS. Sister made aware. ? ? ?  ?

## 2021-07-26 NOTE — Progress Notes (Addendum)
Physical Therapy Treatment ?Patient Details ?Name: LAQUANTA HUMMEL ?MRN: [id number] ?DOB: 1945/08/22 ?Today's Date: 07/26/2021 ? ? ?History of Present Illness Patient is a 76 y.o. female with PMH significant for essential hypertension, acquired hypothyroidism, who is admitted to [facility] on 07/24/2021 with refractory vertigo after presenting from home to [facility] ED complaining of vertigo. Patient had a syncopal episode at home and fell and hit her head. when she woke up she had severe dizziness and was unable to stand or walk. CT and MRI negative for any acute injuries to neck or brain. ? ?  ?PT Comments  ? ? Pt making gradual progress.  In regards to mobility, pt was able to ambulate and transfer with min guard, using RW, and only mild unsteadiness - reports significant improvement.  Sister will be staying with pt at d/c and can provide supervision.  In regards to vestibular testing, pt with persistant BPPV.  Reports symptoms some better today.  Symptoms still consistent with L posterior canal Cupulolithiasis as major source but likely also some R posterior canal involvement.  Pt would continue to benefit from PT to address BPPV management.  She is mobilizing better and has home support if discharging today and continue BPPV treatment with outpt neuro.   ?   ?Orthostatic BP were stable.  ?  ?Recommendations for follow up therapy are one component of a multi-disciplinary discharge planning process, led by the attending physician.  Recommendations may be updated based on patient status, additional functional criteria and insurance authorization. ? ?Follow Up Recommendations ? Outpatient PT (vestibular rehab (prefers [facility] - closest to home)) ?  ?  ?Assistance Recommended at Discharge Frequent or constant Supervision/Assistance  ?Patient can return home with the following Assist for transportation;Help with stairs or ramp for entrance;A little help with walking and/or transfers;A little help with  bathing/dressing/bathroom;Assistance with cooking/housework ?  ?Equipment Recommendations ? None recommended by PT (Has RW and w/c)  ?  ?Recommendations for Other Services   ? ? ?  ?Precautions / Restrictions Precautions ?Precautions: Fall  ?  ? ?Mobility ? Bed Mobility ?Overal bed mobility: Needs Assistance ?Bed Mobility: Rolling, Sidelying to Sit, Sit to Sidelying ?Rolling: Supervision ?Sidelying to sit: Min guard ?  ?  ?Sit to sidelying: Min guard ?General bed mobility comments: min guard for safety ?  ? ?Transfers ?Overall transfer level: Needs assistance ?Equipment used: Rolling walker (2 wheels) ?Transfers: Sit to/from Stand ?Sit to Stand: Min guard ?  ?  ?  ?  ?  ?  ?  ? ?Ambulation/Gait ?Ambulation/Gait assistance: Min guard ?Gait Distance (Feet): 80 Feet (80' then 10'x4) ?Assistive device: Rolling walker (2 wheels) ?Gait Pattern/deviations: Step-through pattern, Decreased stride length ?Gait velocity: decreased ?  ?  ?General Gait Details: Cautious/slow speed but no overt LOB; cued for segmental turns to control dizziness; reports feels "off" but not spinning today; ambulated in hallway and then to bathroom and back twice during session; recommended RW at home (pt has one) ? ? ?Stairs ?  ?  ?  ?  ?  ? ? ?Wheelchair Mobility ?  ? ?Modified Rankin (Stroke Patients Only) ?  ? ? ?  ?Balance Overall balance assessment: Needs assistance ?Sitting-balance support: Feet supported, No upper extremity supported, Bilateral upper extremity supported ?Sitting balance-Leahy Scale: Fair ?Sitting balance - Comments: Could sit without UE support except after vertigo maneuvars needed min guard and UE support ?  ?Standing balance support: Bilateral upper extremity supported, No upper extremity supported ?Standing balance-Leahy Scale:  Fair ?Standing balance comment: RW to ambulate but did toileitng ADLs without UE support ?  ?  ?  ?  ?  ?  ?  ?  ?  ?  ?  ?  ? ?  ?Cognition Arousal/Alertness: Awake/alert ?Behavior During  Therapy: [facility] for tasks assessed/performed ?Overall Cognitive Status: Within Functional Limits for tasks assessed ?  ?  ?  ?  ?  ?  ?  ?  ?  ?  ?  ?  ?  ?  ?  ?  ?General Comments: Very pleasant, eager to learn about BPPV ?  ?  ? ?  ?Exercises   ? ?  ?General Comments  VESTIBULAR TREATMENT: ? ?Started with mobility then progressed to Vestibular.  ? ?Epley maneuvar for L posterior canal: In initial position upward R rotational nystagmus that persisted for 2 mins then slowed but switched to vertical nystagmus.  Took 4 mins to resolve before changing to next position.  In each position, rotational nystagmus/symptoms returned taking 1-2 mins to resolve.  Stayed in each position at least 2 mins. Transferred slowly back to sitting.  Pt tolerated better today (still dizzy, nauseated, but not flailing extremities to gain control).  Had emesis bag ready and pt smelling alcohol prep pad to assist with nausea and had cold rag on head.  ? ?After pt recovered educated and performed habituation exercises.  Discussed symptoms consistent with posterior canal cupulolithiasis but may have some right sided involvement.  Recommended use of RW at home, supervision with mobility, and utilizing w/c ramp to enter house today.  Discussed no driving at this time.  Recommended f/u with outpt PT - provided list of vestibular clinics but informed free to go where she wants.  States someone called earlier from [place] at [facility] [employer] but pt reports would rather be closer home like the Neuro rehab clinic, [place], or [facility] - sent case Freight forwarder message. Also, educated therapy would consist of BPPV movements, habituation exercises, balance and mobility.  Provided HEP of habituation exercises in seated position as well as Brandt-Dardoff maneuvar.  ? ?Then treated pt with Semont Manuevar for L side: significant rotational nystagmus but resolving much quicker and tolerated better than Epley.  Once recovered treated R Semont - again rotation  nystagmus, similar reaction.   ?  ?  ? ?Pertinent Vitals/Pain Pain Assessment ?Pain Assessment: No/denies pain  ? ? ?Home Living   ?  ?  ?  ?  ?  ?  ?  ?  ?  ?   ?  ?Prior Function    ?  ?  ?   ? ?PT Goals (current goals can now be found in the care plan section) Progress towards PT goals: Progressing toward goals ? ?  ?Frequency ? ? ? Min 3X/week ? ? ? ?  ?PT Plan Current plan remains appropriate  ? ? ?Co-evaluation   ?  ?  ?  ?  ? ?  ?AM-PAC PT "6 Clicks" Mobility   ?Outcome Measure ? Help needed turning from your back to your side while in a flat bed without using bedrails?: A Little ?Help needed moving from lying on your back to sitting on the side of a flat bed without using bedrails?: A Little ?Help needed moving to and from a bed to a chair (including a wheelchair)?: A Little ?Help needed standing up from a chair using your arms (e.g., wheelchair or bedside chair)?: A Little ?Help needed to walk in hospital room?: A Little ?  Help needed climbing 3-5 steps with a railing? : A Little ?6 Click Score: 18 ? ?  ?End of Session Equipment Utilized During Treatment: Gait belt ?Activity Tolerance: Patient tolerated treatment well ?Patient left: in bed;with call bell/phone within reach;with bed alarm set;with family/visitor present ?Nurse Communication: Mobility status ?PT Visit Diagnosis: Unsteadiness on feet (R26.81);Other abnormalities of gait and mobility (R26.89);History of falling (Z91.81);BPPV;Dizziness and giddiness (R42) ?BPPV - Right/Left : Left ?  ? ? ?Time: 9735-3299 ?PT Time Calculation (min) (ACUTE ONLY): 90 min (Out 12 mins for HEP) ? ?Charges:  $Gait Training: 8-22 mins ?$Neuromuscular Re-education: 8-22 mins ?$Canalith Rep Proc: 38-52 mins          ?          ? ?Abran Richard, PT ?Acute Rehab Services ?Pager [phone] ?[facility] [phone] ? ? ? ?Mikael Spray Kamilla Hands ?07/26/2021, 6:00 PM ? ?

## 2021-07-26 NOTE — Discharge Summary (Signed)
Physician Discharge Summary  ?JONNELL HENTGES HQP:[id number] DOB: 18-Dec-1945 ? ?PCP: Lajean Manes, MD ? ?Admitted from: Home ?Discharged to: Home ? ?Admit date: 07/24/2021 ?Discharge date: 07/26/2021 ? ?Recommendations for Outpatient Follow-up:  ? ? Follow-up Information   ? ? Lajean Manes, MD. Schedule an appointment as soon as possible for a visit in 1 week(s).   ?Specialty: Internal Medicine ?Why: To be seen with repeat labs (CBC & BMP).  Consider outpatient ENT consultation for evaluation and management of her vertigo. ?Contact information: ?[address] ?[place] [address] ?[phone] ? ? ?  ?  ? ? Troy Sine, MD .   ?Specialty: Cardiology ?Contact information: ?[address] ?[address] ?[place] [address] ?[phone] ? ? ?  ?  ? ?  ?  ? ?  ? ? ?Home Health: Outpatient PT for vestibular rehab. ?  ? ?Equipment/Devices: None ?  ? ?Discharge Condition: Improved and stable. ?  Code Status: Full Code ?Diet recommendation:  ?Discharge Diet Orders (From admission, onward)  ? ?  Start     Ordered  ? 07/26/21 0000  Diet - low sodium heart healthy       ? 07/26/21 1518  ? ?  ?  ? ?  ?  ? ?Discharge Diagnoses:  ?Principal Problem: ?  Vertigo ?Active Problems: ?  Syncope ?  Hypokalemia ?  Acquired hypothyroidism ?  Normocytic anemia ?  Essential hypertension ? ? ?Brief Hospital Course: ?76 year old female, lives alone and independent, medical history significant for hyperthyroidism/Graves' disease, s/p radioactive iodine treatment 11/14/2020, post ablation hypothyroidism, presented to ED with sudden onset of vertigo, dizziness, unwitnessed syncope and fall, reportedly laid on the floor for couple of hours, and reportedly hit her head.  Extensive imaging including CT head, MRI head, MRA head and neck negative for acute findings.  Admitted for acute vertigo due to suspected BPPV and syncope.  Clinically improved. ? ?Assessment and Plan: ?* Vertigo ?Suspect BPPV ?Extensive imaging including CT  head, MRI brain, MRA head and neck without stroke or acute abnormalities. ?Yesterday despite 2 sessions of PT vestibular rehab, remain quite symptomatic of vertigo and nausea and was unsafe to discharge home. ?Changed meclizine from as needed to 25 Mg 3 times daily scheduled.  Patient declined Zofran for nausea, placed on p.o./IV as needed Phenergan. ?She has clinically improved.  Feels much better.  Eagerly awaiting PT follow-up today and is hoping to go home. ?Advised her that she may take the meclizine, 1 tab 3 times a day scheduled for a couple of days and then try to take it only as needed depending on her symptom improvement.  She verbalized understanding. ?TOC team to arrange outpatient PT vestibular rehab. ?Could consider outpatient ENT consultation. ? ?Syncope ?Suspected due to severe symptomatic vertigo. ?Confirmed with patient that her vertigo actually started prior to syncope. ?Unable to get orthostatics early on due to severe vertigo.  Orthostatic vital signs today are negative. ?Telemetry personally reviewed: Sinus rhythm. ?High-sensitivity troponins x2: Negative. ?TTE: LVEF 35-45%, grade 1 diastolic dysfunction.  No aortic stenosis present. ?CT head, MRI head, MRA head and neck: No acute findings. ?Patient counseled that she should not drive x6 months and she verbalized understanding.  Sister at bedside during counseling both yesterday and today. ? ?Hypokalemia ?Replaced.  Magnesium normal. ? ?Acquired hypothyroidism ?Reportedly sudden onset of hyperthyroidism/Graves' disease after a booster shot of COVID 19, intolerant of methimazole, seen at [facility] and underwent radioiodine ablation. ?TSH 0.713. ?Patient on atenolol for  palpitations that she had during her hyperthyroidism phase and followed with Dr. Claiborne Billings, Cardiology. ?Continue home dose of atenolol and levothyroxine. ? ?Normocytic anemia ?Appears stable to her numbers in June 2022. ?Outpatient follow-up. ? ?Essential hypertension ?Not sure  that she actually has hypertension. ?As stated above, atenolol was started for palpitations related to hyperthyroidism. ?Atenolol resumed. ? ? ?Body mass index is 22.58 kg/m?. ? ? ?Consultations: ?None ? ?Procedures: ?None ? ?Discharge Instructions ?Discharge Instructions   ? ? Ambulatory referral to Physical Therapy   Complete by: As directed ?  ? Vestibular therapy  ? Call MD for:   Complete by: As directed ?  ? Recurrent or worsening vertigo/dizziness or passing out.  ? Call MD for:  extreme fatigue   Complete by: As directed ?  ? Call MD for:  persistant dizziness or light-headedness   Complete by: As directed ?  ? Call MD for:  persistant nausea and vomiting   Complete by: As directed ?  ? Diet - low sodium heart healthy   Complete by: As directed ?  ? Driving Restrictions   Complete by: As directed ?  ? No driving for 6 months.  This has been counseled to patient multiple times, including in the presence of her sister at bedside and they both verbalized understanding.  ? Increase activity slowly   Complete by: As directed ?  ? ?  ? ?  ?Medication List  ?  ? ?STOP taking these medications   ? ?calcium citrate 950 (200 Ca) MG tablet ?Commonly known as: CALCITRATE - dosed in mg elemental calcium ?  ?cholecalciferol 25 MCG (1000 UNIT) tablet ?Commonly known as: VITAMIN D3 ?  ?ferrous gluconate 240 (27 FE) MG tablet ?Commonly known as: FERGON ?  ?multivitamin tablet ?  ?selenium 200 MCG Tabs tablet ?  ?vitamin C 500 MG tablet ?Commonly known as: ASCORBIC ACID ?  ? ?  ? ?TAKE these medications   ? ?atenolol 25 MG tablet ?Commonly known as: TENORMIN ?Take 12.5 mg by mouth every morning ?  ?cycloSPORINE 0.05 % ophthalmic emulsion ?Commonly known as: RESTASIS ?Place 1 drop into both eyes 2 (two) times daily. ?  ?levothyroxine 75 MCG tablet ?Commonly known as: SYNTHROID ?Take 37.5-75 mcg by mouth See admin instructions. Take 75 mcg by mouth in the morning before breakfast SIX days a week and 37.5 mcg ONE day a week ?   ?meclizine 25 MG tablet ?Commonly known as: ANTIVERT ?Take 1 tablet (25 mg total) by mouth 3 (three) times daily as needed for dizziness or nausea. ?  ? ?  ? ?Allergies  ?Allergen Reactions  ? Levothyroxine Other (See Comments)  ?  HAD A CARDIAC REACTION @ 88 mcg  ? Methimazole Itching, Nausea Only and Other (See Comments)  ?  Elevated liver enzymes and extreme nausea ?  ? Codeine Other (See Comments)  ?  Vertigo ?  ? Levaquin [Levofloxacin In D5w] Other (See Comments)  ?  "Crazy feeling"  ? Levofloxacin Other (See Comments)  ?  "Crazy feeling"  ? Amoxicillin Hives and Rash  ? ?Procedures/Studies: ?CT HEAD WO CONTRAST ? ?Result Date: 07/24/2021 ?CLINICAL DATA:  Provided history: Neuro deficit, acute, stroke suspected. Additional history provided: Dizziness. EXAM: CT HEAD WITHOUT CONTRAST TECHNIQUE: Contiguous axial images were obtained from the base of the skull through the vertex without intravenous contrast. RADIATION DOSE REDUCTION: This exam was performed according to the departmental dose-optimization program which includes automated exposure control, adjustment of the mA and/or kV according to  patient size and/or use of iterative reconstruction technique. COMPARISON:  Prior head CT 01/28/2012. FINDINGS: Brain: Mild generalized cerebral atrophy. Mild-to-moderate patchy and ill-defined hypoattenuation within the cerebral white matter, nonspecific but compatible with chronic small vessel disease. Prominent perivascular space within the inferior right basal ganglia. There is no acute intracranial hemorrhage. No demarcated cortical infarct. No extra-axial fluid collection. No evidence of an intracranial mass. No midline shift. Vascular: No hyperdense vessel.  Atherosclerotic calcifications. Skull: Normal. Negative for fracture or focal lesion. Sinuses/Orbits: Visualized orbits show no acute finding. No significant paranasal sinus disease at the imaged levels. IMPRESSION: No evidence of acute intracranial  abnormality. Mild-to-moderate chronic small vessel ischemic changes within the cerebral white matter, progressed from the prior head CT of 01/28/2012 Mild generalized cerebral atrophy. Electronically Signed   By: Marylyn Ishihara

## 2021-07-30 DIAGNOSIS — H2513 Age-related nuclear cataract, bilateral: Secondary | ICD-10-CM | POA: Diagnosis not present

## 2021-07-30 DIAGNOSIS — H04123 Dry eye syndrome of bilateral lacrimal glands: Secondary | ICD-10-CM | POA: Diagnosis not present

## 2021-07-31 ENCOUNTER — Ambulatory Visit: Payer: Medicare Other | Attending: Internal Medicine

## 2021-07-31 DIAGNOSIS — H8113 Benign paroxysmal vertigo, bilateral: Secondary | ICD-10-CM | POA: Diagnosis not present

## 2021-07-31 DIAGNOSIS — R2681 Unsteadiness on feet: Secondary | ICD-10-CM | POA: Insufficient documentation

## 2021-07-31 DIAGNOSIS — R42 Dizziness and giddiness: Secondary | ICD-10-CM | POA: Diagnosis not present

## 2021-07-31 NOTE — Therapy (Signed)
?OUTPATIENT PHYSICAL THERAPY VESTIBULAR EVALUATION ? ? ? ? ?Patient Name: Sara Carson ?MRN: [id number] ?DOB:05-20-1945, 76 y.o., female ?Today's Date: 07/31/2021 ? ?PCP: Lajean Manes, MD ?REFERRING PROVIDER: Modena Jansky, MD ? ? PT End of Session - 07/31/21 1224   ? ? Visit Number 1   ? Number of Visits 9   ? Date for PT Re-Evaluation 08/30/21   ? Authorization Type Medicare A+B/AARP   ? Progress Note Due on Visit 10   ? PT Start Time 1230   ? PT Stop Time 1315   ? PT Time Calculation (min) 45 min   ? Activity Tolerance Other (comment)   Nausea/Dizziness  ? Behavior During Therapy [facility] for tasks assessed/performed   ? ?  ?  ? ?  ? ? ?Past Medical History:  ?Diagnosis Date  ? Anemia   ? Depression   ? Graves disease 10/04/2020  ? Shingles   ? ?Past Surgical History:  ?Procedure Laterality Date  ? ABDOMINAL HYSTERECTOMY    ? TONSILLECTOMY    ? ?Patient Active Problem List  ? Diagnosis Date Noted  ? Syncope 07/25/2021  ? Acquired hypothyroidism 07/25/2021  ? Essential hypertension 07/25/2021  ? Hypokalemia 07/25/2021  ? Normocytic anemia 07/25/2021  ? Vertigo 07/24/2021  ? Hyperthyroidism 10/19/2020  ? ? ?ONSET DATE: 07/24/2021 ? ?REFERRING DIAG: R42 (ICD-10-CM) - Vertigo ? ?THERAPY DIAG:  ?Dizziness and giddiness ? ?BPPV (benign paroxysmal positional vertigo), bilateral ? ?Unsteadiness on feet ? ?SUBJECTIVE:  ? ?SUBJECTIVE STATEMENT: ?Patient presented to [facility] ED, after unwitnessed fall due to syncopal episode with vertigo reported upon wakening. Patient reports that the dizziness is still intermittent, has gotten slightly better. Reports when she leans back into chair, lie in the bed, and in the morning when she wakes up. Reports that she is having some headaches and nausea. Patient reports feels like the vision has felt off. Reports spinning sensation that is lasting approx a minute. Reports it is longer in the mornings. Patient is currently using a RW in the mornings for safety. Is trying to phase the  meclizine out.   ? ?Pt accompanied by: family member ? ?PERTINENT HISTORY: Graves Disease, Depression, Anemia ? ?PAIN:  ?Are you having pain? Yes: NPRS scale: 3/10 ?Pain location: Head ?Pain description: Headache ?Aggravating factors: dizziness ?Relieving factors: rest, tylenol ? ?PRECAUTIONS: None ? ?WEIGHT BEARING RESTRICTIONS No ? ?FALLS: Has patient fallen in last 6 months? Yes. Number of falls 1 ? ?LIVING ENVIRONMENT: ?Lives with: lives alone ?Lives in: House/apartment ?Stairs: Yes: External: 5 steps; can reach both ?Has following equipment at home: Gilford Rile - 2 wheeled ? ?PLOF: Independent ? ?PATIENT GOALS Resolve the Dizziness ? ?OBJECTIVE:  ? ?DIAGNOSTIC FINDINGS: Extensive imaging including CT head, MRI brain, MRA head and neck without stroke or acute abnormalities ? ? ?COGNITION: ?Overall cognitive status: Within functional limits for tasks assessed ?  ?SENSATION: ?WFL ? ? ?TRANSFERS: ?Assistive device utilized: None  ?Sit to stand: SBA ?Stand to sit: SBA ? ?GAIT: ?Gait pattern: WFL ?Distance walked: 20 ?Assistive device utilized: None ?Level of assistance: CGA ?Comments: unsteadiness after BPPV treatment, CGA required ? ? ? ?PATIENT SURVEYS:  ?FOTO DFS: 48, DPS: 56.4 ? ? ?VESTIBULAR ASSESSMENT ?  ? SYMPTOM BEHAVIOR: ?  Subjective history: see subjective ?  Non-Vestibular symptoms: headaches and nausea/vomiting ?  Type of dizziness: Spinning/Vertigo and feels like her body is swaying ?  Frequency: daily ?  Duration: seconds to minute ?  Aggravating factors: Induced by position change: lying supine, rolling  to the left, supine to sit, and sit to stand, Induced by motion: turning body quickly and turning head quickly, and Worse in the morning ?  Relieving factors: closing eyes, rest, and slow movements ?  Progression of symptoms: better ? ? OCULOMOTOR EXAM: ?  Ocular Alignment: normal ?  Ocular ROM: No Limitations ?  Spontaneous Nystagmus: absent ?  Gaze-Induced Nystagmus: absent ?  Smooth Pursuits:  intact ?  Saccades: intact ?   ? ? VESTIBULAR - OCULAR REFLEX:  ?  Slow VOR: Normal ?  VOR Cancellation: Normal ?  Head-Impulse Test: HIT Right: positive ?HIT Left: positive ?   ?  ? POSITIONAL TESTING: Right Dix-Hallpike: upbeating, right nystagmus; Duration:30 seconds ?Left Dix-Hallpike: upbeating, left nystagmus; Duration: 45 seconds, low intensity ?Right Roll Test: continued R Upbeating Nystagmus; Duration: 20 seconds ?Left Roll Test: geotropic nystagmus and Geotrophic initially and then transitioned to L Upbeating Nystagmus; Duration: 15 seconds ?  ? ?MOTION SENSITIVITY: ? ?  Motion Sensitivity Quotient ? ?Intensity: 0 = none, 1 = Lightheaded, 2 = Mild, 3 = Moderate, 4 = Severe, 5 = Vomiting ? Intensity  ?1. Sitting to supine 3  ?2. Supine to L side 4  ?3. Supine to R side 4  ?4. Supine to sitting 3  ?5. L Hallpike-Dix 2  ?6. Up from L  3  ?7. R Hallpike-Dix 4  ?8. Up from R  3  ?9. Sitting, head  ?tipped to L knee   ?10. Head up from L  ?knee   ?11. Sitting, head  ?tipped to R knee   ?12. Head up from R  ?knee   ?13. Sitting head turns x5   ?14.Sitting head nods x5   ?15. In stance, 180?  ?turn to L    ?16. In stance, 180?  ?turn to R   ?  ?OTHOSTATICS: to be assessed in future session ? ? ? ?VESTIBULAR TREATMENT: ? ?Canalith Repositioning: ?  Epley Left: Number of Reps: 1, Response to Treatment: comment: unable to assess response, and Comment: moderate dizziness/nausea ? ? ?PATIENT EDUCATION: ?Education details: Educated on POC/Eval Findings ?Person educated: Patient ?Education method: Explanation ?Education comprehension: verbalized understanding ? ? ?GOALS: ?Goals reviewed with patient? Yes ? ?SHORT TERM GOALS = LONG TERM GOALS ? ? ?LONG TERM GOALS: Target date: 08/30/2021 ? ?Pt will improve DFS to >/= 60% ?Baseline: DFS: 48, DPS: 56.4 ?Goal status: INITIAL ? ?2.  Patient will demonstrate (-) positional testing to indicate resolution of BPPV ?Baseline: R and L BPPV ?Goal status: INITIAL ? ?3.  DVA TBA and  LTG to be set ?Baseline:  ?Goal status: INITIAL ? ?4.  Pt will be independent with final HEP for improved vestibular/gaze stabilization  ?Baseline:  ?Goal status: INITIAL ? ? ?ASSESSMENT: ? ?CLINICAL IMPRESSION: ?Patient is a 76 y.o. female referred to Neuro OPPT services for Vertigo. Patient's PMH significant for the following: Graves Disease, Depression, Anemia. Upon evaluation, patient presents with the following impairments: dizziness/nausea, bilateral positive HIT indicating impaired VOR, and Bilateral BPPV. With positional testing patient demonstrating rotary upbeating nystagmus with L and R Dix Hallpike indicating Bilateral Posterior Canal Canalithiasis. Mild Geotrophic nystagmus noted with L Roll Test, so potential for horizontal canal involvement, will continue to assess. Patient will benefit from skilled PT services to address impairments.  ? ? ? ?OBJECTIVE IMPAIRMENTS decreased activity tolerance, decreased balance, dizziness, and pain.  ? ?ACTIVITY LIMITATIONS cleaning, community activity, driving, and yard work.  ? ?PERSONAL FACTORS Age and 1-2 comorbidities: Graves Disease, Depression,  Anemia  are also affecting patient's functional outcome.  ? ? ?REHAB POTENTIAL: Good ? ?CLINICAL DECISION MAKING: Stable/uncomplicated ? ?EVALUATION COMPLEXITY: Low ? ? ?PLAN: ?PT FREQUENCY: 2x/week ? ?PT DURATION: 4 weeks; plus eval ? ?PLANNED INTERVENTIONS: Therapeutic exercises, Therapeutic activity, Neuromuscular re-education, Balance training, Gait training, Patient/Family education, Joint manipulation, Joint mobilization, Stair training, Vestibular training, Canalith repositioning, and DME instructions ? ?PLAN FOR NEXT SESSION: Reassess BPPV and treat as indicated. Once Resolved; Assess Orthostatics and DVA. Initiate Gaze Stabilization.  ? ? ?Jones Bales, PT, DPT ?07/31/2021, 4:55 PM ? ?

## 2021-08-06 ENCOUNTER — Ambulatory Visit: Payer: Medicare Other

## 2021-08-06 DIAGNOSIS — R2681 Unsteadiness on feet: Secondary | ICD-10-CM

## 2021-08-06 DIAGNOSIS — H8113 Benign paroxysmal vertigo, bilateral: Secondary | ICD-10-CM

## 2021-08-06 DIAGNOSIS — R42 Dizziness and giddiness: Secondary | ICD-10-CM

## 2021-08-06 NOTE — Therapy (Signed)
?OUTPATIENT PHYSICAL THERAPY VESTIBULAR TREATMENT NOTE ? ? ?Patient Name: Sara Carson ?MRN: [id number] ?DOB:1946/01/13, 76 y.o., female ?Today's Date: 08/06/2021 ? ?PCP: Lajean Manes, MD ?REFERRING PROVIDER: Lajean Manes, MD ? ? PT End of Session - 08/06/21 0757   ? ? Visit Number 2   ? Number of Visits 9   ? Date for PT Re-Evaluation 08/30/21   ? Authorization Type Medicare A+B/AARP   ? Progress Note Due on Visit 10   ? PT Start Time 0800   ? PT Stop Time 0845   ? PT Time Calculation (min) 45 min   ? Activity Tolerance Other (comment)   Nausea/Dizziness  ? Behavior During Therapy [facility] for tasks assessed/performed   ? ?  ?  ? ?  ? ? ?Past Medical History:  ?Diagnosis Date  ? Anemia   ? Depression   ? Graves disease 10/04/2020  ? Shingles   ? ?Past Surgical History:  ?Procedure Laterality Date  ? ABDOMINAL HYSTERECTOMY    ? TONSILLECTOMY    ? ?Patient Active Problem List  ? Diagnosis Date Noted  ? Syncope 07/25/2021  ? Acquired hypothyroidism 07/25/2021  ? Essential hypertension 07/25/2021  ? Hypokalemia 07/25/2021  ? Normocytic anemia 07/25/2021  ? Vertigo 07/24/2021  ? Hyperthyroidism 10/19/2020  ? ? ?ONSET DATE: 07/24/2021 ? ?REFERRING DIAG: R42 (ICD-10-CM) - Vertigo ? ?THERAPY DIAG:  ?No diagnosis found. ? ?PERTINENT HISTORY: Graves Disease, Depression, Anemia ? ?PRECAUTIONS: None ? ?SUBJECTIVE: Patient reports that has had less trouble getting up in the morning or laying down last night. Reports has not had to use the RW the last few mornings. Reports the headaches has eased off as well.  ? ?PAIN:  ?Are you having pain? No ? ? ? ?OBJECTIVE:  ? ?POSITIONAL TESTS: ? ?Right Dix-Hallpike: downbeating, right nystagmus; Duration:25 seconds ?Left Dix-Hallpike: upbeating, left nystagmus; Duration: 60 seconds   ? ?VESTIBULAR TREATMENT: ? ?Canalith Repositioning: ?Epley Right: Number of Reps: 2, Response to Treatment: symptoms worsened/converted, and Comment: transitioned R Geotrophic Nystagmus after first epley,  therefore completed BBQ Roll. Upon reassesment return of R Posterior Canal BPPV and treated Epley additional rep. On reassesment only very mild nystagmus noted indicating improved symptoms.  and BBQ Roll Right: Number of Reps: 1, Response to Treatment: symptoms improved, and Comment: No geotrophic nystagmus on reassesment, transitioned back to R Upbeating Rotary indicating continued R Posterior Canal BPPV ?   ? ? ?PATIENT EDUCATION: ?Education details: Continued BPPV ?Person educated: Patient ?Education method: Explanation ?Education comprehension: verbalized understanding ?  ?  ?GOALS: ?Goals reviewed with patient? Yes ?  ?SHORT TERM GOALS = LONG TERM GOALS ?  ?  ?LONG TERM GOALS: Target date: 08/30/2021 ?  ?Pt will improve DFS to >/= 60% ?Baseline: DFS: 48, DPS: 56.4 ?Goal status: INITIAL ?  ?2.  Patient will demonstrate (-) positional testing to indicate resolution of BPPV ?Baseline: R and L BPPV ?Goal status: INITIAL ?  ?3.  DVA TBA and LTG to be set ?Baseline:  ?Goal status: INITIAL ?  ?4.  Pt will be independent with final HEP for improved vestibular/gaze stabilization  ?Baseline:  ?Goal status: INITIAL ?  ?  ?ASSESSMENT: ?  ?CLINICAL IMPRESSION: ?Completed reassessment of BPPV. Patient demonstrating R Upbeating Nystagmus indicating R Posterior Canal. Completed CRM x 1 rep with conversion to R Geotrophic Nystagmus. Completed BBQ Roll x 1 Rep with resolution. Upon reassessment continued R Posterior Canal and completed additional rep of CRM with improved symptoms. Will continue to assess and treat as indicated.  Will continue per POC.  ?  ?  ?  ?OBJECTIVE IMPAIRMENTS decreased activity tolerance, decreased balance, dizziness, and pain.  ?  ?ACTIVITY LIMITATIONS cleaning, community activity, driving, and yard work.  ?  ?PERSONAL FACTORS Age and 1-2 comorbidities: Graves Disease, Depression, Anemia  are also affecting patient's functional outcome.  ?  ?  ?REHAB POTENTIAL: Good ?  ?CLINICAL DECISION MAKING:  Stable/uncomplicated ?  ?EVALUATION COMPLEXITY: Low ?  ?  ?PLAN: ?PT FREQUENCY: 2x/week ?  ?PT DURATION: 4 weeks; plus eval ?  ?PLANNED INTERVENTIONS: Therapeutic exercises, Therapeutic activity, Neuromuscular re-education, Balance training, Gait training, Patient/Family education, Joint manipulation, Joint mobilization, Stair training, Vestibular training, Canalith repositioning, and DME instructions ?  ?PLAN FOR NEXT SESSION: Reassess BPPV and treat as indicated. Once Resolved; Assess Orthostatics and DVA. Initiate Gaze Stabilization. ? ? ?Jones Bales, PT ?08/06/2021, 8:47 AM ? ? ?   ? ?

## 2021-08-07 DIAGNOSIS — R55 Syncope and collapse: Secondary | ICD-10-CM | POA: Diagnosis not present

## 2021-08-08 ENCOUNTER — Ambulatory Visit: Payer: Medicare Other

## 2021-08-08 DIAGNOSIS — H8113 Benign paroxysmal vertigo, bilateral: Secondary | ICD-10-CM

## 2021-08-08 DIAGNOSIS — R2681 Unsteadiness on feet: Secondary | ICD-10-CM

## 2021-08-08 DIAGNOSIS — R42 Dizziness and giddiness: Secondary | ICD-10-CM | POA: Diagnosis not present

## 2021-08-08 NOTE — Therapy (Signed)
?OUTPATIENT PHYSICAL THERAPY VESTIBULAR TREATMENT NOTE ? ? ?Patient Name: Sara Carson ?MRN: [id number] ?DOB:[birth year], 76 y.o., female ?Today's Date: 08/08/2021 ? ?PCP: Lajean Manes, MD ?REFERRING PROVIDER: Lajean Manes, MD ? ? PT End of Session - 08/08/21 0919   ? ? Visit Number 3   ? Number of Visits 9   ? Date for PT Re-Evaluation 08/30/21   ? Authorization Type Medicare A+B/AARP   ? Progress Note Due on Visit 10   ? PT Start Time [id number]   ? PT Stop Time 0955   ? PT Time Calculation (min) 32 min   ? Activity Tolerance Other (comment)   Nausea/Dizziness  ? Behavior During Therapy [facility] for tasks assessed/performed   ? ?  ?  ? ?  ? ? ?Past Medical History:  ?Diagnosis Date  ? Anemia   ? Depression   ? Graves disease 10/04/2020  ? Shingles   ? ?Past Surgical History:  ?Procedure Laterality Date  ? ABDOMINAL HYSTERECTOMY    ? TONSILLECTOMY    ? ?Patient Active Problem List  ? Diagnosis Date Noted  ? Syncope 07/25/2021  ? Acquired hypothyroidism 07/25/2021  ? Essential hypertension 07/25/2021  ? Hypokalemia 07/25/2021  ? Normocytic anemia 07/25/2021  ? Vertigo 07/24/2021  ? Hyperthyroidism 10/19/2020  ? ? ?ONSET DATE: 07/24/2021 ? ?REFERRING DIAG: R42 (ICD-10-CM) - Vertigo ? ?THERAPY DIAG:  ?Dizziness and giddiness ? ?BPPV (benign paroxysmal positional vertigo), bilateral ? ?Unsteadiness on feet ? ?PERTINENT HISTORY: Graves Disease, Depression, Anemia ? ?PRECAUTIONS: None ? ?SUBJECTIVE: Reports she has been released to drive. Patient reports that took the day to recuperate after session. Reports felt well yesterday. No other new changes.   ? ?PAIN:  ?Are you having pain? No ? ? ?OBJECTIVE:  ? ?POSITIONAL TESTS: ? ?Right Dix-Hallpike: none; Duration:0 seconds ?Left Dix-Hallpike: upbeating, left nystagmus; Duration: 30 seconds, low intensity ?Right Sidelying: none; Duration: 0 seconds ?Left Sidelying: none; Duration: 0 seconds   ? ?VESTIBULAR TREATMENT: ? ?Canalith Repositioning: ?Epley Left: Number of Reps: 2, Response  to Treatment: symptoms improved, and Comment: mild nausea, decreased duration of nystagmus and intensity noted today. No nystagmus on reassessment. Will continue to reassess   ? ? ?PATIENT EDUCATION: ?Education details: Continued BPPV  ?Person educated: Patient ?Education method: Explanation ?Education comprehension: verbalized understanding ?  ?  ?GOALS: ?Goals reviewed with patient? Yes ?  ?SHORT TERM GOALS = LONG TERM GOALS ?  ?  ?LONG TERM GOALS: Target date: 08/30/2021 ?  ?Pt will improve DFS to >/= 60% ?Baseline: DFS: 48, DPS: 56.4 ?Goal status: INITIAL ?  ?2.  Patient will demonstrate (-) positional testing to indicate resolution of BPPV ?Baseline: R and L BPPV ?Goal status: INITIAL ?  ?3.  DVA TBA and LTG to be set ?Baseline:  ?Goal status: INITIAL ?  ?4.  Pt will be independent with final HEP for improved vestibular/gaze stabilization  ?Baseline:  ?Goal status: INITIAL ?  ?  ?ASSESSMENT: ?  ?CLINICAL IMPRESSION: ?Completed reassessment of BPPV, with patient demonstrating L Upbeating Nystagmus of short duration indicating L Posterior Canal Canalithiasis. Completed CRM x 2 rep with resolution noted. R Posterior Canal and Bilat Horizontal Canals Clear today upon reassessment. Will continue to assess and treat as indicated. Will continue per POC.  ?  ?  ?  ?OBJECTIVE IMPAIRMENTS decreased activity tolerance, decreased balance, dizziness, and pain.  ?  ?ACTIVITY LIMITATIONS cleaning, community activity, driving, and yard work.  ?  ?PERSONAL FACTORS Age and 1-2 comorbidities: Graves Disease, Depression, Anemia  are  also affecting patient's functional outcome.  ?  ?  ?REHAB POTENTIAL: Good ?  ?CLINICAL DECISION MAKING: Stable/uncomplicated ?  ?EVALUATION COMPLEXITY: Low ?  ?  ?PLAN: ?PT FREQUENCY: 2x/week ?  ?PT DURATION: 4 weeks; plus eval ?  ?PLANNED INTERVENTIONS: Therapeutic exercises, Therapeutic activity, Neuromuscular re-education, Balance training, Gait training, Patient/Family education, Joint  manipulation, Joint mobilization, Stair training, Vestibular training, Canalith repositioning, and DME instructions ?  ?PLAN FOR NEXT SESSION: Reassess BPPV and treat as indicated. Once Resolved, assess DVA (update goals), and review and initiate Gaze Stabilization. ? ? ?Jones Bales, PT, DPT ?08/08/2021, 10:13 AM ? ? ?   ? ?

## 2021-08-12 ENCOUNTER — Ambulatory Visit: Payer: Medicare Other | Admitting: Physical Therapy

## 2021-08-12 ENCOUNTER — Encounter: Payer: Self-pay | Admitting: Physical Therapy

## 2021-08-12 DIAGNOSIS — E890 Postprocedural hypothyroidism: Secondary | ICD-10-CM | POA: Diagnosis not present

## 2021-08-12 DIAGNOSIS — R2681 Unsteadiness on feet: Secondary | ICD-10-CM

## 2021-08-12 DIAGNOSIS — R42 Dizziness and giddiness: Secondary | ICD-10-CM

## 2021-08-12 DIAGNOSIS — H8113 Benign paroxysmal vertigo, bilateral: Secondary | ICD-10-CM | POA: Diagnosis not present

## 2021-08-12 NOTE — Therapy (Signed)
?OUTPATIENT PHYSICAL THERAPY VESTIBULAR TREATMENT NOTE ? ? ?Patient Name: Sara Carson ?MRN: [id number] ?DOB:1945-09-29, 76 y.o., female ?Today's Date: 08/12/2021 ? ?PCP: Lajean Manes, MD ?REFERRING PROVIDER: Modena Jansky, MD ? ? PT End of Session - 08/12/21 1532   ? ? Visit Number 4   ? Number of Visits 9   ? Date for PT Re-Evaluation 08/30/21   ? Authorization Type Medicare A+B/AARP   ? Progress Note Due on Visit 10   ? PT Start Time 1532   ? PT Stop Time 1610   ? PT Time Calculation (min) 38 min   ? Activity Tolerance Patient tolerated treatment well   ? Behavior During Therapy [facility] for tasks assessed/performed   ? ?  ?  ? ?  ? ? ?Past Medical History:  ?Diagnosis Date  ? Anemia   ? Depression   ? Graves disease 10/04/2020  ? Shingles   ? ?Past Surgical History:  ?Procedure Laterality Date  ? ABDOMINAL HYSTERECTOMY    ? TONSILLECTOMY    ? ?Patient Active Problem List  ? Diagnosis Date Noted  ? Syncope 07/25/2021  ? Acquired hypothyroidism 07/25/2021  ? Essential hypertension 07/25/2021  ? Hypokalemia 07/25/2021  ? Normocytic anemia 07/25/2021  ? Vertigo 07/24/2021  ? Hyperthyroidism 10/19/2020  ? ? ?ONSET DATE: 07/24/2021 ? ?REFERRING DIAG: R42 (ICD-10-CM) - Vertigo ? ?THERAPY DIAG:  ?Dizziness and giddiness ? ?Unsteadiness on feet ? ?PERTINENT HISTORY: Graves Disease, Depression, Anemia ? ?PRECAUTIONS: None ? ?SUBJECTIVE: Feeling much better since she was last here. No spinning episodes.  ?PAIN:  ?Are you having pain? No ? ? ?OBJECTIVE:  ? ? ? ? Vestibular Assessment - 08/12/21 1544   ? ?  ? Visual Acuity  ? Static line 6   ? Dynamic line 4   ? ?  ?  ? ?  ? ? ? ?POSITIONAL TESTS: ? ?Right Dix-Hallpike: none; Duration:0 seconds ?Left Dix-Hallpike: none; Duration: 0 seconds   ? ?No dizziness or spinning ?.  ? ?mCTSIB: conditions 1-4 = 30 seconds. Mild/mod postural sway w/ condition 4 ? ? ?VESTIBULAR TREATMENT: ? ? ?   ?Gaze Adaptation: ?x1 Viewing Horizontal: Position: seated, standing, Time: x60 seconds  sitting, 2 x 60 sec standing, 2 x 30 sec standing w/ busy background, Comment: no sx, just feeling a little bit unsteady after the background.   ? ?and x1 Viewing Vertical:  Position: seated, standing, Time: x60 seconds sitting, 2 x 60 sec standing, 2 x 30 sec standing w/ busy background, Comment: no sx, just feeling a little bit unsteady after the background.  ? ?Pt had already been performing x10 reps in sitting of both given as HEP from the hospital. Upgraded to home for standing for 30 seconds w/ a busy background.  ? ? ?New addition to HEP for balance w/ eyes closed for incr vestibular input. ? ?Access Code: [email or web address] ?URL: [email or web address] ?Date: 08/12/2021 ?Prepared by: Janann August ? ?Exercises ?- Romberg Stance Eyes Closed on Foam Pad  - 1 x daily - 5 x weekly - 3 sets - 30 hold ? ? ?PATIENT EDUCATION: ?Education details: EC balance addition to HEP, progressed VOR x1 in standing to 30 sec w/ a busy background.  ?Person educated: Patient ?Education method: Explanation ?Education comprehension: verbalized understanding ? ?HEP: ? [id number] and standing VOR x1 w/ a busy background x30 seconds  ?  ?  ?GOALS: ?Goals reviewed with patient? Yes ?  ?SHORT TERM GOALS = LONG TERM GOALS ?  ?  ?LONG TERM  GOALS: Target date: 08/30/2021 ?  ?Pt will improve DFS to >/= 60% ?Baseline: DFS: 48, DPS: 56.4 ?Goal status: INITIAL ?  ?2.  Patient will demonstrate (-) positional testing to indicate resolution of BPPV ?Baseline: R and L BPPV ?Goal status: INITIAL ?  ?3.  DVA TBA and LTG to be set ?Baseline: 2 line difference - LTG not needed.  ?Goal status: MET ?  ?4.  Pt will be independent with final HEP for improved vestibular/gaze stabilization  ?Baseline:  ?Goal status: INITIAL ?  ?  ?ASSESSMENT: ?  ?CLINICAL IMPRESSION: ?Completed reassessment of BPPV, with pt demonstrating negative R and L Dix Hallpike testing with no dizziness/nystagmus, indicating resolution of BPPV. Remainder of session was upgrading  pt's VOR for HEP (was doing 10 reps seated from hospital) - had pt perform in standing with a busy background. Also added EC and feet together on foam to HEP due to mild/mod postural sway w/ mCTSIB. Pt is feeling much better. Will continue to progress towards LTGs.   ? ?  ?  ?OBJECTIVE IMPAIRMENTS decreased activity tolerance, decreased balance, dizziness, and pain.  ?  ?ACTIVITY LIMITATIONS cleaning, community activity, driving, and yard work.  ?  ?PERSONAL FACTORS Age and 1-2 comorbidities: Graves Disease, Depression, Anemia  are also affecting patient's functional outcome.  ?  ?  ?REHAB POTENTIAL: Good ?  ?CLINICAL DECISION MAKING: Stable/uncomplicated ?  ?EVALUATION COMPLEXITY: Low ?  ?  ?PLAN: ?PT FREQUENCY: 2x/week ?  ?PT DURATION: 4 weeks; plus eval ?  ?PLANNED INTERVENTIONS: Therapeutic exercises, Therapeutic activity, Neuromuscular re-education, Balance training, Gait training, Patient/Family education, Joint manipulation, Joint mobilization, Stair training, Vestibular training, Canalith repositioning, and DME instructions ?  ?PLAN FOR NEXT SESSION: Reassess BPPV and treat as indicated. Upgrade Gaze Stabilization. Balance w/ EC and head motions  ? ? ?Arliss Journey, PT, DPT ?08/12/2021, 4:20 PM ? ? ?   ? ? ? ? ?   ? ? ?

## 2021-08-14 ENCOUNTER — Encounter: Payer: Self-pay | Admitting: Physical Therapy

## 2021-08-14 ENCOUNTER — Ambulatory Visit: Payer: Medicare Other | Admitting: Physical Therapy

## 2021-08-14 VITALS — BP 119/73 | HR 69

## 2021-08-14 DIAGNOSIS — R2681 Unsteadiness on feet: Secondary | ICD-10-CM | POA: Diagnosis not present

## 2021-08-14 DIAGNOSIS — R42 Dizziness and giddiness: Secondary | ICD-10-CM | POA: Diagnosis not present

## 2021-08-14 DIAGNOSIS — H8113 Benign paroxysmal vertigo, bilateral: Secondary | ICD-10-CM | POA: Diagnosis not present

## 2021-08-14 NOTE — Therapy (Signed)
?OUTPATIENT PHYSICAL THERAPY VESTIBULAR TREATMENT NOTE ? ? ?Patient Name: Sara Carson ?MRN: [id number] ?DOB:[birth year], 76 y.o., female ?Today's Date: 08/14/2021 ? ?PCP: Lajean Manes, MD ?REFERRING PROVIDER: Lajean Manes, MD ? ? PT End of Session - 08/14/21 1451   ? ? Visit Number 5   ? Number of Visits 9   ? Date for PT Re-Evaluation 08/30/21   ? Authorization Type Medicare A+B/AARP   ? Progress Note Due on Visit 10   ? PT Start Time 1791   ? PT Stop Time 5056   ? PT Time Calculation (min) 28 min   ? Activity Tolerance Patient tolerated treatment well   ? Behavior During Therapy [facility] for tasks assessed/performed   ? ?  ?  ? ?  ? ? ?Past Medical History:  ?Diagnosis Date  ? Anemia   ? Depression   ? Graves disease 10/04/2020  ? Shingles   ? ?Past Surgical History:  ?Procedure Laterality Date  ? ABDOMINAL HYSTERECTOMY    ? TONSILLECTOMY    ? ?Patient Active Problem List  ? Diagnosis Date Noted  ? Syncope 07/25/2021  ? Acquired hypothyroidism 07/25/2021  ? Essential hypertension 07/25/2021  ? Hypokalemia 07/25/2021  ? Normocytic anemia 07/25/2021  ? Vertigo 07/24/2021  ? Hyperthyroidism 10/19/2020  ? ? ?ONSET DATE: 07/24/2021 ? ?REFERRING DIAG: R42 (ICD-10-CM) - Vertigo ? ?THERAPY DIAG:  ?Dizziness and giddiness ? ?Unsteadiness on feet ? ?PERTINENT HISTORY: Graves Disease, Depression, Anemia ? ?PRECAUTIONS: None ? ?SUBJECTIVE: Had a minor dizzy spell when she was walking around the house. Still careful getting up in the morning and anticipates being dizzy. Feeling fuzzyheaded today like she isn't thinking clearly.  ? ?PAIN:  ?Are you having pain? No ? ? ?Vitals:  ? 08/14/21 1454  ?BP: 119/73  ?Pulse: 69  ? ? ? ? ?POSITIONAL TESTS: ? ?Right Dix-Hallpike: none; Duration:0 seconds. Pt reporting she felt it might have been coming on, but it never did and no nystagmus noticed. Re-assessed a second time with pt not feeling any kind of sensation or dizziness.  ?Left Dix-Hallpike: none; Duration: 0 seconds ?Right Sidelying:  none; Duration: 0 seconds   ? ? ? ? [facility] PT Assessment - 08/14/21 1511   ? ?  ? Functional Gait  Assessment  ? Gait assessed  Yes   ? Gait Level Surface Walks 20 ft in less than 5.5 sec, no assistive devices, good speed, no evidence for imbalance, normal gait pattern, deviates no more than 6 in outside of the 12 in walkway width.   5.19  ? Change in Gait Speed Able to smoothly change walking speed without loss of balance or gait deviation. Deviate no more than 6 in outside of the 12 in walkway width.   ? Gait with Horizontal Head Turns Performs head turns smoothly with no change in gait. Deviates no more than 6 in outside 12 in walkway width   ? Gait with Vertical Head Turns Performs head turns with no change in gait. Deviates no more than 6 in outside 12 in walkway width.   ? Gait and Pivot Turn Pivot turns safely within 3 sec and stops quickly with no loss of balance.   ? Step Over Obstacle Is able to step over 2 stacked shoe boxes taped together (9 in total height) without changing gait speed. No evidence of imbalance.   ? Gait with Narrow Base of Support Ambulates 7-9 steps.   ? Gait with Eyes Closed Walks 20 ft, uses assistive device, slower speed,  mild gait deviations, deviates 6-10 in outside 12 in walkway width. Ambulates 20 ft in less than 9 sec but greater than 7 sec.   7.72  ? Ambulating Backwards Walks 20 ft, no assistive devices, good speed, no evidence for imbalance, normal gait   ? Steps Alternating feet, no rail.   ? Total Score 28   ? ?  ?  ? ?  ? ? ? ? ?PATIENT EDUCATION: ?Education details: Results of FGA, pt still negative for positional testing, plan to keep one remaining visit and pt will call back if any dizziness returns or will call to cancel in case pt does not need last appt.  ?Person educated: Patient ?Education method: Explanation ?Education comprehension: verbalized understanding ? ?HEP: ? [id number] and standing VOR x1 w/ a busy background x30 seconds  ?  ?  ?GOALS: ?Goals reviewed with  patient? Yes ?  ?SHORT TERM GOALS = LONG TERM GOALS ?  ?  ?LONG TERM GOALS: Target date: 08/30/2021 ?  ?Pt will improve DFS to >/= 60% ?Baseline: DFS: 48, DPS: 56.4 ?Goal status: INITIAL ?  ?2.  Patient will demonstrate (-) positional testing to indicate resolution of BPPV ?Baseline: R and L BPPV ?Goal status: INITIAL ?  ?3.  DVA TBA and LTG to be set ?Baseline: 2 line difference - LTG not needed.  ?Goal status: MET ?  ?4.  Pt will be independent with final HEP for improved vestibular/gaze stabilization  ?Baseline:  ?Goal status: INITIAL ?  ?  ?ASSESSMENT: ?  ?CLINICAL IMPRESSION: ?Completed reassessment of BPPV just to make sure with pt continuing to demonstrate negative BPPV testing. Assessed FGA with pt scoring a 28/30, indicating no fall risk. Pt is pleased with her progress and is not feeling dizzy anymore. Pt will cancel all remainder of appts except for the last one on May 11th. Pt will call back if dizziness returns or to cancel last visit if it isn't needed. Pt in agreement with plan.  ?  ?  ?OBJECTIVE IMPAIRMENTS decreased activity tolerance, decreased balance, dizziness, and pain.  ?  ?ACTIVITY LIMITATIONS cleaning, community activity, driving, and yard work.  ?  ?PERSONAL FACTORS Age and 1-2 comorbidities: Graves Disease, Depression, Anemia  are also affecting patient's functional outcome.  ?  ?  ?REHAB POTENTIAL: Good ?  ?CLINICAL DECISION MAKING: Stable/uncomplicated ?  ?EVALUATION COMPLEXITY: Low ?  ?  ?PLAN: ?PT FREQUENCY: 2x/week ?  ?PT DURATION: 4 weeks; plus eval ?  ?PLANNED INTERVENTIONS: Therapeutic exercises, Therapeutic activity, Neuromuscular re-education, Balance training, Gait training, Patient/Family education, Joint manipulation, Joint mobilization, Stair training, Vestibular training, Canalith repositioning, and DME instructions ?  ?PLAN FOR NEXT SESSION: Did BPPV return? Waiting for pt to call. If pt calls, re-assess, otherwise pt can be D/Ced from PT. Will need to do FOTO!!  ? ? ?Arliss Journey, PT, DPT ?08/14/2021, 3:21 PM ? ? ?   ? ? ? ? ?   ? ? ?

## 2021-08-20 ENCOUNTER — Encounter: Payer: Medicare Other | Admitting: Physical Therapy

## 2021-08-21 ENCOUNTER — Ambulatory Visit (INDEPENDENT_AMBULATORY_CARE_PROVIDER_SITE_OTHER): Payer: Medicare Other | Admitting: Cardiovascular Disease

## 2021-08-21 ENCOUNTER — Encounter: Payer: Self-pay | Admitting: Cardiovascular Disease

## 2021-08-21 DIAGNOSIS — E0500 Thyrotoxicosis with diffuse goiter without thyrotoxic crisis or storm: Secondary | ICD-10-CM | POA: Diagnosis not present

## 2021-08-21 DIAGNOSIS — I2510 Atherosclerotic heart disease of native coronary artery without angina pectoris: Secondary | ICD-10-CM | POA: Diagnosis not present

## 2021-08-21 DIAGNOSIS — R42 Dizziness and giddiness: Secondary | ICD-10-CM

## 2021-08-21 DIAGNOSIS — R002 Palpitations: Secondary | ICD-10-CM

## 2021-08-21 DIAGNOSIS — E0590 Thyrotoxicosis, unspecified without thyrotoxic crisis or storm: Secondary | ICD-10-CM

## 2021-08-21 DIAGNOSIS — I2584 Coronary atherosclerosis due to calcified coronary lesion: Secondary | ICD-10-CM | POA: Diagnosis not present

## 2021-08-21 DIAGNOSIS — E890 Postprocedural hypothyroidism: Secondary | ICD-10-CM | POA: Diagnosis not present

## 2021-08-21 MED ORDER — ATENOLOL 25 MG PO TABS: ORAL_TABLET | ORAL | 3 refills | Status: DC

## 2021-08-21 NOTE — Progress Notes (Signed)
? ?Cardiology Office Note   ? ?Date:  08/25/2021  ? ?ID:  Sara Carson, DOB November 01, 1945, MRN [id number] ? ?PCP:  Lajean Manes, MD  ?Cardiologist:  Shelva Majestic, MD  ? ?38monthF/U cardiology evaluation initially referred by Dr. SFelipa Ethfor possible pulmonary hypertension. ? ? ?History of Present Illness:  ?Sara ERRICKSONis a 76y.o. female who is followed by Dr. SFelipa Eth  States that she was in her usual health until approximately 5 days after she took her second COVID booster this year.  Approximately 5 days later she started to notice increased heart rate currently was diagnosed with hyperthyroidism in late May 2022.  On presentation in addition to her tachycardia, she had weakness, and weight loss. She apparently was seen by Dr. SRenato Shinand did not tolerate Tapazole secondary to elevated transaminases nausea and itching.  [facility].  Following therapy her free T4 has decreased from 4.12 most recently at 1.7.  With her palpitations which initiated in May she was started on atenolol with improvement in her heart rate and she presently continues to take 25 mg twice a day.  [place] with normal systolic and diastolic function.  Her left atrium was moderately dilated.  Her right ventricle was borderline dilated and there was mild dilatation of her right atrium.  Her mitral valve was structurally normal and there was mild mitral regurgitation.  She was estimated to have possible moderate pulmonary hypertension.  Because of pulmonary hypertension on echocardiographic assessment, she was referred by Dr. SFelipa Ethfor cardiology evaluation. ? ?When I saw her for initial evaluation with me on December 14, 2020 she felt improved with reference to her previous sudden development of hyperthyroidism and Graves' disease.  Previous weakness and  nausea had resolved.  She was unaware of any significant palpitations, presyncope or syncope.  She denied any chest tightness, PND, or orthopnea.  ? ?Since I initially saw her, [facility]  She has continued to be on atenolol 25 mg twice a day.  There has been resolution of her increased heart rate and palpitations.  Her tremors have resolved.  She recently underwent follow-up laboratory on January 25, 2021 which now shows TSH at 26.9.  She has not yet been notified about initiation of any levothyroxine and has a scheduled appointment [place] [facility] [place] [facility] [facility] [place]. ? ?I recommended she undergo a follow-up echo Doppler study to reassess her previous pulmonary hypertension.  The echo evaluation was done on January 14, 2021 shows normal LV function with EF 55 to 60% with normal diastolic parameters.  There was mild aortic sclerosis without stenosis.  RV and PA pressures were normal at 28.6 mm. ? ?I saw her on January 28, 2021 at which time she felt well and denied any chest pain, palpitations or lethargy.  She had gained approximately 5 pounds.  Her ECG showed mild sinus arrhythmia with isolated PAC.  Blood pressure was controlled.  He was wondering about possibly being able to reduce mentally stop her atenolol.  At that time, I recommended slight reduction of atenolol from 25 mg twice a day to 25 mg in the morning and 12.5 mg daily.  If her heart rate was to increase he was advised to increase her atenolol  back to her prior dose.  He was started to develop hypothyroidism on her most recent TSH. ? ?Subsequently, she has been followed at [facility] and on March 22, 2021 so [name], FNP for a post ablative hypothyroidism.  She was told to initiate levothyroxine 88 mcg daily.  Her TSH level had increased to 26.9 but most recently had again been over suppressed at 0.15 associated with increased  palpitations.  I last saw her on May 29, 2021 at which time she was experiencing some fatigability and denied any chest pain or palpitations.   ? ?She was recently hospitalized on July 24, 2021 and discharged in July 26, 2021.  She presented with nausea, dizziness, vertigo and had unwitnessed syncope and a fall.  She underwent extensive imaging including CT of her head, MRI of her head, MRA of her head and neck which all were negative for acute findings.  It was felt that her syncope was due to severe symptomatic vertigo.  She was hypokalemic and had replacement.  An echo Doppler study was done in July 24, 2021 which showed normal LV function with EF 60 to 65% and grade 1 diastolic dysfunction.  There were no wall motion abnormalities.  RV systolic pressure was normal at 19 mm. ? ?Presently, she feels well.  She continues to be on atenolol 12.5 mg daily and levothyroxine at 75 mg 6 days a week and 37.5 mg 1 day a week.  Her most recent TSH had normalized at 0.713 on July 25, 2021.  She presents for follow-up evaluation. ? ?Past Medical History:  ?Diagnosis Date  ? Anemia   ? Depression   ? Graves disease 10/04/2020  ? Shingles   ? ? ?Past Surgical History:  ?Procedure Laterality Date  ? ABDOMINAL HYSTERECTOMY    ? TONSILLECTOMY    ? ? ?Current Medications: ?Outpatient Medications Prior to Visit  ?Medication Sig Dispense Refill  ? citalopram (CELEXA) 10 MG tablet Take 10 mg by mouth daily.    ? cycloSPORINE (RESTASIS) 0.05 % ophthalmic emulsion Place 1 drop into both eyes 2 (two) times daily.    ? levothyroxine (SYNTHROID) 75 MCG tablet Take 37.5-75 mcg by mouth See admin instructions. Take 75 mcg by mouth in the morning before breakfast SIX days a week and 37.5 mcg ONE day a week    ? atenolol (TENORMIN) 25 MG tablet Take 12.5 mg by mouth every morning    ? meclizine (ANTIVERT) 25 MG tablet Take 1 tablet (25 mg total) by mouth 3 (three) times daily as needed for dizziness or nausea. (Patient not taking: Reported  on 08/21/2021) 30 tablet 0  ? ?No facility-administered medications prior to visit.  ?  ? ?Allergies:   Levothyroxine, Methimazole, Codeine, Levaquin [levofloxacin in d5w], Levofloxacin, and Amoxicillin  ? ?Social History  ? ?Socioeconomic History  ? Marital status: Widowed  ?  Spouse name: Not on file  ? Number of children: Not on file  ? Years of education: Not on file  ? Highest education level: Not on file  ?Occupational History  ? Not on file  ?Tobacco Use  ? Smoking status: Never  ? Smokeless tobacco: Never  ?Vaping Use  ? Vaping Use: Never used  ?Substance and Sexual Activity  ? Alcohol use: Not Currently  ?  Alcohol/week: 3.0 standard drinks  ?  Types: 3 Glasses of wine per week  ? Drug use: Never  ? Sexual activity: Not on file  ?Other Topics Concern  ?  Not on file  ?Social History Narrative  ? Not on file  ? ?Social Determinants of Health  ? ?Financial Resource Strain: Not on file  ?Food Insecurity: Not on file  ?Transportation Needs: Not on file  ?Physical Activity: Not on file  ?Stress: Not on file  ?Social Connections: Not on file  ?  ?Socially, she is widowed for 5 years.  She does not have children.  She lives alone.  She had worked as a Holiday representative and hospice.  She graduated from [employer] and attained a Production designer, theatre/television/film at [employer].  There is no tobacco history.  She drinks rare to an occasional glass of wine.  She works out regularly 4 days/week and has a Physiological scientist doing strength training and aerobic exercise for 30 to 60 minutes. ? ?Family History:  The patient's family history includes Cancer in her mother; Diabetes in her father; Hyperlipidemia in her father; Hypertension in her father.  ?Her mother died at age 60 and had metastatic breast cancer.  Her father died at age 60 and had diabetes and gangrene.  She has 2 sisters ages 25 and 18. ? ?ROS ?General: Negative; No fevers, chills, or night sweats;  ?HEENT: Negative; No changes in vision or hearing, sinus congestion,  difficulty swallowing ?Pulmonary: Negative; No cough, wheezing, shortness of breath, hemoptysis ?Cardiovascular: see HPI ?GI: Negative; No nausea, vomiting, diarrhea, or abdominal pain ?GU: Negative; No dysu

## 2021-08-21 NOTE — Patient Instructions (Addendum)
Medication Instructions:  ? ? Decrease dose to  Atenolol 12.5 mg every other day  ? ?*If you need a refill on your cardiac medications before your next appointment, please call your pharmacy* ? ? ?Lab Work: ?Fasting -lipid ?CMP ?magnesium ?If you have labs (blood work) drawn today and your tests are completely normal, you will receive your results only by: ?MyChart Message (if you have MyChart) OR ?A paper copy in the mail ?If you have any lab test that is abnormal or we need to change your treatment, we will call you to review the results. ? ? ?Testing/Procedures: ? CT coronary calcium score.  ? ?Test locations:  ?[facility] ([address] [place], [place] [address]) ?[facility] ([address] [place], [place] [address])  ? ?This is $99 out of pocket. ? ? ?Coronary CalciumScan ?A coronary calcium scan is an imaging test used to look for deposits of calcium and other fatty materials (plaques) in the inner lining of the blood vessels of the heart (coronary arteries). These deposits of calcium and plaques can partly clog and narrow the coronary arteries without producing any symptoms or warning signs. This puts a person at risk for a heart attack. This test can detect these deposits before symptoms develop. ?Tell a health care provider about: ?Any allergies you have. ?All medicines you are taking, including vitamins, herbs, eye drops, creams, and over-the-counter medicines. ?Any problems you or family members have had with anesthetic medicines. ?Any blood disorders you have. ?Any surgeries you have had. ?Any medical conditions you have. ?Whether you are pregnant or may be pregnant. ?What are the risks? ?Generally, this is a safe procedure. However, problems may occur, including: ?Harm to a pregnant woman and her unborn baby. This test involves the use of radiation. Radiation exposure can be dangerous to a pregnant woman and her unborn baby. If you are pregnant, you generally should not have this  procedure done. ?Slight increase in the risk of cancer. This is because of the radiation involved in the test. ?What happens before the procedure? ?No preparation is needed for this procedure. ?What happens during the procedure? ?You will undress and remove any jewelry around your neck or chest. ?You will put on a hospital gown. ?Sticky electrodes will be placed on your chest. The electrodes will be connected to an electrocardiogram (ECG) machine to record a tracing of the electrical activity of your heart. ?A CT scanner will take pictures of your heart. During this time, you will be asked to lie still and hold your breath for 2-3 seconds while a picture of your heart is being taken. ?The procedure may vary among health care providers and hospitals. ?What happens after the procedure? ?You can get dressed. ?You can return to your normal activities. ?It is up to you to get the results of your test. Ask your health care provider, or the department that is doing the test, when your results will be ready. ?Summary ?A coronary calcium scan is an imaging test used to look for deposits of calcium and other fatty materials (plaques) in the inner lining of the blood vessels of the heart (coronary arteries). ?Generally, this is a safe procedure. Tell your health care provider if you are pregnant or may be pregnant. ?No preparation is needed for this procedure. ?A CT scanner will take pictures of your heart. ?You can return to your normal activities after the scan is done. ?This information is not intended to replace advice given to you by your health  care provider. Make sure you discuss any questions you have with your health care provider. ?Document Released: 10/04/2007 Document Revised: 02/25/2016 Document Reviewed: 02/25/2016 ?Elsevier Interactive Patient Education ? 2017 [employer].  ? ?Follow-Up: ?At [facility], you and your health needs are our priority.  As part of our continuing mission to provide you with  exceptional heart care, we have created designated Provider Care Teams.  These Care Teams include your primary Cardiologist (physician) and Advanced Practice Providers (APPs -  Physician Assistants and Nurse Practitioners) who all work together to provide you with the care you need, when you need it. ? ?  ? ?Your next appointment:   ?3 to 4 month(s) ? ?The format for your next appointment:   ?In Person ? ?Provider:   ?Shelva Majestic, MD  ? ? ? ?

## 2021-08-25 ENCOUNTER — Encounter: Payer: Self-pay | Admitting: Cardiovascular Disease

## 2021-08-27 ENCOUNTER — Encounter: Payer: Medicare Other | Admitting: Physical Therapy

## 2021-08-29 ENCOUNTER — Encounter: Payer: Medicare Other | Admitting: Physical Therapy

## 2021-09-03 DIAGNOSIS — I2510 Atherosclerotic heart disease of native coronary artery without angina pectoris: Secondary | ICD-10-CM | POA: Diagnosis not present

## 2021-09-03 DIAGNOSIS — I2584 Coronary atherosclerosis due to calcified coronary lesion: Secondary | ICD-10-CM | POA: Diagnosis not present

## 2021-09-03 DIAGNOSIS — R002 Palpitations: Secondary | ICD-10-CM | POA: Diagnosis not present

## 2021-09-03 DIAGNOSIS — E0590 Thyrotoxicosis, unspecified without thyrotoxic crisis or storm: Secondary | ICD-10-CM | POA: Diagnosis not present

## 2021-09-03 LAB — COMPREHENSIVE METABOLIC PANEL
ALT: 10 IU/L (ref 0–32)
AST: 29 IU/L (ref 0–40)
Albumin/Globulin Ratio: 2 (ref 1.2–2.2)
Albumin: 4.3 g/dL (ref 3.7–4.7)
Alkaline Phosphatase: 76 IU/L (ref 44–121)
BUN/Creatinine Ratio: 19 (ref 12–28)
BUN: 12 mg/dL (ref 8–27)
Bilirubin Total: 0.4 mg/dL (ref 0.0–1.2)
CO2: 24 mmol/L (ref 20–29)
Calcium: 9.4 mg/dL (ref 8.7–10.3)
Chloride: 96 mmol/L (ref 96–106)
Creatinine, Ser: 0.62 mg/dL (ref 0.57–1.00)
Globulin, Total: 2.2 g/dL (ref 1.5–4.5)
Glucose: 85 mg/dL (ref 70–99)
Potassium: 4.8 mmol/L (ref 3.5–5.2)
Sodium: 133 mmol/L — ABNORMAL LOW (ref 134–144)
Total Protein: 6.5 g/dL (ref 6.0–8.5)
eGFR: 93 mL/min/{1.73_m2} (ref >59)

## 2021-09-03 LAB — LIPID PANEL
Chol/HDL Ratio: 1.9 ratio (ref 0.0–4.4)
Cholesterol, Total: 205 mg/dL — ABNORMAL HIGH (ref 100–199)
HDL: 109 mg/dL (ref >39)
LDL Chol Calc (NIH): 87 mg/dL (ref 0–99)
Triglycerides: 46 mg/dL (ref 0–149)
VLDL Cholesterol Cal: 9 mg/dL (ref 5–40)

## 2021-09-03 LAB — MAGNESIUM: Magnesium: 1.9 mg/dL (ref 1.6–2.3)

## 2021-09-18 DIAGNOSIS — E890 Postprocedural hypothyroidism: Secondary | ICD-10-CM | POA: Diagnosis not present

## 2021-09-24 DIAGNOSIS — Z923 Personal history of irradiation: Secondary | ICD-10-CM | POA: Diagnosis not present

## 2021-09-24 DIAGNOSIS — E890 Postprocedural hypothyroidism: Secondary | ICD-10-CM | POA: Diagnosis not present

## 2021-09-24 DIAGNOSIS — Z79890 Hormone replacement therapy (postmenopausal): Secondary | ICD-10-CM | POA: Diagnosis not present

## 2021-10-09 ENCOUNTER — Ambulatory Visit (INDEPENDENT_AMBULATORY_CARE_PROVIDER_SITE_OTHER)
Admission: RE | Admit: 2021-10-09 | Discharge: 2021-10-09 | Disposition: A | Discharge status: Home / Self Care | Payer: Self-pay | Source: Ambulatory Visit | Attending: Cardiovascular Disease | Admitting: Cardiovascular Disease

## 2021-10-09 DIAGNOSIS — I2584 Coronary atherosclerosis due to calcified coronary lesion: Secondary | ICD-10-CM

## 2021-10-09 DIAGNOSIS — K529 Noninfective gastroenteritis and colitis, unspecified: Secondary | ICD-10-CM | POA: Diagnosis not present

## 2021-10-09 DIAGNOSIS — I2510 Atherosclerotic heart disease of native coronary artery without angina pectoris: Secondary | ICD-10-CM

## 2021-10-09 DIAGNOSIS — R002 Palpitations: Secondary | ICD-10-CM

## 2021-10-09 IMAGING — CT CT CARDIAC CORONARY ARTERY CALCIUM SCORE
3 series · 14 of 20 positions shown, 16 images · non-contrast
Comparison: None.
COMPARISON: None.

Addendum:
EXAM:
OVER-READ INTERPRETATION  CT CHEST

The following report is an over-read performed by radiologist Dr.
over-read does not include interpretation of cardiac or coronary
anatomy or pathology. The coronary calcium score interpretation by
the cardiologist is attached.
CLINICAL DATA: Cardiovascular disease risk stratification
CAD screening, intermediate CAD risk, not treadmill candidate mild
coronary calcification shown on Ct scan, hyperthyroidism
CT Coronary Calcium Score
TECHNIQUE: A gated, non-contrast computed tomography scan of the heart was
performed using 3mm slice thickness. Axial images were analyzed on a
dedicated workstation. Calcium scoring of the coronary arteries was
performed using the Agatston method.

[Series 2: cascseq 2.0 sa36 (id) (id) · axial · 0.33mm/px · z∈[+1454,+1534]mm · 4 of 68 slices shown]
[im 14/68  vessel]
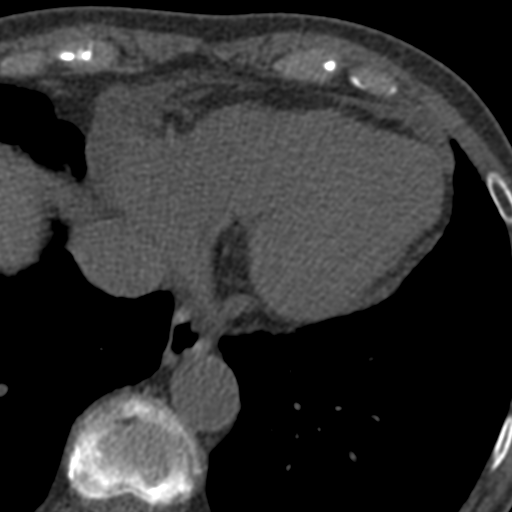
[im 27/68  vessel]
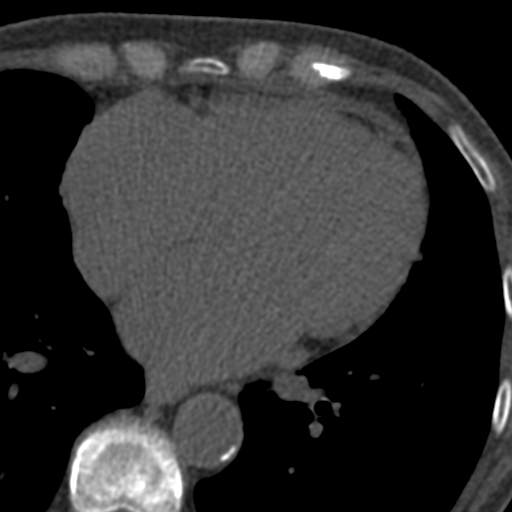
[im 41/68  vessel]
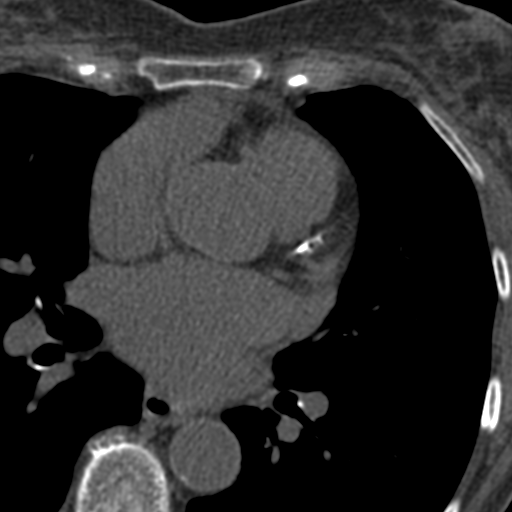
[im 54/68  vessel]
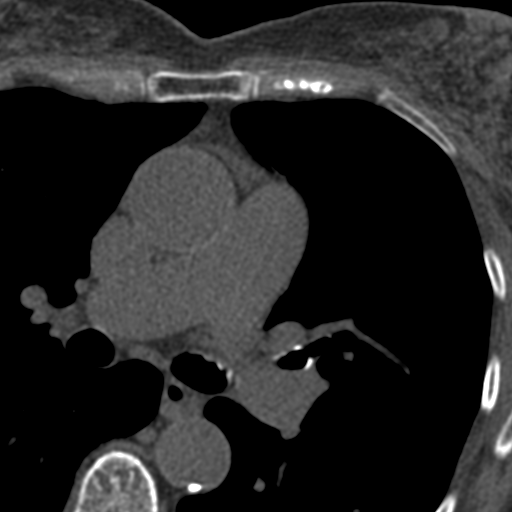

[Series 3: cascseq 2.0 bf37 st · axial · 0.54mm/px · z∈[+1450,+1538]mm · 5 of 68 slices shown, 7 images]
[im 12/68  vessel]
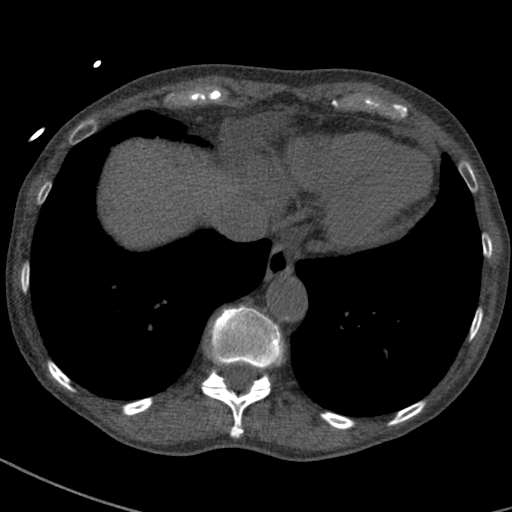
[im 12/68  lung]
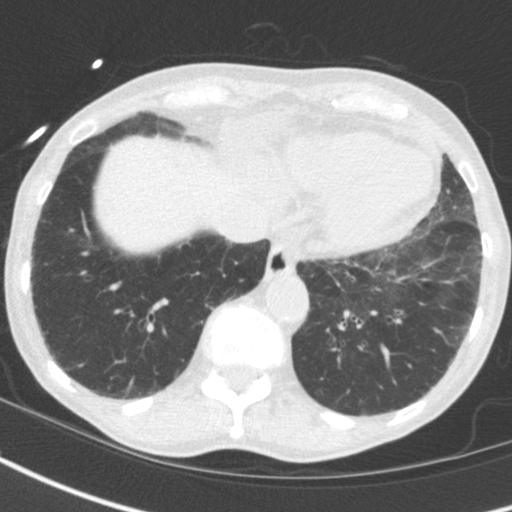
[im 23/68  vessel]
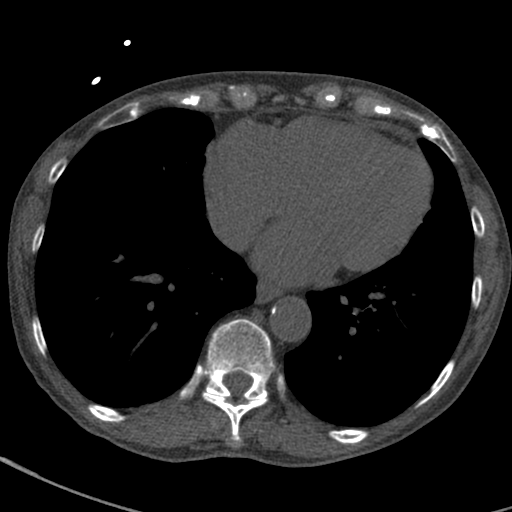
[im 34/68  vessel]
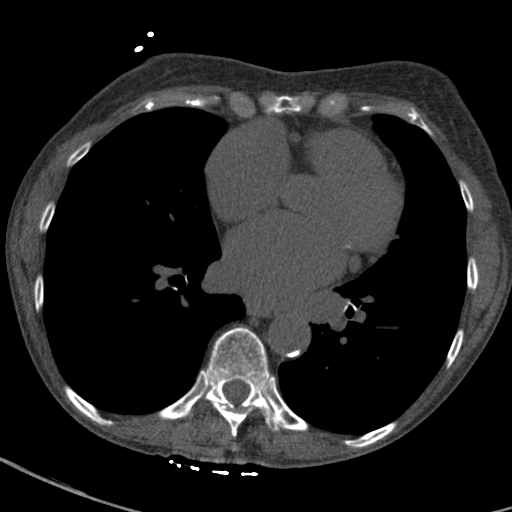
[im 45/68  vessel]
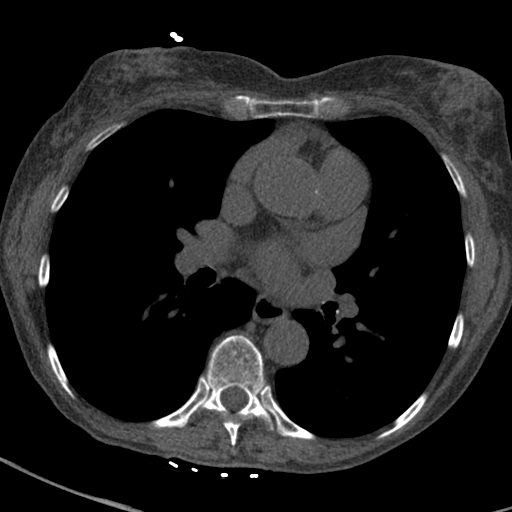
[im 56/68  vessel]
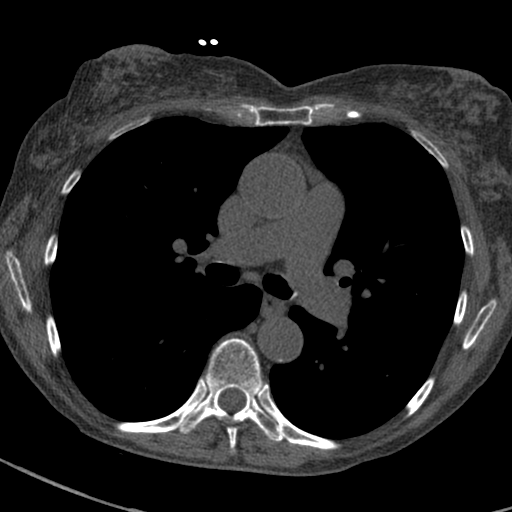
[im 56/68  lung]
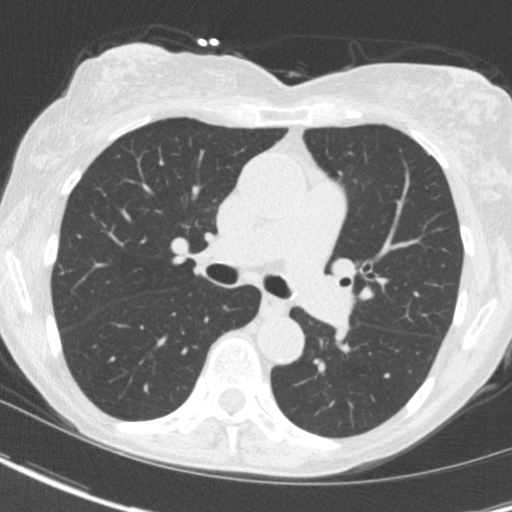

[Series 4: cascseq 2.0 br59 lung · axial · 0.54mm/px · z∈[+1450,+1538]mm · 5 of 68 slices shown]
[im 12/68  lung]
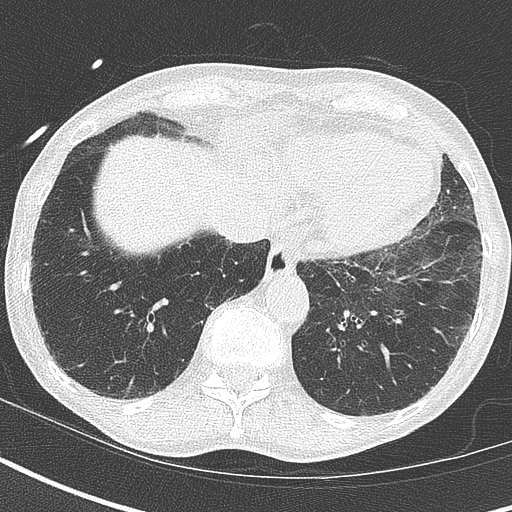
[im 23/68  lung]
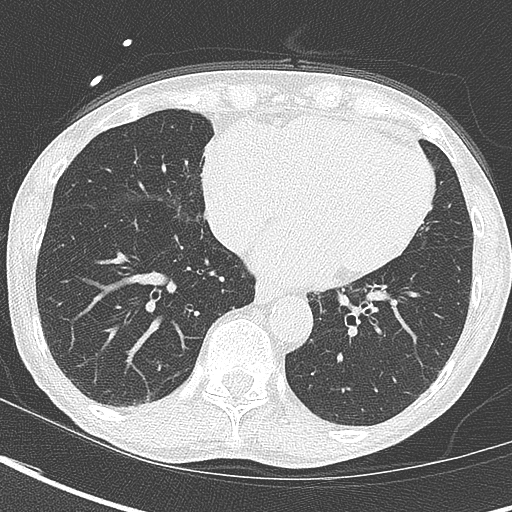
[im 34/68  lung]
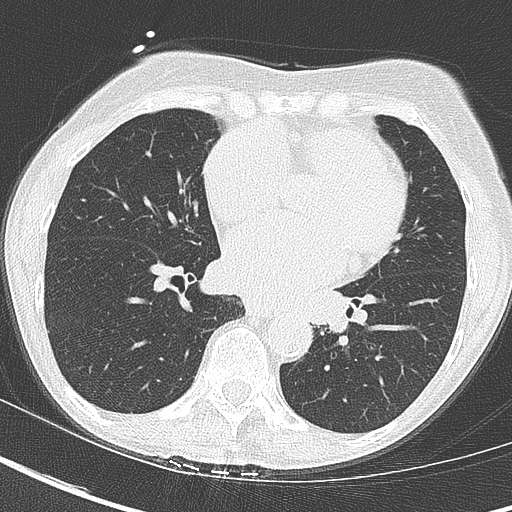
[im 45/68  lung]
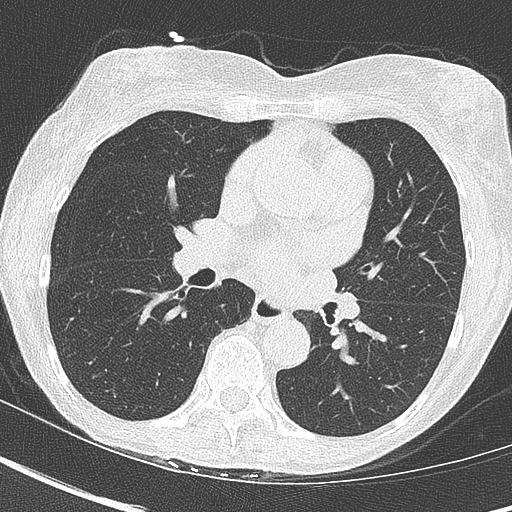
[im 56/68  lung]
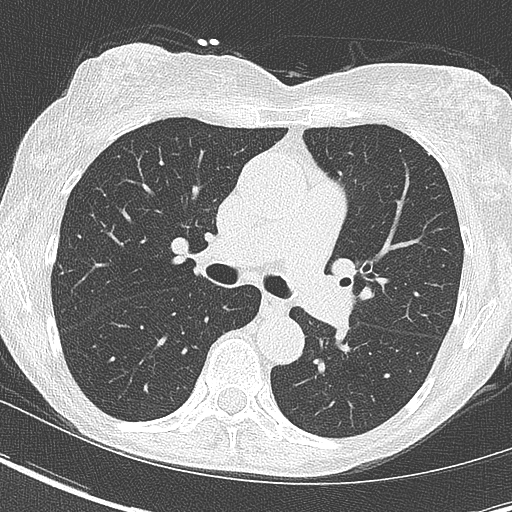

[14 of 20 positions shown; findings below may reference images not displayed]

FINDINGS: Vascular: Normal heart size. Trace pericardial effusion. Normal
caliber thoracic aorta with calcific atherosclerotic disease.

Mediastinum/Nodes: Patulous esophagus. No pathologically enlarged
lymph nodes seen in the chest.

Lungs/Pleura: Central airways are patent. Mild bibasilar
atelectasis. No consolidation, pleural effusion or pneumothorax.

Upper Abdomen: No acute abnormality.

Musculoskeletal: No chest wall mass or suspicious bone lesions
identified.
IMPRESSION: 1. No acute extracardiac abnormalities.
2.  Aortic Atherosclerosis ([JN]-[JN]).
FINDINGS: Coronary Calcium Score:

Left main: 0

Left anterior descending artery:

Left circumflex artery: 0

Right coronary artery: 0

Total:

Percentile: 57th

Pericardium: Normal.  Trivial anterior pericardial effusion.

Ascending Aorta: Normal caliber. Ascending aorta measures
approximately 35mm at the mid ascending aorta measured in an axial
plane. Aortic atherosclerosis.

Non-cardiac: See separate report from [REDACTED].
IMPRESSION: Coronary calcium score of 83.7. This was 57th percentile for age-,
race-, and sex-matched controls.



If CAC=0, it is reasonable to withhold statin therapy and reassess
in 5 to 10 years, as long as higher risk conditions are absent
(diabetes mellitus, family history of premature CHD in first degree
relatives (males <55 years; females <65 years), cigarette smoking,
or LDL >=190 mg/dL).

If CAC is 1 to 99, it is reasonable to initiate statin therapy for
patients >=55 years of age.

If CAC is >=100 or >=75th percentile, it is reasonable to initiate
statin therapy at any age.

Cardiology referral should be considered for patients with CAC
scores >=400 or >=75th percentile.

*[JN] AHA/ACC/AACVPR/AAPA/ABC/DOOMUN/DOOMUN/DOOMUN/DOOMUN/DOOMUN/DOOMUN/DOOMUN
Guideline on the Management of Blood Cholesterol: A Report of the
American College of Cardiology/American Heart Association Task Force
on Clinical Practice Guidelines. J Am Coll Cardiol.
[JN];73(24):[PHONE_NUMBER].

*** End of Addendum ***
EXAM:
OVER-READ INTERPRETATION  CT CHEST

The following report is an over-read performed by radiologist Dr.
over-read does not include interpretation of cardiac or coronary
anatomy or pathology. The coronary calcium score interpretation by
the cardiologist is attached.
FINDINGS: Vascular: Normal heart size. Trace pericardial effusion. Normal
caliber thoracic aorta with calcific atherosclerotic disease.

Mediastinum/Nodes: Patulous esophagus. No pathologically enlarged
lymph nodes seen in the chest.

Lungs/Pleura: Central airways are patent. Mild bibasilar
atelectasis. No consolidation, pleural effusion or pneumothorax.

Upper Abdomen: No acute abnormality.

Musculoskeletal: No chest wall mass or suspicious bone lesions
identified.
IMPRESSION: 1. No acute extracardiac abnormalities.
2.  Aortic Atherosclerosis ([JN]-[JN]).

## 2021-10-21 ENCOUNTER — Encounter: Payer: Self-pay | Admitting: Cardiovascular Disease

## 2021-10-29 DIAGNOSIS — H04123 Dry eye syndrome of bilateral lacrimal glands: Secondary | ICD-10-CM | POA: Diagnosis not present

## 2021-10-29 DIAGNOSIS — Z0000 Encounter for general adult medical examination without abnormal findings: Secondary | ICD-10-CM | POA: Diagnosis not present

## 2021-10-29 DIAGNOSIS — R197 Diarrhea, unspecified: Secondary | ICD-10-CM | POA: Diagnosis not present

## 2021-10-29 DIAGNOSIS — H2513 Age-related nuclear cataract, bilateral: Secondary | ICD-10-CM | POA: Diagnosis not present

## 2021-11-25 DIAGNOSIS — E890 Postprocedural hypothyroidism: Secondary | ICD-10-CM | POA: Diagnosis not present

## 2021-12-02 ENCOUNTER — Encounter: Payer: Self-pay | Admitting: Cardiovascular Disease

## 2021-12-02 ENCOUNTER — Ambulatory Visit (INDEPENDENT_AMBULATORY_CARE_PROVIDER_SITE_OTHER): Payer: Medicare Other | Admitting: Cardiovascular Disease

## 2021-12-02 VITALS — BP 136/70 | HR 58 | Ht 62.0 in | Wt 125.4 lb

## 2021-12-02 DIAGNOSIS — R197 Diarrhea, unspecified: Secondary | ICD-10-CM

## 2021-12-02 DIAGNOSIS — E890 Postprocedural hypothyroidism: Secondary | ICD-10-CM

## 2021-12-02 DIAGNOSIS — E0500 Thyrotoxicosis with diffuse goiter without thyrotoxic crisis or storm: Secondary | ICD-10-CM

## 2021-12-02 DIAGNOSIS — I2584 Coronary atherosclerosis due to calcified coronary lesion: Secondary | ICD-10-CM

## 2021-12-02 DIAGNOSIS — R002 Palpitations: Secondary | ICD-10-CM | POA: Diagnosis not present

## 2021-12-02 DIAGNOSIS — I2510 Atherosclerotic heart disease of native coronary artery without angina pectoris: Secondary | ICD-10-CM

## 2021-12-02 MED ORDER — ASPIRIN 81 MG PO TBEC: 81.0000 mg | DELAYED_RELEASE_TABLET | Freq: Every day | ORAL | 3 refills | Status: DC

## 2021-12-02 NOTE — Progress Notes (Unsigned)
Cardiology Office Note    Date:  12/05/2021   ID:  Sara Carson, DOB Sep 19, 1945, MRN [id number]  PCP:  Lajean Manes, MD  Cardiologist:  Shelva Majestic, MD   3 month F/U cardiology evaluation initially referred by Dr. Felipa Eth for possible pulmonary hypertension.   History of Present Illness:  Sara Carson is a 76 y.o. female who is followed by Dr. Felipa Eth.  I saw her for my initial evaluation on December 14, 2020 and last saw her in May 2023.  She presents for 16-month follow-up evaluation.   Sara Carson was in her usual health until approximately 5 days after she took her second [place] booster.   Approximately 5 days later she started to notice increased heart rate and was diagnosed with hyperthyroidism in late May 2022.  On presentation in addition to her tachycardia, she had weakness, and weight loss. She was seen by Dr. Renato Shin and did not tolerate Tapazole secondary to elevated transaminases nausea and itching.  She ultimately went to [facility] and received radioactive iodine treatment on November 14, 2020.  Following therapy her free T4 has decreased from 4.12 most recently at 1.7.  With her palpitations which initiated in May she was started on atenolol with improvement in her heart rate and she presently continues to take 25 mg twice a day.  June 22, she had an echo Doppler study done at [facility] and the report normal LV cavity size with normal systolic and diastolic function.  Her left atrium was moderately dilated.  Her right ventricle was borderline dilated and there was mild dilatation of her right atrium.  Her mitral valve was structurally normal and there was mild mitral regurgitation.  She was estimated to have possible moderate pulmonary hypertension.  Because of pulmonary hypertension on echocardiographic assessment, she was referred by Dr. Felipa Eth for cardiology evaluation.  When I saw her for initial evaluation with me on December 14, 2020 she felt improved with  reference to her previous sudden development of hyperthyroidism and Graves' disease.  Previous weakness and nausea had resolved.  She was unaware of any significant palpitations, presyncope or syncope.  She denied any chest tightness, PND, or orthopnea.   She has been undergoing follow-up at [facility].  She has continued to be on atenolol 25 mg twice a day.  There has been resolution of her increased heart rate and palpitations.  Her tremors have resolved.  She recently underwent follow-up laboratory on January 25, 2021 which now shows TSH at 26.9.  She has not yet been notified about initiation of any levothyroxine and has a scheduled appointment to [facility] [place] in early December, 2022.  I recommended she undergo a follow-up echo Doppler study to reassess her previous pulmonary hypertension.  The echo evaluation was done on January 14, 2021 shows normal LV function with EF 55 to 60% with normal diastolic parameters.  There was mild aortic sclerosis without stenosis.  RV and PA pressures were normal at 28.6 mm.  I saw her on January 28, 2021 at which time she felt well and denied any chest pain, palpitations or lethargy.  She had gained approximately 5 pounds.  Her ECG showed mild sinus arrhythmia with isolated PAC.  Blood pressure was controlled.  He was wondering about possibly being able to reduce mentally stop her atenolol.  At that time, I recommended slight reduction of atenolol from 25 mg twice a day to 25 mg in the  morning and 12.5 mg daily.  If her heart rate was to increase he was advised to increase her atenolol back to her prior dose.  He was started to develop hypothyroidism on her most recent TSH.  Subsequently, she has been followed at [facility]ocrinology and on March 22, 2021 saw [name], FNP for a post ablative hypothyroidism.  She was told to initiate levothyroxine 88 mcg daily.  Her TSH level had increased to 26.9  but most recently had again been over suppressed at 0.15 associated with increased palpitations.  I last saw her on May 29, 2021 at which time she was experiencing some fatigability and denied any chest pain or palpitations.    She was  hospitalized on July 24, 2021 and discharged in July 26, 2021.  She presented with nausea, dizziness, vertigo and had unwitnessed syncope and a fall.  She underwent extensive imaging including CT of her head, MRI of her head, MRA of her head and neck which all were negative for acute findings.  It was felt that her syncope was due to severe symptomatic vertigo.  She was hypokalemic and had replacement.  An echo Doppler study was done in July 24, 2021 which showed normal LV function with EF 60 to 65% and grade 1 diastolic dysfunction.  There were no wall motion abnormalities.  RV systolic pressure was normal at 19 mm.  I last saw her on Aug 21, 2021.  She continued to be on atenolol 12.5 mg daily and levothyroxine at 75 mg 6 days a week and 37.5 mg 1 day a week.  Her most recent TSH had normalized at 0.713 on July 25, 2021.  During that evaluation, her blood pressure was stable and ECG was normal without ectopy.  She was no longer experiencing palpitations and I suggested slight additional further reduction of a atenolol down to 12.5 mg every other day from daily.  With her documented calcification seen on prior thyroid scan I had suggested she undergo coronary calcium score.  She was not on any lipid-lowering therapy.  Since her last evaluation, she was seen again by [name], NP at [facility].Marland Kitchen  Apparently she had been started on cholestyramine for severe diarrhea.  Subsequently, she noticed some weight gain.  She admits to approximately 6 pound increase in weight.  TSH on November 25, 2021 was elevated now at 43.78 with T4 low at 0.6.  Apparently she was not advised to make any adjustment in her current levothyroxine dose which she has been taking 75 mcg daily 6 days a week  and 37.5 mg 1 day a week.  She continues to be atenolol 12.5 mg every other day.  She takes Celexa daily.  She denies any chest pain.  LDL cholesterol was 87 in May 2023.  She underwent coronary calcium score on October 09, 2021 and was found to have a score of 83.7, representing 57th percentile for age, race and sex matched control.  Calcification was exclusive to the LAD.  She presents for evaluation.  Past Medical History:  Diagnosis Date   Anemia    Depression    Graves disease 10/04/2020   Shingles     Past Surgical History:  Procedure Laterality Date   ABDOMINAL HYSTERECTOMY     TONSILLECTOMY      Current Medications: Outpatient Medications Prior to Visit  Medication Sig Dispense Refill   ascorbic acid (VITAMIN C) 500 MG tablet Take 1 tablet by mouth daily.     atenolol (  TENORMIN) 25 MG tablet Take 12.5 mg by mouth every other  morning. 45 tablet 3   cholestyramine (QUESTRAN) 4 GM/DOSE powder Take by mouth daily in the afternoon.     citalopram (CELEXA) 10 MG tablet Take 10 mg by mouth daily.     cycloSPORINE (RESTASIS) 0.05 % ophthalmic emulsion Place 1 drop into both eyes 2 (two) times daily.     ferrous gluconate (FERGON) 240 (27 FE) MG tablet Take 240 mg by mouth daily in the afternoon.     levothyroxine (SYNTHROID) 75 MCG tablet Take 37.5-75 mcg by mouth See admin instructions. Take 75 mcg by mouth in the morning before breakfast SIX days a week and 37.5 mcg ONE day a week     selenium 200 MCG TABS tablet Take 200 mcg by mouth daily in the afternoon.     No facility-administered medications prior to visit.     Allergies:   Levothyroxine, Methimazole, Codeine, Levaquin [levofloxacin in d5w], Levofloxacin, and Amoxicillin   Social History   Socioeconomic History   Marital status: Widowed    Spouse name: Not on file   Number of children: Not on file   Years of education: Not on file   Highest education level: Not on file  Occupational History   Not on file  Tobacco Use    Smoking status: Never   Smokeless tobacco: Never  Vaping Use   Vaping Use: Never used  Substance and Sexual Activity   Alcohol use: Not Currently    Alcohol/week: 3.0 standard drinks of alcohol    Types: 3 Glasses of wine per week   Drug use: Never   Sexual activity: Not on file  Other Topics Concern   Not on file  Social History Narrative   Not on file   Social Determinants of Health   Financial Resource Strain: Not on file  Food Insecurity: Not on file  Transportation Needs: Not on file  Physical Activity: Not on file  Stress: Not on file  Social Connections: Not on file    Socially, she is widowed for 5 years.  She does not have children.  She lives alone.  She had worked as a Holiday representative and hospice.  She graduated from [employer] and attained a Production designer, theatre/television/film at [employer].  There is no tobacco history.  She drinks rare to an occasional glass of wine.  She works out regularly 4 days/week and has a Physiological scientist doing strength training and aerobic exercise for 30 to 60 minutes.  Family History:  The patient's family history includes Cancer in her mother; Diabetes in her father; Hyperlipidemia in her father; Hypertension in her father.  Her mother died at age 18 and had metastatic breast cancer.  Her father died at age 21 and had diabetes and gangrene.  She has 2 sisters ages 71 and 52.  ROS General: Negative; No fevers, chills, or night sweats;  HEENT: Negative; No changes in vision or hearing, sinus congestion, difficulty swallowing Pulmonary: Negative; No cough, wheezing, shortness of breath, hemoptysis Cardiovascular: see HPI GI: Negative; No nausea, vomiting, diarrhea, or abdominal pain GU: Negative; No dysuria, hematuria, or difficulty voiding Musculoskeletal: Negative; no myalgias, joint pain, or weakness Hematologic/Oncology: Prior history of iron deficiency Endocrine: Sudden development of hyperthyroidism May 2022; most recent TSH has increased to  43.78 on November 25, 2021 with low T40.6. Neuro: Recent vertigo Skin: Negative; No rashes or skin lesions Psychiatric: Negative; No behavioral problems, depression Sleep: Negative; No snoring, daytime sleepiness,  hypersomnolence, bruxism, restless legs, hypnogognic hallucinations, no cataplexy Other comprehensive 14 point system review is negative.   PHYSICAL EXAM:   VS:  BP 136/70 (BP Location: Left Arm, Patient Position: Sitting, Cuff Size: Normal)   Pulse (!) 58   Ht $R'5\' 2"'Ss$  (1.575 m)   Wt 125 lb 6.4 oz (56.9 kg)   SpO2 95%   BMI 22.94 kg/m     Repeat blood pressure by me was 120/70  Wt Readings from Last 3 Encounters:  12/02/21 125 lb 6.4 oz (56.9 kg)  08/21/21 123 lb 3.2 oz (55.9 kg)  07/26/21 123 lb 7.3 oz (56 kg)     General: Alert, oriented, no distress.  Skin: normal turgor, no rashes, warm and dry HEENT: Normocephalic, atraumatic. Pupils equal round and reactive to light; sclera anicteric; extraocular muscles intact;  Nose without nasal septal hypertrophy Mouth/Parynx benign; Mallinpatti scale 3 Neck: No JVD, no carotid bruits; normal carotid upstroke Lungs: clear to ausculatation and percussion; no wheezing or rales Chest wall: without tenderness to palpitation Heart: PMI not displaced, RRR, s1 s2 normal, 1/6 systolic murmur, no diastolic murmur, no rubs, gallops, thrills, or heaves Abdomen: soft, nontender; no hepatosplenomehaly, BS+; abdominal aorta nontender and not dilated by palpation. Back: no CVA tenderness Pulses 2+ Musculoskeletal: full range of motion, normal strength, no joint deformities Extremities: no clubbing cyanosis or edema, Homan's sign negative  Neurologic: grossly nonfocal; Cranial nerves grossly wnl Psychologic: Normal mood and affect   Studies/Labs Reviewed:   December 02, 2021 ECG (independently read by me): Sinus bradycardia at 58 with mild sinus arythmia  Aug 21, 2021 ECG (independently read by me): NSR at 64, no ectopy, normal  intervals  May 29, 2021 ECG (independently read by me): Sinus bradycardia at 57, PR 110 msec, no ectopy  January 28, 2021 ECG (independently read by me): Normal sinus rhythm at 60 bpm with isolated PAC and mild sinus arrhythmia.  QTc interval 434 ms.  PR interval today is 122 ms.  There is no evidence for preexcitation.   A second ECG was also done which showed sinus bradycardia at 57 bpm with mild sinus arrhythmia without any PAC;  PR interval at 118 ms.  December 14, 2020 ECG (independently read by me):  Sinus rhythm at 75; short PR interval 92 msec; PACs;QTc 437 msec;   I personally reviewed her ECG from October 12, 2020 which showed sinus arrhythmia with PACs with short PR interval.  There is no evidence for preexcitation.  Recent Labs:    Latest Ref Rng & Units 09/03/2021    8:08 AM 07/26/2021    5:31 AM 07/25/2021    5:00 AM  BMP  Glucose 70 - 99 mg/dL 85  96  86   BUN 8 - 27 mg/dL $Remove'12  17  13   'QXpuklC$ Creatinine 0.57 - 1.00 mg/dL 0.62  0.53  0.52   BUN/Creat Ratio 12 - 28 19     Sodium 134 - 144 mmol/L 133  135  132   Potassium 3.5 - 5.2 mmol/L 4.8  3.9  3.2   Chloride 96 - 106 mmol/L 96  103  100   CO2 20 - 29 mmol/L $RemoveB'24  26  22   'CaozyegW$ Calcium 8.7 - 10.3 mg/dL 9.4  9.1  8.8         Latest Ref Rng & Units 09/03/2021    8:08 AM 07/25/2021    5:00 AM 07/24/2021    4:42 PM  Hepatic Function  Total Protein 6.0 -  8.5 g/dL 6.5  6.5  7.3   Albumin 3.7 - 4.7 g/dL 4.3  3.7  4.2   AST 0 - 40 IU/L $Remov'29  29  31   'egDtof$ ALT 0 - 32 IU/L $Remov'10  12  14   'mTDEKh$ Alk Phosphatase 44 - 121 IU/L 76  61  70   Total Bilirubin 0.0 - 1.2 mg/dL 0.4  0.6  0.6        Latest Ref Rng & Units 07/25/2021    5:00 AM 07/24/2021    5:26 PM 07/24/2021    4:42 PM  CBC  WBC 4.0 - 10.5 K/uL 6.2   8.0   Hemoglobin 12.0 - 15.0 g/dL 11.6  12.9  12.5   Hematocrit 36.0 - 46.0 % 33.4  38.0  36.2   Platelets 150 - 400 K/uL 306   330    Lab Results  Component Value Date   MCV 93.8 07/25/2021   MCV 92.3 07/24/2021   MCV 91.4 10/12/2020   Lab  Results  Component Value Date   TSH 0.713 07/25/2021   No results found for: "HGBA1C"   BNP No results found for: "BNP"  ProBNP No results found for: "PROBNP"   Lipid Panel     Component Value Date/Time   CHOL 205 (H) 09/03/2021 0808   TRIG 46 09/03/2021 0808   HDL 109 09/03/2021 0808   CHOLHDL 1.9 09/03/2021 0808   LDLCALC 87 09/03/2021 0808   LABVLDL 9 09/03/2021 0808     RADIOLOGY: No results found.   Additional studies/ records that were reviewed today include:  I reviewed the records of Dr. Felipa Eth at [place].  I reviewed the records of Dr. Loanne Drilling and ER evaluation.  ECHO: 12/14/2020 IMPRESSIONS   1. Left ventricular ejection fraction, by estimation, is 55 to 60%. Left  ventricular ejection fraction by 3D volume is 56 %. The left ventricle has  normal function. The left ventricle has no regional wall motion  abnormalities. Left ventricular diastolic   parameters were normal. The average left ventricular global longitudinal  strain is -21.4 %. The global longitudinal strain is normal.   2. Right ventricular systolic function is normal. The right ventricular  size is normal. There is normal pulmonary artery systolic pressure. The  estimated right ventricular systolic pressure is 40.9 mmHg.   3. The mitral valve is normal in structure. Trivial mitral valve  regurgitation. No evidence of mitral stenosis.   4. The aortic valve is tricuspid. Aortic valve regurgitation is not  visualized. Mild aortic valve sclerosis is present, with no evidence of  aortic valve stenosis.   5. The inferior vena cava is normal in size with greater than 50%  respiratory variability, suggesting right atrial pressure of 3 mmHg.   ECHO: 07/24/2021  1. Left ventricular ejection fraction, by estimation, is 60 to 65%. Left  ventricular ejection fraction by 2D MOD biplane is 61.5 %. The left  ventricle has normal function. The left ventricle has no regional wall  motion abnormalities. Left  ventricular  diastolic parameters are consistent with Grade I diastolic dysfunction  (impaired relaxation).   2. Right ventricular systolic function is normal. The right ventricular  size is normal. There is normal pulmonary artery systolic pressure. The  estimated right ventricular systolic pressure is 81.1 mmHg.   3. The mitral valve is abnormal. Trivial mitral valve regurgitation.   4. The aortic valve is tricuspid. Aortic valve regurgitation is not  visualized. No aortic stenosis is present.   5. The  inferior vena cava is normal in size with greater than 50%  respiratory variability, suggesting right atrial pressure of 3 mmHg.   ASSESSMENT:    1. Graves disease: s/p radioactive iodine November 14, 2020   2. Postablative hypothyroidism   3. Palpitations   4. Coronary artery calcification   5. Diarrhea, unspecified type      PLAN:  Sara Carson is a very pleasant 18 -year-old female who developed sudden onset of hyperthyroidism/Graves' disease in May 2022 which she temporally relates to approximately 5 days after getting her second [place] booster shot.  At initial presentation she began to notice significant increase in heart rate as weakness.  She was started on methimazole on September 19, 2020.  She subsequently presented to [facility] ER at [facility] on October 12, 2020 with generalized weakness and weight loss of 15 pounds over the prior month.   She developed diffuse itching of her skin and it was felt that this may have been related to her methimazole.  She subsequently underwent treatment at [facility] and received radioactive iodine therapy on November 14, 2020.  Her free T4 levels had slowly decreased  to1.7.  On her thyroid scan she was noted to have mild coronary calcification early in June she had an echo Doppler study at [facility] which was interpreted by Dr. Pamelia Hoit.  She was found to have normal systolic and diastolic function with EF at 62%.  There was moderate left atrial dilatation as  well as mild RA dilatation.  RV was felt to be borderline dilated.  Her mitral valve is structurally normal although there was mild MR.  She was estimated to have possible moderate pulmonary hypertension.  She was started on atenolol 25 mg twice a day with significant improvement in her palpitations and reduction in her heart rate.  I recommended she undergo follow-up echo Doppler study for reassessment after treatment of her Graves' disease for further evaluation of her previously detected pulmonary hypertension which was done on January 14, 2021 .  Her echo from July 24, 2021 showed normal systolic and diastolic function with EF at 65 to 60%; there was no evidence for any atrial enlargement.  Pulmonary artery systolic and right ventricular systolic pressure was normal at 28.6.  There was evidence for mild aortic sclerosis without stenosis.  There was trivial mitral regurgitation.  A a prior office visit I suggested slight reduction of atenolol to 25 mg in the morning and 12.5 mg at night.  She subsequently developed increased TSH following her ablation and was started on levothyroxine at initial dose of 88 mcg at [facility] endocr[facility]ology.  At her evaluation in February 2023 TSH was again over suppressed.  Presently, she is now on levothyroxine 75 mg 6 days a week and 37.5 mg daily.  When seen in May 2023, her most recent TSH was in the normal range at 0.713.  Her blood pressure was stable.  She was no longer experiencing palpitations and I suggested slight additional further reduction of atenolol down to 12.5 mg every other day.  Apparently, she recently has had issues with severe diarrhea and was started on cholestyramine.  She was seen by Jacolyn Reedy, NP at [facility] and laboratory from November 25, 2021 now showed significant TSH elevation at 43.78 and T4 was low at 0.6.  She has continued to be on her same regimen of levothyroxine 75 mg 6 days a week and 37.5 mg daily.  Although I did not want to  make any significant major change I did suggest that she may should at least contact Jacolyn Reedy to see if in fact she should at least increase her levothyroxine to 75 mg daily with plans for her TSH which is scheduled to be rechecked in 1 month.  I discussed potential hypothyroidism also further contributing to hyperlipidemia.  Her most recent LDL cholesterol was 87.  I reviewed her coronary calcium score which showed a score of 83.7 and calcification was entirely in the LAD territory.  I discussed recommended the addition of aspirin 81 mg.  I also discussed a trial of Zetia 10 mg but at present she does not wish to initiate any therapy.  She does not want to take any statin.  As result I have recommended that in 3 months we repeat laboratory with a comprehensive metabolic panel, TSH, CBC fasting lipid panel and LP(a) and I will see her in 4 months for reevaluation.   Medication Adjustments/Labs and Tests Ordered: Current medicines are reviewed at length with the patient today.  Concerns regarding medicines are outlined above.  Medication changes, Labs and Tests ordered today are listed in the Patient Instructions below. Patient Instructions  Medication Instructions:  START aspirin 81 mg daily  *If you need a refill on your cardiac medications before your next appointment, please call your pharmacy*   Lab Work: Please return for FASTING labs in 3 months (CMET, CBC, Lipid, TSH, LP(a))  Our in office lab hours are Monday-Friday 8:00-4:00, closed for lunch 12:45-1:45 pm.  No appointment needed.  [facility] locations:   [place] (Dr. Evette Georges office) - [place] ([facility]) - [address] [place] [address] - [address] [place] - [address] ([facility])  Follow-Up: At [facility], you and your health needs are our priority.  As part of our continuing mission to provide you with exceptional heart care, we have created designated Provider Care Teams.  These Care Teams include your primary Cardiologist (physician) and Advanced Practice Providers (APPs -  Physician Assistants and Nurse Practitioners) who all work together to provide you with the care you need, when you need it.  We recommend signing up for the patient portal called "MyChart".  Sign up information is provided on this After Visit Summary.  MyChart is used to connect with patients for Virtual Visits (Telemedicine).  Patients are able to view lab/test results, encounter notes, upcoming appointments, etc.  Non-urgent messages can be sent to your provider as well.   To learn more about what you can do with MyChart, go to [email or web address].    Your next appointment:   4 month(s)  The format for your next appointment:   In Person  Provider:   Shelva Majestic, MD {    Important Information About Sugar         Signed, Shelva Majestic, MD  12/05/2021 3:53 PM    [place] [address] [place], [place], [place]  [address] Phone: [phone]

## 2021-12-02 NOTE — Patient Instructions (Addendum)
Medication Instructions:  START aspirin 81 mg daily  *If you need a refill on your cardiac medications before your next appointment, please call your pharmacy*   Lab Work: Please return for FASTING labs in 3 months (CMET, CBC, Lipid, TSH, LP(a))  Our in office lab hours are Monday-Friday 8:00-4:00, closed for lunch 12:45-1:45 pm.  No appointment needed.  [facility] locations:   [place] (Dr. Evette Georges office) - [place] ([facility]) - [address] [place] [address] - [address] [place] - [address] ([facility])  Follow-Up: At [facility], you and your health needs are our priority.  As part of our continuing mission to provide you with exceptional heart care, we have created designated Provider Care Teams.  These Care Teams include your primary Cardiologist (physician) and Advanced Practice Providers (APPs -  Physician Assistants and Nurse Practitioners) who all work together to provide you with the care you need, when you need it.  We recommend signing up for the patient portal called "MyChart".  Sign up information is provided on this After Visit Summary.  MyChart is used to connect with patients for Virtual Visits (Telemedicine).  Patients are able to view lab/test results, encounter notes, upcoming appointments, etc.  Non-urgent messages can be sent to your provider as well.   To learn more about what you can do with MyChart, go to [email or web address].    Your next appointment:   4 month(s)  The format for your next appointment:   In Person  Provider:   Shelva Majestic, MD {    Important Information About [employer]

## 2021-12-03 ENCOUNTER — Encounter: Payer: Self-pay | Admitting: Cardiovascular Disease

## 2021-12-05 ENCOUNTER — Encounter: Payer: Self-pay | Admitting: Cardiovascular Disease

## 2021-12-30 DIAGNOSIS — E890 Postprocedural hypothyroidism: Secondary | ICD-10-CM | POA: Diagnosis not present

## 2022-01-09 DIAGNOSIS — Z23 Encounter for immunization: Secondary | ICD-10-CM | POA: Diagnosis not present

## 2022-01-22 DIAGNOSIS — Z23 Encounter for immunization: Secondary | ICD-10-CM | POA: Diagnosis not present

## 2022-01-23 DIAGNOSIS — H04123 Dry eye syndrome of bilateral lacrimal glands: Secondary | ICD-10-CM | POA: Diagnosis not present

## 2022-01-27 DIAGNOSIS — E890 Postprocedural hypothyroidism: Secondary | ICD-10-CM | POA: Diagnosis not present

## 2022-02-13 DIAGNOSIS — H04123 Dry eye syndrome of bilateral lacrimal glands: Secondary | ICD-10-CM | POA: Diagnosis not present

## 2022-02-24 DIAGNOSIS — E890 Postprocedural hypothyroidism: Secondary | ICD-10-CM | POA: Diagnosis not present

## 2022-03-04 DIAGNOSIS — E0500 Thyrotoxicosis with diffuse goiter without thyrotoxic crisis or storm: Secondary | ICD-10-CM | POA: Diagnosis not present

## 2022-03-04 DIAGNOSIS — I2584 Coronary atherosclerosis due to calcified coronary lesion: Secondary | ICD-10-CM | POA: Diagnosis not present

## 2022-03-04 DIAGNOSIS — E890 Postprocedural hypothyroidism: Secondary | ICD-10-CM | POA: Diagnosis not present

## 2022-03-04 DIAGNOSIS — I2510 Atherosclerotic heart disease of native coronary artery without angina pectoris: Secondary | ICD-10-CM | POA: Diagnosis not present

## 2022-03-04 DIAGNOSIS — R002 Palpitations: Secondary | ICD-10-CM | POA: Diagnosis not present

## 2022-03-05 LAB — LIPID PANEL
Chol/HDL Ratio: 1.9 ratio (ref 0.0–4.4)
Cholesterol, Total: 208 mg/dL — ABNORMAL HIGH (ref 100–199)
HDL: 110 mg/dL (ref >39)
LDL Chol Calc (NIH): 89 mg/dL (ref 0–99)
Triglycerides: 46 mg/dL (ref 0–149)
VLDL Cholesterol Cal: 9 mg/dL (ref 5–40)

## 2022-03-05 LAB — COMPREHENSIVE METABOLIC PANEL
ALT: 5 IU/L (ref 0–32)
AST: 19 IU/L (ref 0–40)
Albumin/Globulin Ratio: 1.9 (ref 1.2–2.2)
Albumin: 4.3 g/dL (ref 3.8–4.8)
Alkaline Phosphatase: 69 IU/L (ref 44–121)
BUN/Creatinine Ratio: 23 (ref 12–28)
BUN: 13 mg/dL (ref 8–27)
Bilirubin Total: 0.5 mg/dL (ref 0.0–1.2)
CO2: 26 mmol/L (ref 20–29)
Calcium: 9.4 mg/dL (ref 8.7–10.3)
Chloride: 96 mmol/L (ref 96–106)
Creatinine, Ser: 0.57 mg/dL (ref 0.57–1.00)
Globulin, Total: 2.3 g/dL (ref 1.5–4.5)
Glucose: 91 mg/dL (ref 70–99)
Potassium: 4.7 mmol/L (ref 3.5–5.2)
Sodium: 133 mmol/L — ABNORMAL LOW (ref 134–144)
Total Protein: 6.6 g/dL (ref 6.0–8.5)
eGFR: 95 mL/min/{1.73_m2} (ref >59)

## 2022-03-05 LAB — CBC
Hematocrit: 33.6 % — ABNORMAL LOW (ref 34.0–46.6)
Hemoglobin: 11.7 g/dL (ref 11.1–15.9)
MCH: 32.3 pg (ref 26.6–33.0)
MCHC: 34.8 g/dL (ref 31.5–35.7)
MCV: 93 fL (ref 79–97)
Platelets: 340 10*3/uL (ref 150–450)
RBC: 3.62 x10E6/uL — ABNORMAL LOW (ref 3.77–5.28)
RDW: 12.1 % (ref 11.7–15.4)
WBC: 4.6 10*3/uL (ref 3.4–10.8)

## 2022-03-05 LAB — TSH: TSH: 4.11 u[IU]/mL (ref 0.450–4.500)

## 2022-03-05 LAB — LIPOPROTEIN A (LPA): Lipoprotein (a): 45.6 nmol/L (ref <75.0)

## 2022-04-01 DIAGNOSIS — Z79890 Hormone replacement therapy (postmenopausal): Secondary | ICD-10-CM | POA: Diagnosis not present

## 2022-04-01 DIAGNOSIS — Z923 Personal history of irradiation: Secondary | ICD-10-CM | POA: Diagnosis not present

## 2022-04-01 DIAGNOSIS — E890 Postprocedural hypothyroidism: Secondary | ICD-10-CM | POA: Diagnosis not present

## 2022-04-03 DIAGNOSIS — L821 Other seborrheic keratosis: Secondary | ICD-10-CM | POA: Diagnosis not present

## 2022-04-03 DIAGNOSIS — L812 Freckles: Secondary | ICD-10-CM | POA: Diagnosis not present

## 2022-04-03 DIAGNOSIS — D225 Melanocytic nevi of trunk: Secondary | ICD-10-CM | POA: Diagnosis not present

## 2022-04-03 DIAGNOSIS — D2371 Other benign neoplasm of skin of right lower limb, including hip: Secondary | ICD-10-CM | POA: Diagnosis not present

## 2022-04-03 DIAGNOSIS — D2372 Other benign neoplasm of skin of left lower limb, including hip: Secondary | ICD-10-CM | POA: Diagnosis not present

## 2022-04-03 DIAGNOSIS — D224 Melanocytic nevi of scalp and neck: Secondary | ICD-10-CM | POA: Diagnosis not present

## 2022-04-03 DIAGNOSIS — L308 Other specified dermatitis: Secondary | ICD-10-CM | POA: Diagnosis not present

## 2022-04-03 DIAGNOSIS — L720 Epidermal cyst: Secondary | ICD-10-CM | POA: Diagnosis not present

## 2022-04-07 ENCOUNTER — Other Ambulatory Visit: Payer: Self-pay

## 2022-04-07 ENCOUNTER — Ambulatory Visit: Payer: Medicare Other | Attending: Cardiovascular Disease | Admitting: Cardiovascular Disease

## 2022-04-07 ENCOUNTER — Encounter: Payer: Self-pay | Admitting: Cardiovascular Disease

## 2022-04-07 VITALS — BP 148/70 | HR 60 | Ht 62.0 in | Wt 120.8 lb

## 2022-04-07 DIAGNOSIS — E0500 Thyrotoxicosis with diffuse goiter without thyrotoxic crisis or storm: Secondary | ICD-10-CM | POA: Diagnosis not present

## 2022-04-07 DIAGNOSIS — I2584 Coronary atherosclerosis due to calcified coronary lesion: Secondary | ICD-10-CM | POA: Diagnosis not present

## 2022-04-07 DIAGNOSIS — E7800 Pure hypercholesterolemia, unspecified: Secondary | ICD-10-CM | POA: Diagnosis not present

## 2022-04-07 DIAGNOSIS — I2510 Atherosclerotic heart disease of native coronary artery without angina pectoris: Secondary | ICD-10-CM

## 2022-04-07 DIAGNOSIS — Z79899 Other long term (current) drug therapy: Secondary | ICD-10-CM

## 2022-04-07 DIAGNOSIS — E890 Postprocedural hypothyroidism: Secondary | ICD-10-CM

## 2022-04-07 DIAGNOSIS — E785 Hyperlipidemia, unspecified: Secondary | ICD-10-CM

## 2022-04-07 MED ORDER — ROSUVASTATIN CALCIUM 10 MG PO TABS: 10.0000 mg | ORAL_TABLET | Freq: Every day | ORAL | 3 refills | Status: DC

## 2022-04-07 NOTE — Progress Notes (Signed)
Cardiology Office Note    Date:  04/07/2022   ID:  Sara Carson, DOB 1945/10/26, MRN [id number]  PCP:  Sara Manes, MD  Cardiologist:  Sara Majestic, MD   4 month F/U cardiology evaluation initially referred by Dr. Felipa Carson for possible pulmonary hypertension.   History of Present Illness:  Sara Carson is a 76 y.o. female who is followed by Dr. Felipa Carson.  I saw her for my initial evaluation on December 14, 2020 and last saw her in August 2023.  She presents for 75-monthfollow-up evaluation.   Ms. SCahuewas in her usual health until approximately 5 days after she took her second [place].   Approximately 5 days later she started to notice increased heart rate and was diagnosed with hyperthyroidism in late May 2022.  On presentation in addition to her tachycardia, she had weakness, and weight loss. She was seen by Dr. SRenato Shinand did not tolerate Tapazole secondary to elevated transaminases nausea and itching.  [facility].  Following therapy her free T4 has decreased from 4.12 most recently at 1.7.  With her palpitations which initiated in May she was started on atenolol with improvement in her heart rate and she presently continues to take 25 mg twice a day.  [place] normal [place] cavity size with normal systolic and diastolic function.  Her left atrium was moderately dilated.  Her right ventricle was borderline dilated and there was mild dilatation of her right atrium.  Her mitral valve was structurally normal and there was mild mitral regurgitation.  She was estimated to have possible moderate pulmonary hypertension.  Because of pulmonary hypertension on echocardiographic assessment, she was referred by Dr. SFelipa Ethfor cardiology evaluation.  When I saw her for initial evaluation with me on December 14, 2020 she felt improved  with reference to her previous sudden development of hyperthyroidism and Graves' disease.  Previous weakness and nausea had resolved.  She was unaware of any significant palpitations, presyncope or syncope.  She denied any chest tightness, PND, or orthopnea.   [facility]  She has continued to be on atenolol 25 mg twice a day.  There has been resolution of her increased heart rate and palpitations.  Her tremors have resolved.  She recently underwent follow-up laboratory on January 25, 2021 which now shows TSH at 26.9.  She has not yet been notified about initiation of any levothyroxine and has a scheduled appointment [place] [facility] [place] [facility] [place] December, 2022.  I recommended she undergo a follow-up echo Doppler study to reassess her previous pulmonary hypertension.  The echo evaluation was done on January 14, 2021 shows normal LV function with EF 55 to 60% with normal diastolic parameters.  There was mild aortic sclerosis without stenosis.  RV and PA pressures were normal at 28.6 mm.  I saw her on January 28, 2021 at which time she felt well and denied any chest pain, palpitations or lethargy.  She had gained approximately 5 pounds.  Her ECG showed mild sinus arrhythmia with isolated PAC.  Blood pressure was controlled.  He was wondering about possibly being able to reduce mentally stop her atenolol.  At that time, I recommended slight reduction of atenolol from 25 mg twice a day to 25 mg in the  morning and 12.5 mg daily.  If her heart rate was to increase he was advised to increase her atenolol back to her prior dose.  He was started to develop hypothyroidism on her most recent TSH.  Subsequently, she has been followed at [facility] end[facility]ology and on March 22, 2021 saw [name], FNP for a post ablative hypothyroidism.  She was told to initiate levothyroxine 88 mcg daily.  Her TSH level had increased to  26.9 but most recently had again been over suppressed at 0.15 associated with increased palpitations.  I last saw her on 75, 2023 at which time she was experiencing some fatigability and denied any chest pain or palpitations.    She was  hospitalized on July 24, 2021 and discharged in July 26, 2021.  She presented with nausea, dizziness, vertigo and had unwitnessed syncope and a fall.  She underwent extensive imaging including CT of her head, MRI of her head, MRA of her head and neck which all were negative for acute findings.  It was felt that her syncope was due to severe symptomatic vertigo.  She was hypokalemic and had replacement.  An echo Doppler study was done in July 24, 2021 which showed normal LV function with EF 60 to 65% and grade 1 diastolic dysfunction.  There were no wall motion abnormalities.  RV systolic pressure was normal at 19 mm.  I saw her on 75, 2023.  She continued to be on atenolol 12.5 mg daily and levothyroxine at 75 mg 6 days a week and 37.5 mg 1 day a week.  Her most recent TSH had normalized at 0.713 on July 25, 2021.  During that evaluation, her blood pressure was stable and ECG was normal without ectopy.  She was no longer experiencing palpitations and I suggested slight additional further reduction of a atenolol down to 12.5 mg every other day from daily.  With her documented calcification seen on prior thyroid scan I had suggested she undergo coronary calcium score.  She was not on any lipid-lowering therapy.  I last saw her on 75, 2023. Since her prior evaluation, she was seen again by [name], NP at [facility].Marland Kitchen  Apparently she had been started on cholestyramine for severe diarrhea.  Subsequently, she noticed some weight gain.  She admits to approximately 6 pound increase in weight.  TSH on November 25, 2021 was elevated now at 43.78 with T4 low at 0.6.  Apparently she was not advised to make any adjustment in her current levothyroxine dose which she has been  taking 75 mcg daily 6 days a week and 37.5 mg 1 day a week.  She continues to be atenolol 12.5 mg every other day.  She takes Celexa daily.  She denies any chest pain.  LDL cholesterol was 87 in May 2023.  She underwent coronary calcium score on October 09, 2021 and was found to have a score of 83.7, representing 57th percentile for age, race and sex matched control.  Calcification was exclusive to the LAD.  At that time I discussed initiation of statin therapy with target LDL less than 70.  She preferred dietary adjustment initially with recheck in several months.  Since I saw her, her thyroid abnormalities have normalized.  She feels well.  She denies chest pain or shortness of breath.  Most recent TSH was six 3.71 on April 01, 2022.  She is on atenolol 12.5 mg every other morning, aspirin 81 mg, and is now on  levothyroxine at 75 mcg daily.  She continues to be on ferrous gluconate.  She takes citalopram.  She has continued to take cholestyramine for her prior diarrhea.  She underwent follow-up laboratory with improved diet and LDL cholesterol was not significantly changed and was 89.  LP(a) was normal at 45 on March 04, 2022.  She presents for follow-up evaluation.  Past Medical History:  Diagnosis Date   Anemia    Depression    Graves disease 10/04/2020   Shingles     Past Surgical History:  Procedure Laterality Date   ABDOMINAL HYSTERECTOMY     TONSILLECTOMY      Current Medications: Outpatient Medications Prior to Visit  Medication Sig Dispense Refill   ascorbic acid (VITAMIN C) 500 MG tablet Take 1 tablet by mouth daily.     aspirin EC 81 MG tablet Take 1 tablet (81 mg total) by mouth daily. Swallow whole. 90 tablet 3   atenolol (TENORMIN) 25 MG tablet Take 12.5 mg by mouth every other  morning. 45 tablet 3   cholestyramine (QUESTRAN) 4 GM/DOSE powder Take by mouth daily in the afternoon.     citalopram (CELEXA) 10 MG tablet Take 10 mg by mouth daily.     ferrous gluconate  (FERGON) 240 (27 FE) MG tablet Take 240 mg by mouth daily in the afternoon.     levothyroxine (SYNTHROID) 75 MCG tablet Take 75 mcg by mouth See admin instructions.     selenium 200 MCG TABS tablet Take 200 mcg by mouth daily in the afternoon.     Varenicline Tartrate 0.03 MG/ACT SOLN Administer 1 spray in each nostril 2 times daily.     cycloSPORINE (RESTASIS) 0.05 % ophthalmic emulsion Place 1 drop into both eyes 2 (two) times daily. (Patient not taking: Reported on 04/07/2022)     No facility-administered medications prior to visit.     Allergies:   Levothyroxine, Methimazole, Codeine, Levaquin [levofloxacin in d5w], Levofloxacin, and Amoxicillin   Social History   Socioeconomic History   Marital status: Widowed    Spouse name: Not on file   Number of children: Not on file   Years of education: Not on file   Highest education level: Not on file  Occupational History   Not on file  Tobacco Use   Smoking status: Never   Smokeless tobacco: Never  Vaping Use   Vaping Use: Never used  Substance and Sexual Activity   Alcohol use: Not Currently    Alcohol/week: 3.0 standard drinks of alcohol    Types: 3 Glasses of wine per week   Drug use: Never   Sexual activity: Not on file  Other Topics Concern   Not on file  Social History Narrative   Not on file   Social Determinants of Health   Financial Resource Strain: Not on file  Food Insecurity: Not on file  Transportation Needs: Not on file  Physical Activity: Not on file  Stress: Not on file  Social Connections: Not on file    Socially, she is widowed for 5 years.  She does not have children.  She lives alone.  She had worked as a Holiday representative and hospice.  She graduated from [employer] and attained a Production designer, theatre/television/film at [employer].  There is no tobacco history.  She drinks rare to an occasional glass of wine.  She works out regularly 4 days/week and has a Physiological scientist doing strength training and aerobic exercise for  30 to 60 minutes.  Family History:  The patient's family history includes Cancer in her mother; Diabetes in her father; Hyperlipidemia in her father; Hypertension in her father.  Her mother died at age 30 and had metastatic breast cancer.  Her father died at age 57 and had diabetes and gangrene.  She has 2 sisters ages 34 and 71.  ROS General: Negative; No fevers, chills, or night sweats;  HEENT: Negative; No changes in vision or hearing, sinus congestion, difficulty swallowing Pulmonary: Negative; No cough, wheezing, shortness of breath, hemoptysis Cardiovascular: see HPI GI: Negative; No nausea, vomiting, diarrhea, or abdominal pain GU: Negative; No dysuria, hematuria, or difficulty voiding Musculoskeletal: Negative; no myalgias, joint pain, or weakness Hematologic/Oncology: Prior history of iron deficiency Endocrine: Sudden development of hyperthyroidism May 2022; most recent TSH has increased to 43.78 on November 25, 2021 with low T40.6. Neuro: Recent vertigo Skin: Negative; No rashes or skin lesions Psychiatric: Negative; No behavioral problems, depression Sleep: Negative; No snoring, daytime sleepiness, hypersomnolence, bruxism, restless legs, hypnogognic hallucinations, no cataplexy Other comprehensive 14 point system review is negative.   PHYSICAL EXAM:   VS:  BP (!) 148/70   Pulse 60   Ht _0  (1.575 m)   Wt 120 lb 12.8 oz (54.8 kg)   SpO2 100%   BMI 22.09 kg/m     Repeat blood pressure by me was 138/70  Wt Readings from Last 3 Encounters:  04/07/22 120 lb 12.8 oz (54.8 kg)  12/02/21 125 lb 6.4 oz (56.9 kg)  08/21/21 123 lb 3.2 oz (55.9 kg)    General: Alert, oriented, no distress.  Skin: normal turgor, no rashes, warm and dry HEENT: Normocephalic, atraumatic. Pupils equal round and reactive to light; sclera anicteric; extraocular muscles intact;  Nose without nasal septal hypertrophy Mouth/Parynx benign; Mallinpatti scale 3 Neck: No JVD, no carotid bruits; normal  carotid upstroke Lungs: clear to ausculatation and percussion; no wheezing or rales Chest wall: without tenderness to palpitation Heart: PMI not displaced, RRR, s1 s2 normal, 1/6 systolic murmur, no diastolic murmur, no rubs, gallops, thrills, or heaves Abdomen: soft, nontender; no hepatosplenomehaly, BS+; abdominal aorta nontender and not dilated by palpation. Back: no CVA tenderness Pulses 2+ Musculoskeletal: full range of motion, normal strength, no joint deformities Extremities: no clubbing cyanosis or edema, Homan's sign negative  Neurologic: grossly nonfocal; Cranial nerves grossly wnl Psychologic: Normal mood and affect   Studies/Labs Reviewed:   April 07, 2022 ECG (independently read by me):  Sinus rhythm at 60, [facility]  December 02, 2021 ECG (independently read by me): Sinus bradycardia at 58 with mild sinus arythmia  Aug 21, 2021 ECG (independently read by me): NSR at 64, no ectopy, normal intervals  May 29, 2021 ECG (independently read by me): Sinus bradycardia at 57, PR 110 msec, no ectopy  January 28, 2021 ECG (independently read by me): Normal sinus rhythm at 60 bpm with isolated PAC and mild sinus arrhythmia.  QTc interval 434 ms.  PR interval today is 122 ms.  There is no evidence for preexcitation.   A second ECG was also done which showed sinus bradycardia at 57 bpm with mild sinus arrhythmia without any PAC;  PR interval at 118 ms.  December 14, 2020 ECG (independently read by me):  Sinus rhythm at 75; short PR interval 92 msec; PACs;QTc 437 msec;   I personally reviewed her ECG from October 12, 2020 which showed sinus arrhythmia with PACs with short PR interval.  There is no evidence for preexcitation.  Recent Labs:  Latest Ref Rng & Units 03/04/2022    8:12 AM 09/03/2021    8:08 AM 07/26/2021    5:31 AM  BMP  Glucose 70 - 99 mg/dL 91  85  96   BUN 8 - 27 mg/dL _0 Creatinine 0.57 - 1.00 mg/dL 0.57  0.62  0.53   BUN/Creat Ratio 12 - _1 Sodium  134 - 144 mmol/L 133  133  135   Potassium 3.5 - 5.2 mmol/L 4.7  4.8  3.9   Chloride 96 - 106 mmol/L 96  96  103   CO2 20 - 29 mmol/L _2 Calcium 8.7 - 10.3 mg/dL 9.4  9.4  9.1         Latest Ref Rng & Units 03/04/2022    8:12 AM 09/03/2021    8:08 AM 07/25/2021    5:00 AM  Hepatic Function  Total Protein 6.0 - 8.5 g/dL 6.6  6.5  6.5   Albumin 3.8 - 4.8 g/dL 4.3  4.3  3.7   AST 0 - 40 IU/L _3 ALT 0 - 32 IU/L _4 Alk Phosphatase 44 - 121 IU/L 69  76  61   Total Bilirubin 0.0 - 1.2 mg/dL 0.5  0.4  0.6        Latest Ref Rng & Units 03/04/2022    8:12 AM 07/25/2021    5:00 AM 07/24/2021    5:26 PM  CBC  WBC 3.4 - 10.8 x10E3/uL 4.6  6.2    Hemoglobin 11.1 - 15.9 g/dL 11.7  11.6  12.9   Hematocrit 34.0 - 46.6 % 33.6  33.4  38.0   Platelets 150 - 450 x10E3/uL 340  306     Lab Results  Component Value Date   MCV 93 03/04/2022   MCV 93.8 07/25/2021   MCV 92.3 07/24/2021   Lab Results  Component Value Date   TSH 4.110 03/04/2022   No results found for: "HGBA1C"   BNP No results found for: "BNP"  ProBNP No results found for: "PROBNP"   Lipid Panel     Component Value Date/Time   CHOL 208 (H) 03/04/2022 0812   TRIG 46 03/04/2022 0812   HDL 110 03/04/2022 0812   CHOLHDL 1.9 03/04/2022 0812   LDLCALC 89 03/04/2022 0812   LABVLDL 9 03/04/2022 0812     RADIOLOGY: No results found.   Additional studies/ records that were reviewed today include:  I reviewed the records of Dr. Felipa Carson at [facility].  I reviewed the records of Dr. Loanne Drilling and ER evaluation.  ECHO: 12/14/2020 IMPRESSIONS   1. Left ventricular ejection fraction, by estimation, is 55 to 60%. Left  ventricular ejection fraction by 3D volume is 56 %. The left ventricle has  normal function. The left ventricle has no regional wall motion  abnormalities. Left ventricular diastolic   parameters were normal. The average left ventricular global longitudinal  strain is -21.4 %. The  global longitudinal strain is normal.   2. Right ventricular systolic function is normal. The right ventricular  size is normal. There is normal pulmonary artery systolic pressure. The  estimated right ventricular systolic pressure is 85.6 mmHg.   3. The mitral valve is normal in structure. Trivial mitral valve  regurgitation. No evidence of mitral stenosis.   4. The aortic valve is tricuspid. Aortic valve regurgitation is not  visualized. Mild aortic valve sclerosis is present, with no evidence of  aortic valve stenosis.   5. The inferior vena cava is normal in size with greater than 50%  respiratory variability, suggesting right atrial pressure of 3 mmHg.   ECHO: 07/24/2021  1. Left ventricular ejection fraction, by estimation, is 60 to 65%. Left  ventricular ejection fraction by 2D MOD biplane is 61.5 %. The left  ventricle has normal function. The left ventricle has no regional wall  motion abnormalities. Left ventricular  diastolic parameters are consistent with Grade I diastolic dysfunction  (impaired relaxation).   2. Right ventricular systolic function is normal. The right ventricular  size is normal. There is normal pulmonary artery systolic pressure. The  estimated right ventricular systolic pressure is 39.7 mmHg.   3. The mitral valve is abnormal. Trivial mitral valve regurgitation.   4. The aortic valve is tricuspid. Aortic valve regurgitation is not  visualized. No aortic stenosis is present.   5. The inferior vena cava is normal in size with greater than 50%  respiratory variability, suggesting right atrial pressure of 3 mmHg.   ASSESSMENT:    1. Coronary artery calcification   2. Pure hypercholesterolemia   3. Graves disease: s/p radioactive iodine November 14, 2020   4. Postablative hypothyroidism   5. Medication management     PLAN:  Ms. Andre Gallego is a very pleasant 31 -year-old female who developed sudden onset of hyperthyroidism/Graves' disease in May 2022  which she temporally relates to approximately 5 days after getting her second COVID booster shot.  At initial presentation she began to notice significant increase in heart rate as weakness.  She was started on methimazole on September 19, 2020.  She subsequently presented to [facility] ER at [facility] on October 12, 2020 with generalized weakness and weight loss of 15 pounds over the prior month.   She developed diffuse itching of her skin and it was felt that this may have been related to her methimazole.  She subsequently underwent treatment at [facility] and received radioactive iodine therapy on November 14, 2020.  Her free T4 levels had slowly decreased  to1.7.  On her thyroid scan she was noted to have mild coronary calcification early in June she had an echo Doppler study at [facility] which was interpreted by Dr. Pamelia Hoit.  She was found to have normal systolic and diastolic function with EF at 62%.  There was moderate left atrial dilatation as well as mild RA dilatation.  RV was felt to be borderline dilated.  Her mitral valve is structurally normal although there was mild MR.  She was estimated to have possible moderate pulmonary hypertension.  She was started on atenolol 25 mg twice a day with significant improvement in her palpitations and reduction in her heart rate.  I recommended she undergo follow-up echo Doppler study for reassessment after treatment of her Graves' disease for further evaluation of her previously detected pulmonary hypertension which was done on January 14, 2021 .  Her echo from July 24, 2021 showed normal systolic and diastolic function with EF at 65 to 60%; there was no evidence for any atrial enlargement.  Pulmonary artery systolic and right ventricular systolic pressure was normal at 28.6.  There was evidence for mild aortic sclerosis without stenosis.  There was trivial mitral regurgitation.  A a prior office visit I suggested slight reduction of atenolol to 25 mg in the morning and 12.5 mg  at night.  She subsequently developed increased  TSH following her ablation and was started on levothyroxine at initial dose of 88 mcg at [facility].  At her evaluation in February 2023 TSH was again over suppressed.  Presently, she is now on levothyroxine 75 mg 6 days a week and 37.5 mg daily.  When seen in May 2023, her most recent TSH was in the normal range at 0.713.  Her blood pressure was stable.  She was no longer experiencing palpitations and I suggested slight additional further reduction of atenolol down to 12.5 mg every other day.  Apparently, she recently has had issues with severe diarrhea and was started on cholestyramine.  She was seen by Jacolyn Reedy, NP at [facility] and laboratory from November 25, 2021 now showed significant TSH elevation at 43.78 and T4 was low at 0.6.  She has continued to be on her same regimen of levothyroxine 75 mg 6 days a week and 37.5 mg daily.  Although I did not want to make any significant major change I did suggest that she may should at least contact Jacolyn Reedy to see if in fact she should at least increase her levothyroxine to 75 mg daily with plans for her TSH which is scheduled to be rechecked in 1 month.  I discussed potential hypothyroidism also further contributing to hyperlipidemia.  Her recent LDL cholesterol was 87.  At her last office visit in August 2023 I reviewed her coronary calcium score which was elevated at 83.7 with calcification currently entirely in the LAD.  I discussed initiation of therapy but she wished to defer at least for recheck.  Her most recent LDL cholesterol in November was now 35.  Her LP(a) was normal at 45.6.  Presently, I have elected to add low-dose rosuvastatin at 10 mg.  If she develops some mild aches to this she can reduce this to 5 mg or we can try acetamide 10 mg daily.  Her thyroid is now well controlled on daily levothyroxine 75 mcg.  She continues to watch her diet and exercise.  In 3 months I will  recheck a comprehensive metabolic panel and fasting lipid panel.  I will contact her regarding the results and plan to see her for 6 months for reevaluation or sooner as needed.   Medication Adjustments/Labs and Tests Ordered: Current medicines are reviewed at length with the patient today.  Concerns regarding medicines are outlined above.  Medication changes, Labs and Tests ordered today are listed in the Patient Instructions below. Patient Instructions  Medication Instructions:  Start Rosuvastatin 10 mg daily as directed.   *If you need a refill on your cardiac medications before your next appointment, please call your pharmacy*   Lab Work: Your physician recommends that you return for lab work in 3 months. Fasting Lipid panel & CMP   If you have labs (blood work) drawn today and your tests are completely normal, you will receive your results only by: MyChart Message (if you have MyChart) OR A paper copy in the mail If you have any lab test that is abnormal or we need to change your treatment, we will call you to review the results.   Testing/Procedures: NONE ordered at this time of appointment     Follow-Up: At [facility], you and your health needs are our priority.  As part of our continuing mission to provide you with exceptional heart care, we have created designated Provider Care Teams.  These Care Teams include your primary Cardiologist (physician) and Advanced Practice  Providers (APPs -  Physician Assistants and Nurse Practitioners) who all work together to provide you with the care you need, when you need it.  We recommend signing up for the patient portal called "MyChart".  Sign up information is provided on this After Visit Summary.  MyChart is used to connect with patients for Virtual Visits (Telemedicine).  Patients are able to view lab/test results, encounter notes, upcoming appointments, etc.  Non-urgent messages can be sent to your provider as well.   To  learn more about what you can do with MyChart, go to [email or web address].    Your next appointment:   6 month(s)  The format for your next appointment:   In Person  Provider:   Shelva Majestic, MD     Other Instructions   Important Information About Sugar         Signed, Sara Majestic, MD  04/07/2022 6:24 PM    [place] [place] [address] [place], [place], [place]  [address] Phone: [phone]

## 2022-04-07 NOTE — Patient Instructions (Signed)
Medication Instructions:  Start Rosuvastatin 10 mg daily as directed.   *If you need a refill on your cardiac medications before your next appointment, please call your pharmacy*   Lab Work: Your physician recommends that you return for lab work in 3 months. Fasting Lipid panel & CMP   If you have labs (blood work) drawn today and your tests are completely normal, you will receive your results only by: MyChart Message (if you have MyChart) OR A paper copy in the mail If you have any lab test that is abnormal or we need to change your treatment, we will call you to review the results.   Testing/Procedures: NONE ordered at this time of appointment     Follow-Up: At [facility], you and your health needs are our priority.  As part of our continuing mission to provide you with exceptional heart care, we have created designated Provider Care Teams.  These Care Teams include your primary Cardiologist (physician) and Advanced Practice Providers (APPs -  Physician Assistants and Nurse Practitioners) who all work together to provide you with the care you need, when you need it.  We recommend signing up for the patient portal called "MyChart".  Sign up information is provided on this After Visit Summary.  MyChart is used to connect with patients for Virtual Visits (Telemedicine).  Patients are able to view lab/test results, encounter notes, upcoming appointments, etc.  Non-urgent messages can be sent to your provider as well.   To learn more about what you can do with MyChart, go to [email or web address].    Your next appointment:   6 month(s)  The format for your next appointment:   In Person  Provider:   Shelva Majestic, MD     Other Instructions   Important Information About Sugar

## 2022-04-22 DIAGNOSIS — Z1231 Encounter for screening mammogram for malignant neoplasm of breast: Secondary | ICD-10-CM | POA: Diagnosis not present

## 2022-04-24 DIAGNOSIS — Z23 Encounter for immunization: Secondary | ICD-10-CM | POA: Diagnosis not present

## 2022-06-17 DIAGNOSIS — H04123 Dry eye syndrome of bilateral lacrimal glands: Secondary | ICD-10-CM | POA: Diagnosis not present

## 2022-07-07 DIAGNOSIS — E785 Hyperlipidemia, unspecified: Secondary | ICD-10-CM | POA: Diagnosis not present

## 2022-07-07 DIAGNOSIS — I2584 Coronary atherosclerosis due to calcified coronary lesion: Secondary | ICD-10-CM | POA: Diagnosis not present

## 2022-07-07 DIAGNOSIS — I2510 Atherosclerotic heart disease of native coronary artery without angina pectoris: Secondary | ICD-10-CM | POA: Diagnosis not present

## 2022-07-08 ENCOUNTER — Other Ambulatory Visit: Payer: Self-pay

## 2022-07-08 DIAGNOSIS — E875 Hyperkalemia: Secondary | ICD-10-CM

## 2022-07-08 LAB — COMPREHENSIVE METABOLIC PANEL
ALT: 8 IU/L (ref 0–32)
AST: 23 IU/L (ref 0–40)
Albumin/Globulin Ratio: 1.8 (ref 1.2–2.2)
Albumin: 4.5 g/dL (ref 3.8–4.8)
Alkaline Phosphatase: 68 IU/L (ref 44–121)
BUN/Creatinine Ratio: 19 (ref 12–28)
BUN: 12 mg/dL (ref 8–27)
Bilirubin Total: 0.4 mg/dL (ref 0.0–1.2)
CO2: 23 mmol/L (ref 20–29)
Calcium: 9.3 mg/dL (ref 8.7–10.3)
Chloride: 98 mmol/L (ref 96–106)
Creatinine, Ser: 0.62 mg/dL (ref 0.57–1.00)
Globulin, Total: 2.5 g/dL (ref 1.5–4.5)
Glucose: 92 mg/dL (ref 70–99)
Potassium: 5.4 mmol/L — ABNORMAL HIGH (ref 3.5–5.2)
Sodium: 134 mmol/L (ref 134–144)
Total Protein: 7 g/dL (ref 6.0–8.5)
eGFR: 92 mL/min/{1.73_m2} (ref >59)

## 2022-07-08 LAB — LIPID PANEL
Chol/HDL Ratio: 1.4 ratio (ref 0.0–4.4)
Cholesterol, Total: 144 mg/dL (ref 100–199)
HDL: 100 mg/dL (ref >39)
LDL Chol Calc (NIH): 33 mg/dL (ref 0–99)
Triglycerides: 45 mg/dL (ref 0–149)
VLDL Cholesterol Cal: 11 mg/dL (ref 5–40)

## 2022-07-15 ENCOUNTER — Other Ambulatory Visit: Payer: Self-pay

## 2022-07-15 DIAGNOSIS — E875 Hyperkalemia: Secondary | ICD-10-CM

## 2022-07-16 LAB — BASIC METABOLIC PANEL
BUN/Creatinine Ratio: 45 — ABNORMAL HIGH (ref 12–28)
BUN: 15 mg/dL (ref 8–27)
CO2: 23 mmol/L (ref 20–29)
Calcium: 9.5 mg/dL (ref 8.7–10.3)
Chloride: 93 mmol/L — ABNORMAL LOW (ref 96–106)
Creatinine, Ser: 0.33 mg/dL — ABNORMAL LOW (ref 0.57–1.00)
Glucose: 80 mg/dL (ref 70–99)
Potassium: 4.4 mmol/L (ref 3.5–5.2)
Sodium: 131 mmol/L — ABNORMAL LOW (ref 134–144)
eGFR: 107 mL/min/{1.73_m2} (ref >59)

## 2022-07-24 DIAGNOSIS — H04123 Dry eye syndrome of bilateral lacrimal glands: Secondary | ICD-10-CM | POA: Diagnosis not present

## 2022-09-02 DIAGNOSIS — H04123 Dry eye syndrome of bilateral lacrimal glands: Secondary | ICD-10-CM | POA: Diagnosis not present

## 2022-09-03 ENCOUNTER — Encounter: Payer: Self-pay | Admitting: Cardiovascular Disease

## 2022-09-11 DIAGNOSIS — K219 Gastro-esophageal reflux disease without esophagitis: Secondary | ICD-10-CM | POA: Diagnosis not present

## 2022-09-11 DIAGNOSIS — K529 Noninfective gastroenteritis and colitis, unspecified: Secondary | ICD-10-CM | POA: Diagnosis not present

## 2022-09-11 DIAGNOSIS — Z0000 Encounter for general adult medical examination without abnormal findings: Secondary | ICD-10-CM | POA: Diagnosis not present

## 2022-09-11 DIAGNOSIS — Z833 Family history of diabetes mellitus: Secondary | ICD-10-CM | POA: Diagnosis not present

## 2022-09-11 DIAGNOSIS — M8589 Other specified disorders of bone density and structure, multiple sites: Secondary | ICD-10-CM | POA: Diagnosis not present

## 2022-09-11 DIAGNOSIS — Z1331 Encounter for screening for depression: Secondary | ICD-10-CM | POA: Diagnosis not present

## 2022-09-11 DIAGNOSIS — F3340 Major depressive disorder, recurrent, in remission, unspecified: Secondary | ICD-10-CM | POA: Diagnosis not present

## 2022-09-11 DIAGNOSIS — D508 Other iron deficiency anemias: Secondary | ICD-10-CM | POA: Diagnosis not present

## 2022-09-11 DIAGNOSIS — E871 Hypo-osmolality and hyponatremia: Secondary | ICD-10-CM | POA: Diagnosis not present

## 2022-09-11 DIAGNOSIS — E0590 Thyrotoxicosis, unspecified without thyrotoxic crisis or storm: Secondary | ICD-10-CM | POA: Diagnosis not present

## 2022-09-22 DIAGNOSIS — E0590 Thyrotoxicosis, unspecified without thyrotoxic crisis or storm: Secondary | ICD-10-CM | POA: Diagnosis not present

## 2022-10-01 DIAGNOSIS — E890 Postprocedural hypothyroidism: Secondary | ICD-10-CM | POA: Diagnosis not present

## 2022-10-15 ENCOUNTER — Encounter: Payer: Self-pay | Admitting: Cardiovascular Disease

## 2022-10-15 ENCOUNTER — Ambulatory Visit: Payer: Medicare Other | Attending: Cardiovascular Disease | Admitting: Cardiovascular Disease

## 2022-10-15 DIAGNOSIS — Z79899 Other long term (current) drug therapy: Secondary | ICD-10-CM | POA: Insufficient documentation

## 2022-10-15 DIAGNOSIS — I2510 Atherosclerotic heart disease of native coronary artery without angina pectoris: Secondary | ICD-10-CM | POA: Insufficient documentation

## 2022-10-15 DIAGNOSIS — E7800 Pure hypercholesterolemia, unspecified: Secondary | ICD-10-CM | POA: Diagnosis not present

## 2022-10-15 DIAGNOSIS — E0500 Thyrotoxicosis with diffuse goiter without thyrotoxic crisis or storm: Secondary | ICD-10-CM | POA: Insufficient documentation

## 2022-10-15 DIAGNOSIS — E890 Postprocedural hypothyroidism: Secondary | ICD-10-CM | POA: Insufficient documentation

## 2022-10-15 DIAGNOSIS — I2584 Coronary atherosclerosis due to calcified coronary lesion: Secondary | ICD-10-CM | POA: Diagnosis not present

## 2022-10-15 DIAGNOSIS — I10 Essential (primary) hypertension: Secondary | ICD-10-CM

## 2022-10-15 NOTE — Progress Notes (Unsigned)
Cardiology Office Note    Date:  10/16/2022   ID:  Sara Carson, DOB [birth year], MRN [id number]  PCP:  Merlene Laughter, MD (Inactive)  Cardiologist:  Nicki Guadalajara, MD   6 month F/U cardiology evaluation initially referred by Dr. Pete Glatter for possible pulmonary hypertension.   History of Present Illness:  Sara Carson is a 77 y.o. female who is followed by Dr. Pete Glatter.  I saw her for my initial evaluation on December 14, 2020 and last saw her in December 2023.  She presents for 69-month follow-up evaluation.   Sara Carson was in her usual health until approximately 5 days after she took her second COVID booster.   Approximately 5 days later she started to notice increased heart rate and was diagnosed with hyperthyroidism in late May 2022.  On presentation in addition to her tachycardia, she had weakness, and weight loss. She was seen by Dr. Romero Belling and did not tolerate Tapazole secondary to elevated transaminases nausea and itching.  She ultimately went to [facility] and received radioactive iodine treatment on November 14, 2020.  Following therapy her free T4 has decreased from 4.12 most recently at 1.7.  With her palpitations which initiated in May she was started on atenolol with improvement in her heart rate and she presently continues to take 25 mg twice a day.  June 22, she had an echo Doppler study done at [facility] physicians and the report normal LV cavity size with normal systolic and diastolic function.  Her left atrium was moderately dilated.  Her right ventricle was borderline dilated and there was mild dilatation of her right atrium.  Her mitral valve was structurally normal and there was mild mitral regurgitation.  She was estimated to have possible moderate pulmonary hypertension.  Because of pulmonary hypertension on echocardiographic assessment, she was referred by Dr. Pete Glatter for cardiology evaluation.  When I saw her for initial evaluation with me on December 14, 2020 she  felt improved with reference to her previous sudden development of hyperthyroidism and Graves' disease.  Previous weakness and nausea had resolved.  She was unaware of any significant palpitations, presyncope or syncope.  She denied any chest tightness, PND, or orthopnea.   She has been undergoing follow-up at [facility].  She has continued to be on atenolol 25 mg twice a day.  There has been resolution of her increased heart rate and palpitations.  Her tremors have resolved.  She recently underwent follow-up laboratory on January 25, 2021 which now shows TSH at 26.9.  She has not yet been notified about initiation of any levothyroxine and has a scheduled appointment to see an [facility]ocrinologist in [facility] [place] in early December, 2022.  I recommended she undergo a follow-up echo Doppler study to reassess her previous pulmonary hypertension.  The echo evaluation was done on January 14, 2021 shows normal LV function with EF 55 to 60% with normal diastolic parameters.  There was mild aortic sclerosis without stenosis.  RV and PA pressures were normal at 28.6 mm.  I saw her on January 28, 2021 at which time she felt well and denied any chest pain, palpitations or lethargy.  She had gained approximately 5 pounds.  Her ECG showed mild sinus arrhythmia with isolated PAC.  Blood pressure was controlled.  He was wondering about possibly being able to reduce mentally stop her atenolol.  At that time, I recommended slight reduction of atenolol from 25 mg twice a day to 25 mg in  the morning and 12.5 mg daily.  If her heart rate was to increase he was advised to increase her atenolol back to her prior dose.  He was started to develop hypothyroidism on her most recent TSH.  Subsequently, she has been followed at [facility] endocrinology and on March 22, 2021 saw Carlos Levering, FNP for a post ablative hypothyroidism.  She was told to initiate levothyroxine 88 mcg daily.  Her TSH level had  increased to 26.9 but most recently had again been over suppressed at 0.15 associated with increased palpitations.  I last saw her on May 29, 2021 at which time she was experiencing some fatigability and denied any chest pain or palpitations.    She was  hospitalized on July 24, 2021 and discharged in July 26, 2021.  She presented with nausea, dizziness, vertigo and had unwitnessed syncope and a fall.  She underwent extensive imaging including CT of her head, MRI of her head, MRA of her head and neck which all were negative for acute findings.  It was felt that her syncope was due to severe symptomatic vertigo.  She was hypokalemic and had replacement.  An echo Doppler study was done in July 24, 2021 which showed normal LV function with EF 60 to 65% and grade 1 diastolic dysfunction.  There were no wall motion abnormalities.  RV systolic pressure was normal at 19 mm.  I saw her on Aug 21, 2021.  She continued to be on atenolol 12.5 mg daily and levothyroxine at 75 mg 6 days a week and 37.5 mg 1 day a week.  Her most recent TSH had normalized at 0.713 on July 25, 2021.  During that evaluation, her blood pressure was stable and ECG was normal without ectopy.  She was no longer experiencing palpitations and I suggested slight additional further reduction of a atenolol down to 12.5 mg every other day from daily.  With her documented calcification seen on prior thyroid scan I had suggested she undergo coronary calcium score.  She was not on any lipid-lowering therapy.  I saw her on December 02, 2021. Since her prior evaluation, she was seen again by Carlos Levering, NP at [facility].Marland Kitchen  Apparently she had been started on cholestyramine for severe diarrhea.  Subsequently, she noticed some weight gain.  She admits to approximately 6 pound increase in weight.  TSH on November 25, 2021 was elevated now at 43.78 with T4 low at 0.6.  Apparently she was not advised to make any adjustment in her current levothyroxine dose which she  has been taking 75 mcg daily 6 days a week and 37.5 mg 1 day a week.  She continues to be atenolol 12.5 mg every other day.  She takes Celexa daily.  She denies any chest pain.  LDL cholesterol was 87 in May 2023.  She underwent coronary calcium score on October 09, 2021 and was found to have a score of 83.7, representing 57th percentile for age, race and sex matched control.  Calcification was exclusive to the LAD.  At that time I discussed initiation of statin therapy with target LDL less than 70.  She preferred dietary adjustment initially with recheck in several months.  I last saw her on April 07, 2022. Her thyroid abnormalities have normalized.  She feels well.  She denies chest pain or shortness of breath.  Most recent TSH was six 3.71 on April 01, 2022.  She is on atenolol 12.5 mg every other morning, aspirin 81 mg,  and is now on levothyroxine at 75 mcg daily.  She continues to be on ferrous gluconate.  She takes citalopram.  She has continued to take cholestyramine for her prior diarrhea.  She underwent follow-up laboratory with improved diet and LDL cholesterol was not significantly changed and was 89.  LP(a) was normal at 45 on March 04, 2022.  During that evaluation I recommended the addition of rosuvastatin 10 mg.  Since I last saw her, she has continued to do well.  She notes significantly more energy.  She is exercising vigorously without chest pain or shortness of breath.  She has been eating a heart healthy diet.  She will be participating in a heart study at [facility].  She has not had any palpitations and now has normal thyroid function.  She had undergone recent laboratory by Dr. Margaretann Loveless at [place] who replaced Dr. Pete Glatter after his retirement.  Creatinine 0.55.  Hemoglobin A1c 5.8.  She has chronic anemia with hemoglobin 11 and hematocrit 32.8.  On July 07, 2022 lipid studies now showed LDL cholesterol decreased at 33 with low-dose rosuvastatin.  She was wondering about ultimately  discontinuing atenolol.  She presents for evaluation.    Past Medical History:  Diagnosis Date   Anemia    Depression    Graves disease 10/04/2020   Shingles     Past Surgical History:  Procedure Laterality Date   ABDOMINAL HYSTERECTOMY     TONSILLECTOMY      Current Medications: Outpatient Medications Prior to Visit  Medication Sig Dispense Refill   ascorbic acid (VITAMIN C) 500 MG tablet Take 1 tablet by mouth daily.     atenolol (TENORMIN) 25 MG tablet Take 12.5 mg by mouth every other  morning. 45 tablet 3   cholestyramine (QUESTRAN) 4 GM/DOSE powder Take by mouth daily in the afternoon.     citalopram (CELEXA) 10 MG tablet Take 10 mg by mouth daily.     levothyroxine (SYNTHROID) 75 MCG tablet Take 75 mcg by mouth See admin instructions.     rosuvastatin (CRESTOR) 10 MG tablet Take 1 tablet (10 mg total) by mouth daily. 90 tablet 3   Varenicline Tartrate 0.03 MG/ACT SOLN Administer 1 spray in each nostril 2 times daily.     aspirin EC 81 MG tablet Take 1 tablet (81 mg total) by mouth daily. Swallow whole. 90 tablet 3   cycloSPORINE (RESTASIS) 0.05 % ophthalmic emulsion Place 1 drop into both eyes 2 (two) times daily. (Patient not taking: Reported on 04/07/2022)     ferrous gluconate (FERGON) 240 (27 FE) MG tablet Take 240 mg by mouth daily in the afternoon.     selenium 200 MCG TABS tablet Take 200 mcg by mouth daily in the afternoon.     No facility-administered medications prior to visit.     Allergies:   Levothyroxine, Methimazole, Codeine, Levaquin [levofloxacin in d5w], Levofloxacin, and Amoxicillin   Social History   Socioeconomic History   Marital status: Widowed    Spouse name: Not on file   Number of children: Not on file   Years of education: Not on file   Highest education level: Not on file  Occupational History   Not on file  Tobacco Use   Smoking status: Never   Smokeless tobacco: Never  Vaping Use   Vaping Use: Never used  Substance and Sexual  Activity   Alcohol use: Not Currently    Alcohol/week: 3.0 standard drinks of alcohol    Types: 3 Glasses of  wine per week   Drug use: Never   Sexual activity: Not on file  Other Topics Concern   Not on file  Social History Narrative   Not on file   Social Determinants of Health   Financial Resource Strain: Not on file  Food Insecurity: Not on file  Transportation Needs: Not on file  Physical Activity: Not on file  Stress: Not on file  Social Connections: Not on file    Socially, she is widowed for 5 years.  She does not have children.  She lives alone.  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  There is no tobacco history.  She drinks rare to an occasional glass of wine.  She works out regularly 4 days/week and has a Systems analyst doing strength training and aerobic exercise for 30 to 60 minutes.  Family History:  The patient's family history includes Cancer in her mother; Diabetes in her father; Hyperlipidemia in her father; Hypertension in her father.  Her mother died at age 75 and had metastatic breast cancer.  Her father died at age 42 and had diabetes and gangrene.  She has 2 sisters ages 89 and 102.  ROS General: Negative; No fevers, chills, or night sweats;  HEENT: Negative; No changes in vision or hearing, sinus congestion, difficulty swallowing Pulmonary: Negative; No cough, wheezing, shortness of breath, hemoptysis Cardiovascular: see HPI GI: Negative; No nausea, vomiting, diarrhea, or abdominal pain GU: Negative; No dysuria, hematuria, or difficulty voiding Musculoskeletal: Negative; no myalgias, joint pain, or weakness Hematologic/Oncology: Prior history of iron deficiency Endocrine: Sudden development of hyperthyroidism May 2022; most recent TSH has increased to 43.78 on November 25, 2021 with low T40.6. Neuro: Recent vertigo Skin: Negative; No rashes or skin  lesions Psychiatric: Negative; No behavioral problems, depression Sleep: Negative; No snoring, daytime sleepiness, hypersomnolence, bruxism, restless legs, hypnogognic hallucinations, no cataplexy Other comprehensive 14 point system review is negative.   PHYSICAL EXAM:   VS:  BP 126/86   Pulse 60   Ht 5' 2.5" (1.588 m)   Wt 123 lb (55.8 kg)   SpO2 96%   BMI 22.14 kg/m     Repeat blood pressure by me was 130/72  Wt Readings from Last 3 Encounters:  10/15/22 123 lb (55.8 kg)  04/07/22 120 lb 12.8 oz (54.8 kg)  12/02/21 125 lb 6.4 oz (56.9 kg)    General: Alert, oriented, no distress.  Skin: normal turgor, no rashes, warm and dry HEENT: Normocephalic, atraumatic. Pupils equal round and reactive to light; sclera anicteric; extraocular muscles intact;  Nose without nasal septal hypertrophy Mouth/Parynx benign; Mallinpatti scale 3 Neck: No JVD, no carotid bruits; normal carotid upstroke Lungs: clear to ausculatation and percussion; no wheezing or rales Chest wall: without tenderness to palpitation Heart: PMI not displaced, RRR, s1 s2 normal, 1/6 systolic murmur, no diastolic murmur, no rubs, gallops, thrills, or heaves Abdomen: soft, nontender; no hepatosplenomehaly, BS+; abdominal aorta nontender and not dilated by palpation. Back: no CVA tenderness Pulses 2+ Musculoskeletal: full range of motion, normal strength, no joint deformities Extremities: no clubbing cyanosis or edema, Homan's sign negative  Neurologic: grossly nonfocal; Cranial nerves grossly wnl Psychologic: Normal mood and affect  Studies/Labs Reviewed:   EKG Interpretation Date/Time:  Wednesday October 15 2022 15:37:15 EDT Ventricular Rate:  60 PR Interval:  108 QRS Duration:  80 QT Interval:  402 QTC Calculation: 402 R Axis:   61  Text Interpretation: Sinus rhythm with short PR When compared with ECG of 24-Jul-2021 17:03, PREVIOUS ECG IS PRESENT No significant change since last tracing Confirmed by Nicki Guadalajara ([id number]) on 10/15/2022 4:26:48 PM    April 07, 2022 ECG (independently read by me):  Sinus rhythm at 60, [facility]  December 02, 2021 ECG (independently read by me): Sinus bradycardia at 58 with mild sinus arythmia  Aug 21, 2021 ECG (independently read by me): NSR at 64, no ectopy, normal intervals  May 29, 2021 ECG (independently read by me): Sinus bradycardia at 57, PR 110 msec, no ectopy  January 28, 2021 ECG (independently read by me): Normal sinus rhythm at 60 bpm with isolated PAC and mild sinus arrhythmia.  QTc interval 434 ms.  PR interval today is 122 ms.  There is no evidence for preexcitation.   A second ECG was also done which showed sinus bradycardia at 57 bpm with mild sinus arrhythmia without any PAC;  PR interval at 118 ms.  December 14, 2020 ECG (independently read by me):  Sinus rhythm at 75; short PR interval 92 msec; PACs;QTc 437 msec;   I personally reviewed her ECG from October 12, 2020 which showed sinus arrhythmia with PACs with short PR interval.  There is no evidence for preexcitation.  Recent Labs:    Latest Ref Rng & Units 07/15/2022    3:52 PM 07/07/2022    8:25 AM 03/04/2022    8:12 AM  BMP  Glucose 70 - 99 mg/dL 80  92  91   BUN 8 - 27 mg/dL 15  12  13    Creatinine 0.57 - 1.00 mg/dL 6.04  5.40  9.81   BUN/Creat Ratio 12 - 28 45  19  23   Sodium 134 - 144 mmol/L 131  134  133   Potassium 3.5 - 5.2 mmol/L 4.4  5.4  4.7   Chloride 96 - 106 mmol/L 93  98  96   CO2 20 - 29 mmol/L 23  23  26    Calcium 8.7 - 10.3 mg/dL 9.5  9.3  9.4         Latest Ref Rng & Units 07/07/2022    8:25 AM 03/04/2022    8:12 AM 09/03/2021    8:08 AM  Hepatic Function  Total Protein 6.0 - 8.5 g/dL 7.0  6.6  6.5   Albumin 3.8 - 4.8 g/dL 4.5  4.3  4.3   AST 0 - 40 IU/L 23  19  29    ALT 0 - 32 IU/L 8  5  10    Alk Phosphatase 44 - 121 IU/L 68  69  76   Total Bilirubin 0.0 - 1.2 mg/dL 0.4  0.5  0.4        Latest Ref Rng & Units 03/04/2022    8:12 AM 07/25/2021    5:00 AM  07/24/2021    5:26 PM  CBC  WBC 3.4 - 10.8 x10E3/uL 4.6  6.2    Hemoglobin 11.1 - 15.9 g/dL 19.1  47.8  29.5   Hematocrit 34.0 - 46.6 % 33.6  33.4  38.0   Platelets 150 - 450 x10E3/uL 340  306     Lab Results  Component Value Date   MCV 93 03/04/2022   MCV 93.8 07/25/2021   MCV 92.3 07/24/2021   Lab Results  Component Value Date   TSH 4.110 03/04/2022   No results found for: "HGBA1C"   BNP No results found for: "BNP"  ProBNP No  results found for: "PROBNP"   Lipid Panel     Component Value Date/Time   CHOL 144 07/07/2022 0821   TRIG 45 07/07/2022 0821   HDL 100 07/07/2022 0821   CHOLHDL 1.4 07/07/2022 0821   LDLCALC 33 07/07/2022 0821   LABVLDL 11 07/07/2022 0821     RADIOLOGY: No results found.   Additional studies/ records that were reviewed today include:  I reviewed the records of Dr. Pete Glatter at [place].  I reviewed the records of Dr. Everardo All and ER evaluation.  ECHO: 12/14/2020 IMPRESSIONS   1. Left ventricular ejection fraction, by estimation, is 55 to 60%. Left  ventricular ejection fraction by 3D volume is 56 %. The left ventricle has  normal function. The left ventricle has no regional wall motion  abnormalities. Left ventricular diastolic   parameters were normal. The average left ventricular global longitudinal  strain is -21.4 %. The global longitudinal strain is normal.   2. Right ventricular systolic function is normal. The right ventricular  size is normal. There is normal pulmonary artery systolic pressure. The  estimated right ventricular systolic pressure is 28.6 mmHg.   3. The mitral valve is normal in structure. Trivial mitral valve  regurgitation. No evidence of mitral stenosis.   4. The aortic valve is tricuspid. Aortic valve regurgitation is not  visualized. Mild aortic valve sclerosis is present, with no evidence of  aortic valve stenosis.   5. The inferior vena cava is normal in size with greater than 50%  respiratory variability,  suggesting right atrial pressure of 3 mmHg.   ECHO: 07/24/2021  1. Left ventricular ejection fraction, by estimation, is 60 to 65%. Left  ventricular ejection fraction by 2D MOD biplane is 61.5 %. The left  ventricle has normal function. The left ventricle has no regional wall  motion abnormalities. Left ventricular  diastolic parameters are consistent with Grade I diastolic dysfunction  (impaired relaxation).   2. Right ventricular systolic function is normal. The right ventricular  size is normal. There is normal pulmonary artery systolic pressure. The  estimated right ventricular systolic pressure is 19.3 mmHg.   3. The mitral valve is abnormal. Trivial mitral valve regurgitation.   4. The aortic valve is tricuspid. Aortic valve regurgitation is not  visualized. No aortic stenosis is present.   5. The inferior vena cava is normal in size with greater than 50%  respiratory variability, suggesting right atrial pressure of 3 mmHg.   ASSESSMENT:    1. Coronary artery calcification   2. Pure hypercholesterolemia   3. Graves disease: s/p radioactive iodine November 14, 2020   4. Postablative hypothyroidism   5. Medication management     PLAN:  Sara Carson is a very pleasant 89 -year-old female who developed sudden onset of hyperthyroidism/Graves' disease in May 2022 which she temporally relates to approximately 5 days after getting her second COVID booster shot.  At initial presentation she began to notice significant increase in heart rate as weakness.  She was started on methimazole on September 19, 2020.  She subsequently presented to [facility] ER [facility] on October 12, 2020 with generalized weakness and weight loss of 15 pounds over the prior month.   She developed diffuse itching of her skin and it was felt that this may have been related to her methimazole.  She subsequently underwent treatment at [facility] and received radioactive iodine therapy on November 14, 2020.  Her free T4 levels had  slowly decreased  to1.7.  On her  thyroid scan she was noted to have mild coronary calcification early in June she had an echo Doppler study at [facility] which was interpreted by Dr. Oswaldo Milian.  She was found to have normal systolic and diastolic function with EF at 62%.  There was moderate left atrial dilatation as well as mild RA dilatation.  RV was felt to be borderline dilated.  Her mitral valve is structurally normal although there was mild MR.  She was estimated to have possible moderate pulmonary hypertension.  She was started on atenolol 25 mg twice a day with significant improvement in her palpitations and reduction in her heart rate.  I recommended she undergo follow-up echo Doppler study for reassessment after treatment of her Graves' disease for further evaluation of her previously detected pulmonary hypertension which was done on January 14, 2021 .  Her echo from July 24, 2021 showed normal systolic and diastolic function with EF at 65 to 60%; there was no evidence for any atrial enlargement.  Pulmonary artery systolic and right ventricular systolic pressure was normal at 28.6.  There was evidence for mild aortic sclerosis without stenosis.  There was trivial mitral regurgitation.  A a prior office visit I suggested slight reduction of atenolol to 25 mg in the morning and 12.5 mg at night.  She subsequently developed increased TSH following her ablation and was started on levothyroxine at initial dose of 88 mcg at [facility].  At her evaluation in February 2023 TSH was again over suppressed.  Presently, she is now on levothyroxine 75 mg 6 days a week and 37.5 mg daily.  When seen in May 2023, her most recent TSH was in the normal range at 0.713.  Her blood pressure was stable.  She was no longer experiencing palpitations and I suggested slight additional further reduction of atenolol down to 12.5 mg every other day.  Apparently, she recently has had issues with severe  diarrhea and was started on cholestyramine.  She was seen by Fredia Sorrow, NP at [facility] and laboratory from November 25, 2021  showed significant TSH elevation at 43.78 and T4 was low at 0.6.  She has continued to be on her same regimen of levothyroxine 75 mg 6 days a week and 37.5 mg daily.  Although I did not want to make any significant major change I did suggest that she may should at least contact Fredia Sorrow to see if in fact she should at least increase her levothyroxine to 75 mg daily with plans for her TSH which is scheduled to be rechecked in 1 month.  I discussed potential hypothyroidism also further contributing to hyperlipidemia.   At her office visit in August 2023 I reviewed her coronary calcium score which was elevated at 83.7 with calcification currently entirely in the LAD.  I discussed initiation of therapy but she wished to defer at least for recheck.  Her most recent LDL cholesterol in November was now 82.  Her LP(a) was normal at 45.6.  Consequently, at her December 2020 for evaluation I added low-dose rosuvastatin at 10 mg.  Subsequent laboratory showed significant benefit with LDL cholesterol decreasing down to 33.  Total cholesterol was 144, HDL 100, and triglycerides 45.  She continues to be euthyroid on her present dose of levothyroxine 75 mcg.  She has been on atenolol now at reduced dose of 12.5 mg every other day.  Her resting pulse is 66.  There are no episodes of tachycardia.  I have suggested she  reduce this to every third day for the next week and then discontinue atenolol altogether.  Her blood pressure today is excellent and this will need to be monitored.  She will be following up with her primary care doctor in the fall.  I will see her in March/April 2025 for follow-up evaluation.   Medication Adjustments/Labs and Tests Ordered: Current medicines are reviewed at length with the patient today.  Concerns regarding medicines are outlined above.  Medication changes, Labs and  Tests ordered today are listed in the Patient Instructions below. Patient Instructions  Medication Instructions:  Take Atenolol every third day for 1 week to begin weaning. After one week STOP medication.  *If you need a refill on your cardiac medications before your next appointment, please call your pharmacy*   Lab Work: Return for fasting labs in 6 months, no appointment needed.  If you have labs (blood work) drawn today and your tests are completely normal, you will receive your results only by: MyChart Message (if you have MyChart) OR A paper copy in the mail If you have any lab test that is abnormal or we need to change your treatment, we will call you to review the results.   Testing/Procedures: None    Follow-Up: At [facility], you and your health needs are our priority.  As part of our continuing mission to provide you with exceptional heart care, we have created designated Provider Care Teams.  These Care Teams include your primary Cardiologist (physician) and Advanced Practice Providers (APPs -  Physician Assistants and Nurse Practitioners) who all work together to provide you with the care you need, when you need it.  We recommend signing up for the patient portal called "MyChart".  Sign up information is provided on this After Visit Summary.  MyChart is used to connect with patients for Virtual Visits (Telemedicine).  Patients are able to view lab/test results, encounter notes, upcoming appointments, etc.  Non-urgent messages can be sent to your provider as well.   To learn more about what you can do with MyChart, go to [email or web address].    Your next appointment:   8-9 month(s)  Provider:   Nicki Guadalajara, MD        Signed, Nicki Guadalajara, MD  10/16/2022 3:24 PM    [facility] [address], [address], [place], [place]  [address] Phone: [phone]

## 2022-10-15 NOTE — Patient Instructions (Addendum)
Medication Instructions:  Take Atenolol every third day for 1 week to begin weaning. After one week STOP medication.  *If you need a refill on your cardiac medications before your next appointment, please call your pharmacy*   Lab Work: Return for fasting labs in 6 months, no appointment needed.  If you have labs (blood work) drawn today and your tests are completely normal, you will receive your results only by: MyChart Message (if you have MyChart) OR A paper copy in the mail If you have any lab test that is abnormal or we need to change your treatment, we will call you to review the results.   Testing/Procedures: None    Follow-Up: At [facility], you and your health needs are our priority.  As part of our continuing mission to provide you with exceptional heart care, we have created designated Provider Care Teams.  These Care Teams include your primary Cardiologist (physician) and Advanced Practice Providers (APPs -  Physician Assistants and Nurse Practitioners) who all work together to provide you with the care you need, when you need it.  We recommend signing up for the patient portal called "MyChart".  Sign up information is provided on this After Visit Summary.  MyChart is used to connect with patients for Virtual Visits (Telemedicine).  Patients are able to view lab/test results, encounter notes, upcoming appointments, etc.  Non-urgent messages can be sent to your provider as well.   To learn more about what you can do with MyChart, go to [email or web address].    Your next appointment:   8-9 month(s)  Provider:   Nicki Guadalajara, MD

## 2022-10-16 ENCOUNTER — Encounter: Payer: Self-pay | Admitting: Cardiovascular Disease

## 2022-12-05 DIAGNOSIS — H04123 Dry eye syndrome of bilateral lacrimal glands: Secondary | ICD-10-CM | POA: Diagnosis not present

## 2022-12-05 DIAGNOSIS — H2513 Age-related nuclear cataract, bilateral: Secondary | ICD-10-CM | POA: Diagnosis not present

## 2023-01-13 DIAGNOSIS — Z23 Encounter for immunization: Secondary | ICD-10-CM | POA: Diagnosis not present

## 2023-01-20 DIAGNOSIS — Z23 Encounter for immunization: Secondary | ICD-10-CM | POA: Diagnosis not present

## 2023-01-29 ENCOUNTER — Encounter: Payer: Self-pay | Admitting: Cardiovascular Disease

## 2023-02-09 DIAGNOSIS — E890 Postprocedural hypothyroidism: Secondary | ICD-10-CM | POA: Diagnosis not present

## 2023-02-11 ENCOUNTER — Other Ambulatory Visit: Payer: Self-pay

## 2023-02-11 ENCOUNTER — Encounter: Payer: Self-pay | Admitting: Cardiovascular Disease

## 2023-02-11 DIAGNOSIS — Z131 Encounter for screening for diabetes mellitus: Secondary | ICD-10-CM

## 2023-02-11 NOTE — Telephone Encounter (Signed)
Message sent for Dr Tresa Endo to respond question

## 2023-03-04 DIAGNOSIS — R7303 Prediabetes: Secondary | ICD-10-CM | POA: Diagnosis not present

## 2023-03-04 DIAGNOSIS — E890 Postprocedural hypothyroidism: Secondary | ICD-10-CM | POA: Diagnosis not present

## 2023-03-11 ENCOUNTER — Other Ambulatory Visit: Payer: Self-pay | Admitting: Medical Genetics

## 2023-03-11 DIAGNOSIS — Z006 Encounter for examination for normal comparison and control in clinical research program: Secondary | ICD-10-CM

## 2023-03-31 ENCOUNTER — Other Ambulatory Visit: Payer: Self-pay

## 2023-03-31 DIAGNOSIS — E7800 Pure hypercholesterolemia, unspecified: Secondary | ICD-10-CM | POA: Diagnosis not present

## 2023-03-31 LAB — COMPREHENSIVE METABOLIC PANEL
ALT: 11 [IU]/L (ref 0–32)
AST: 27 [IU]/L (ref 0–40)
Albumin: 4.3 g/dL (ref 3.8–4.8)
Alkaline Phosphatase: 57 [IU]/L (ref 44–121)
BUN/Creatinine Ratio: 21 (ref 12–28)
BUN: 13 mg/dL (ref 8–27)
Bilirubin Total: 0.4 mg/dL (ref 0.0–1.2)
CO2: 26 mmol/L (ref 20–29)
Calcium: 9.3 mg/dL (ref 8.7–10.3)
Chloride: 95 mmol/L — ABNORMAL LOW (ref 96–106)
Creatinine, Ser: 0.61 mg/dL (ref 0.57–1.00)
Globulin, Total: 2.6 g/dL (ref 1.5–4.5)
Glucose: 86 mg/dL (ref 70–99)
Potassium: 4.9 mmol/L (ref 3.5–5.2)
Sodium: 131 mmol/L — ABNORMAL LOW (ref 134–144)
Total Protein: 6.9 g/dL (ref 6.0–8.5)
eGFR: 93 mL/min/{1.73_m2} (ref >59)

## 2023-03-31 LAB — LIPID PANEL
Chol/HDL Ratio: 1.5 {ratio} (ref 0.0–4.4)
Cholesterol, Total: 151 mg/dL (ref 100–199)
HDL: 102 mg/dL (ref >39)
LDL Chol Calc (NIH): 41 mg/dL (ref 0–99)
Triglycerides: 30 mg/dL (ref 0–149)
VLDL Cholesterol Cal: 8 mg/dL (ref 5–40)

## 2023-04-09 ENCOUNTER — Other Ambulatory Visit (HOSPITAL_COMMUNITY)
Admission: RE | Admit: 2023-04-09 | Discharge: 2023-04-09 | Disposition: A | Discharge status: Home / Self Care | Payer: Medicare Other | Source: Ambulatory Visit | Attending: Medical Genetics | Admitting: Medical Genetics

## 2023-04-09 DIAGNOSIS — Z006 Encounter for examination for normal comparison and control in clinical research program: Secondary | ICD-10-CM | POA: Insufficient documentation

## 2023-04-13 ENCOUNTER — Other Ambulatory Visit: Payer: Self-pay | Admitting: Cardiovascular Disease

## 2023-04-20 LAB — GENECONNECT MOLECULAR SCREEN: Genetic Analysis Overall Interpretation: NEGATIVE

## 2023-04-28 DIAGNOSIS — Z1231 Encounter for screening mammogram for malignant neoplasm of breast: Secondary | ICD-10-CM | POA: Diagnosis not present

## 2023-05-18 DIAGNOSIS — L821 Other seborrheic keratosis: Secondary | ICD-10-CM | POA: Diagnosis not present

## 2023-05-18 DIAGNOSIS — D225 Melanocytic nevi of trunk: Secondary | ICD-10-CM | POA: Diagnosis not present

## 2023-05-18 DIAGNOSIS — L812 Freckles: Secondary | ICD-10-CM | POA: Diagnosis not present

## 2023-05-18 DIAGNOSIS — D224 Melanocytic nevi of scalp and neck: Secondary | ICD-10-CM | POA: Diagnosis not present

## 2023-05-18 DIAGNOSIS — D2372 Other benign neoplasm of skin of left lower limb, including hip: Secondary | ICD-10-CM | POA: Diagnosis not present

## 2023-06-08 DIAGNOSIS — H04123 Dry eye syndrome of bilateral lacrimal glands: Secondary | ICD-10-CM | POA: Diagnosis not present

## 2023-06-08 DIAGNOSIS — H2513 Age-related nuclear cataract, bilateral: Secondary | ICD-10-CM | POA: Diagnosis not present

## 2023-07-15 ENCOUNTER — Ambulatory Visit: Payer: Medicare Other | Attending: Cardiovascular Disease | Admitting: Cardiovascular Disease

## 2023-07-15 ENCOUNTER — Encounter: Payer: Self-pay | Admitting: Cardiovascular Disease

## 2023-07-15 VITALS — BP 140/88 | HR 65 | Ht 62.5 in | Wt 125.8 lb

## 2023-07-15 DIAGNOSIS — E890 Postprocedural hypothyroidism: Secondary | ICD-10-CM | POA: Diagnosis not present

## 2023-07-15 DIAGNOSIS — E0500 Thyrotoxicosis with diffuse goiter without thyrotoxic crisis or storm: Secondary | ICD-10-CM

## 2023-07-15 DIAGNOSIS — I10 Essential (primary) hypertension: Secondary | ICD-10-CM

## 2023-07-15 DIAGNOSIS — E7800 Pure hypercholesterolemia, unspecified: Secondary | ICD-10-CM | POA: Diagnosis not present

## 2023-07-15 DIAGNOSIS — M810 Age-related osteoporosis without current pathological fracture: Secondary | ICD-10-CM

## 2023-07-15 DIAGNOSIS — I2510 Atherosclerotic heart disease of native coronary artery without angina pectoris: Secondary | ICD-10-CM

## 2023-07-15 NOTE — Patient Instructions (Signed)
 Medication Instructions:  No medication changes were made during today's visit.  *If you need a refill on your cardiac medications before your next appointment, please call your pharmacy*   Lab Work: No labs were ordered during today's visit.  If you have labs (blood work) drawn today and your tests are completely normal, you will receive your results only by: MyChart Message (if you have MyChart) OR A paper copy in the mail If you have any lab test that is abnormal or we need to change your treatment, we will call you to review the results.   Testing/Procedures: No procedures were ordered during today's visit.    Follow-Up: At [facility], you and your health needs are our priority.  As part of our continuing mission to provide you with exceptional heart care, we have created designated Provider Care Teams.  These Care Teams include your primary Cardiologist (physician) and Advanced Practice Providers (APPs -  Physician Assistants and Nurse Practitioners) who all work together to provide you with the care you need, when you need it.  We recommend signing up for the patient portal called "MyChart".  Sign up information is provided on this After Visit Summary.  MyChart is used to connect with patients for Virtual Visits (Telemedicine).  Patients are able to view lab/test results, encounter notes, upcoming appointments, etc.  Non-urgent messages can be sent to your provider as well.   To learn more about what you can do with MyChart, go to [email or web address].    Your next appointment:   8 month(s)  Provider:   Dr Jodelle Red     Other Instructions Thank you for choosing [facility]!

## 2023-07-15 NOTE — Progress Notes (Unsigned)
 Cardiology Office Note    Date:  07/17/2023   ID:  Sara Carson, DOB [birth year], MRN [id number]  PCP:  Thana Ates, MD  Cardiologist:  Nicki Guadalajara, MD   9 month F/U cardiology evaluation initially referred by Dr. Pete Glatter for possible pulmonary hypertension.   History of Present Illness:  Sara Carson is a 78 y.o. female who was followed by Dr. Pete Glatter and now sees Dr. Margaretann Loveless following his retirement . I saw her for my initial evaluation on December 14, 2020 and last saw her in June 2024.  She presents for 10-month follow-up evaluation.   Sara Carson was in her usual health until approximately 5 days after she took her second COVID booster.   Approximately 5 days later she started to notice increased heart rate and was diagnosed with hyperthyroidism in late May 2022.  On presentation in addition to her tachycardia, she had weakness, and weight loss. She was seen by Dr. Romero Belling and did not tolerate Tapazole secondary to elevated transaminases nausea and itching.  She ultimately went to [facility] and received radioactive iodine treatment on November 14, 2020.  Following therapy her free T4 has decreased from 4.12 most recently at 1.7.  With her palpitations which initiated in May she was started on atenolol with improvement in her heart rate and she presently continues to take 25 mg twice a day.  June 22, she had an echo Doppler study done at [facility] physicians and the report normal LV cavity size with normal systolic and diastolic function.  Her left atrium was moderately dilated.  Her right ventricle was borderline dilated and there was mild dilatation of her right atrium.  Her mitral valve was structurally normal and there was mild mitral regurgitation.  She was estimated to have possible moderate pulmonary hypertension.  Because of pulmonary hypertension on echocardiographic assessment, she was referred by Dr. Pete Glatter for cardiology evaluation.  When I saw her for initial evaluation  with me on December 14, 2020 she felt improved with reference to her previous sudden development of hyperthyroidism and Graves' disease.  Previous weakness and nausea had resolved.  She was unaware of any significant palpitations, presyncope or syncope.  She denied any chest tightness, PND, or orthopnea.   She has been undergoing follow-up at [facility].  She has continued to be on atenolol 25 mg twice a day.  There has been resolution of her increased heart rate and palpitations.  Her tremors have resolved.  She recently underwent follow-up laboratory on January 25, 2021 which now shows TSH at 26.9.  She has not yet been notified about initiation of any levothyroxine and has a scheduled appointment to see an [facility]inologist in [facility] [place] in early December, 2022.  I recommended she undergo a follow-up echo Doppler study to reassess her previous pulmonary hypertension.  The echo evaluation was done on January 14, 2021 shows normal LV function with EF 55 to 60% with normal diastolic parameters.  There was mild aortic sclerosis without stenosis.  RV and PA pressures were normal at 28.6 mm.  I saw her on January 28, 2021 at which time she felt well and denied any chest pain, palpitations or lethargy.  She had gained approximately 5 pounds.  Her ECG showed mild sinus arrhythmia with isolated PAC.  Blood pressure was controlled.  He was wondering about possibly being able to reduce mentally stop her atenolol.  At that time, I recommended slight reduction of atenolol from 25  mg twice a day to 25 mg in the morning and 12.5 mg daily.  If her heart rate was to increase he was advised to increase her atenolol back to her prior dose.  He was started to develop hypothyroidism on her most recent TSH.  Subsequently, she has been followed at [facility]ology and on March 22, 2021 saw Carlos Levering, FNP for a post ablative hypothyroidism.  She was told to initiate levothyroxine 88  mcg daily.  Her TSH level had increased to 26.9 but most recently had again been over suppressed at 0.15 associated with increased palpitations.  I last saw her on May 29, 2021 at which time she was experiencing some fatigability and denied any chest pain or palpitations.    She was  hospitalized on July 24, 2021 and discharged in July 26, 2021.  She presented with nausea, dizziness, vertigo and had unwitnessed syncope and a fall.  She underwent extensive imaging including CT of her head, MRI of her head, MRA of her head and neck which all were negative for acute findings.  It was felt that her syncope was due to severe symptomatic vertigo.  She was hypokalemic and had replacement.  An echo Doppler study was done in July 24, 2021 which showed normal LV function with EF 60 to 65% and grade 1 diastolic dysfunction.  There were no wall motion abnormalities.  RV systolic pressure was normal at 19 mm.  I saw her on Aug 21, 2021.  She continued to be on atenolol 12.5 mg daily and levothyroxine at 75 mg 6 days a week and 37.5 mg 1 day a week.  Her most recent TSH had normalized at 0.713 on July 25, 2021.  During that evaluation, her blood pressure was stable and ECG was normal without ectopy.  She was no longer experiencing palpitations and I suggested slight additional further reduction of a atenolol down to 12.5 mg every other day from daily.  With her documented calcification seen on prior thyroid scan I had suggested she undergo coronary calcium score.  She was not on any lipid-lowering therapy.  I saw her on December 02, 2021. Since her prior evaluation, she was seen again by Carlos Levering, NP at [facility].Marland Kitchen  Apparently she had been started on cholestyramine for severe diarrhea.  Subsequently, she noticed some weight gain.  She admits to approximately 6 pound increase in weight.  TSH on November 25, 2021 was elevated now at 43.78 with T4 low at 0.6.  Apparently she was not advised to make any adjustment in her current  levothyroxine dose which she has been taking 75 mcg daily 6 days a week and 37.5 mg 1 day a week.  She continues to be atenolol 12.5 mg every other day.  She takes Celexa daily.  She denies any chest pain.  LDL cholesterol was 87 in May 2023.  She underwent coronary calcium score on October 09, 2021 and was found to have a score of 83.7, representing 57th percentile for age, race and sex matched control.  Calcification was exclusive to the LAD.  At that time I discussed initiation of statin therapy with target LDL less than 70.  She preferred dietary adjustment initially with recheck in several months.  I saw her on April 07, 2022. Her thyroid abnormalities have normalized.  She feels well.  She denies chest pain or shortness of breath.  Most recent TSH was six 3.71 on April 01, 2022.  She is on atenolol 12.5  mg every other morning, aspirin 81 mg, and is now on levothyroxine at 75 mcg daily.  She continues to be on ferrous gluconate.  She takes citalopram.  She has continued to take cholestyramine for her prior diarrhea.  She underwent follow-up laboratory with improved diet and LDL cholesterol was not significantly changed and was 89.  LP(a) was normal at 45 on March 04, 2022.  During that evaluation I recommended the addition of rosuvastatin 10 mg.  I last saw her on October 15, 2022 at which time she continued to do well.  She notes significantly more energy.  She is exercising vigorously without chest pain or shortness of breath.  She has been eating a heart healthy diet.  She will be participating in a heart study at [facility].  She has not had any palpitations and now has normal thyroid function.  She had undergone recent laboratory by Dr. Margaretann Loveless at [place] who replaced Dr. Pete Glatter after his retirement.  Creatinine 0.55.  Hemoglobin A1c 5.8.  She has chronic anemia with hemoglobin 11 and hematocrit 32.8.  On July 07, 2022 lipid studies now showed LDL cholesterol decreased at 33 with low-dose  rosuvastatin.  She was wondering about ultimately discontinuing atenolol.  At that time, she was taking a atenolol 12.5 mg every other day with a resting pulse at 66.  I suggest that she reduce this to every third day for the next week and then discontinue atenolol altogether.  Presently, she feels well.  She is followed by Dr. Margaretann Loveless and has osteopenia.  She has been exercising.  She had undergone laboratory in February 2025 which showed total cholesterol 146, triglycerides 33, HDL 111 and LDL cholesterol 26.  She has been taking every other day aspirin.  She is on Celexa for anxiety, levothyroxine 75 mcg for hypothyroidism and continues to take rosuvastatin 10 mg daily.  She presents for evaluation.    Past Medical History:  Diagnosis Date   Anemia    Depression    Graves disease 10/04/2020   Shingles     Past Surgical History:  Procedure Laterality Date   ABDOMINAL HYSTERECTOMY     TONSILLECTOMY      Current Medications: Outpatient Medications Prior to Visit  Medication Sig Dispense Refill   ascorbic acid (VITAMIN C) 500 MG tablet Take 1 tablet by mouth daily.     Calcium Citrate 250 MG TABS Take by mouth in the morning and at bedtime.     Cholecalciferol (VITAMIN D) 50 MCG (2000 UT) tablet Take 2,000 Units by mouth daily.     citalopram (CELEXA) 10 MG tablet Take 5 mg by mouth daily.     EVENING PRIMROSE OIL PO Take 1 tablet by mouth in the morning and at bedtime.     ferrous gluconate (FERGON) 324 MG tablet Take 324 mg by mouth daily with breakfast.     levothyroxine (SYNTHROID) 75 MCG tablet Take 75 mcg by mouth See admin instructions.     Multiple Vitamin (MULTIVITAMIN) tablet Take 1 tablet by mouth daily.     rosuvastatin (CRESTOR) 10 MG tablet TAKE 1 TABLET BY MOUTH DAILY 90 tablet 1   atenolol (TENORMIN) 25 MG tablet Take 12.5 mg by mouth every other  morning. 45 tablet 3   cholestyramine (QUESTRAN) 4 GM/DOSE powder Take by mouth daily in the afternoon.     Varenicline  Tartrate 0.03 MG/ACT SOLN Administer 1 spray in each nostril 2 times daily.     aspirin EC 81 MG tablet  Take 1 tablet (81 mg total) by mouth daily. Swallow whole. 90 tablet 3   ferrous gluconate (FERGON) 240 (27 FE) MG tablet Take 240 mg by mouth daily in the afternoon.     gabapentin (NEURONTIN) 300 MG capsule Day 1: 300 mg, Day 2: 300 mg twice daily, Day 3: 300 mg 3 times daily 90 capsule 1   No facility-administered medications prior to visit.     Allergies:   Methimazole, Codeine, Levaquin [levofloxacin in d5w], and Amoxicillin   Social History   Socioeconomic History   Marital status: Widowed    Spouse name: Not on file   Number of children: Not on file   Years of education: Not on file   Highest education level: Not on file  Occupational History   Not on file  Tobacco Use   Smoking status: Never   Smokeless tobacco: Never  Vaping Use   Vaping status: Never Used  Substance and Sexual Activity   Alcohol use: Not Currently    Alcohol/week: 3.0 standard drinks of alcohol    Types: 3 Glasses of wine per week   Drug use: Never   Sexual activity: Not on file  Other Topics Concern   Not on file  Social History Narrative   Not on file   Social Drivers of Health   Financial Resource Strain: Not on file  Food Insecurity: Not on file  Transportation Needs: Not on file  Physical Activity: Not on file  Stress: Not on file  Social Connections: Not on file    Socially, she is widowed for 6 years.  She does not have children.  She lives alone.  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  There is no tobacco history.  She drinks rare to an occasional glass of wine.  She works out regularly 4 days/week and has a Systems analyst doing strength training and aerobic exercise for 30 to 60 minutes.  Family History:  The patient's family history includes Cancer in her mother; Diabetes in her father;  Hyperlipidemia in her father; Hypertension in her father.  Her mother died at age 57 and had metastatic breast cancer.  Her father died at age 41 and had diabetes and gangrene.  She has 2 sisters ages 49 and 56.  ROS General: Negative; No fevers, chills, or night sweats;  HEENT: Negative; No changes in vision or hearing, sinus congestion, difficulty swallowing Pulmonary: Negative; No cough, wheezing, shortness of breath, hemoptysis Cardiovascular: see HPI GI: Negative; No nausea, vomiting, diarrhea, or abdominal pain GU: Negative; No dysuria, hematuria, or difficulty voiding Musculoskeletal: Negative; no myalgias, joint pain, or weakness Hematologic/Oncology: Prior history of iron deficiency Endocrine: Sudden development of hyperthyroidism May 2022; most recent TSH has increased to 43.78 on November 25, 2021 with low T40.6. Neuro: Recent vertigo Skin: Negative; No rashes or skin lesions Psychiatric: Negative; No behavioral problems, depression Sleep: Negative; No snoring, daytime sleepiness, hypersomnolence, bruxism, restless legs, hypnogognic hallucinations, no cataplexy Other comprehensive 14 point system review is negative.   PHYSICAL EXAM:   VS:  BP (!) 140/88   Pulse 65   Ht 5' 2.5" (1.588 m)   Wt 125 lb 12.8 oz (57.1 kg)   SpO2 94%   BMI 22.64 kg/m     Repeat blood pressure by me was 130/78  Wt Readings from Last 3 Encounters:  07/15/23 125 lb 12.8 oz (57.1 kg)  10/15/22 123  lb (55.8 kg)  04/07/22 120 lb 12.8 oz (54.8 kg)    General: Alert, oriented, no distress.  Skin: normal turgor, no rashes, warm and dry HEENT: Normocephalic, atraumatic. Pupils equal round and reactive to light; sclera anicteric; extraocular muscles intact;  Nose without nasal septal hypertrophy Mouth/Parynx benign; Mallinpatti scale 2/3 Neck: No JVD, no carotid bruits; normal carotid upstroke Lungs: clear to ausculatation and percussion; no wheezing or rales Chest wall: without tenderness to  palpitation Heart: PMI not displaced, RRR, s1 s2 normal, 1/6 systolic murmur, no diastolic murmur, no rubs, gallops, thrills, or heaves Abdomen: soft, nontender; no hepatosplenomehaly, BS+; abdominal aorta nontender and not dilated by palpation. Back: no CVA tenderness Pulses 2+ Musculoskeletal: full range of motion, normal strength, no joint deformities Extremities: no clubbing cyanosis or edema, Homan's sign negative  Neurologic: grossly nonfocal; Cranial nerves grossly wnl Psychologic: Normal mood and affect    Studies/Labs Reviewed:   EKG Interpretation Date/Time:  Wednesday July 15 2023 07:56:56 EDT Ventricular Rate:  65 PR Interval:  112 QRS Duration:  84 QT Interval:  404 QTC Calculation: 420 R Axis:   51  Text Interpretation: Sinus rhythm with marked sinus arrhythmia When compared with ECG of 15-Oct-2022 15:37, No significant change was found Confirmed by Nicki Guadalajara ([id number]) on 07/17/2023 9:49:38 AM    October 15, 2022 ECG (independently read by me sinus rhythm, short PR at 108 ms.  April 07, 2022 ECG (independently read by me):  Sinus rhythm at 60, [facility]  December 02, 2021 ECG (independently read by me): Sinus bradycardia at 58 with mild sinus arythmia  Aug 21, 2021 ECG (independently read by me): NSR at 64, no ectopy, normal intervals  May 29, 2021 ECG (independently read by me): Sinus bradycardia at 57, PR 110 msec, no ectopy  January 28, 2021 ECG (independently read by me): Normal sinus rhythm at 60 bpm with isolated PAC and mild sinus arrhythmia.  QTc interval 434 ms.  PR interval today is 122 ms.  There is no evidence for preexcitation.   A second ECG was also done which showed sinus bradycardia at 57 bpm with mild sinus arrhythmia without any PAC;  PR interval at 118 ms.  December 14, 2020 ECG (independently read by me):  Sinus rhythm at 75; short PR interval 92 msec; PACs;QTc 437 msec;   I personally reviewed her ECG from October 12, 2020 which showed sinus  arrhythmia with PACs with short PR interval.  There is no evidence for preexcitation.  Recent Labs:    Latest Ref Rng & Units 03/31/2023    8:22 AM 07/15/2022    3:52 PM 07/07/2022    8:25 AM  BMP  Glucose 70 - 99 mg/dL 86  80  92   BUN 8 - 27 mg/dL 13  15  12    Creatinine 0.57 - 1.00 mg/dL 1.19  1.47  8.29   BUN/Creat Ratio 12 - 28 21  45  19   Sodium 134 - 144 mmol/L 131  131  134   Potassium 3.5 - 5.2 mmol/L 4.9  4.4  5.4   Chloride 96 - 106 mmol/L 95  93  98   CO2 20 - 29 mmol/L 26  23  23    Calcium 8.7 - 10.3 mg/dL 9.3  9.5  9.3         Latest Ref Rng & Units 03/31/2023    8:22 AM 07/07/2022    8:25 AM 03/04/2022    8:12 AM  Hepatic Function  Total Protein 6.0 - 8.5 g/dL 6.9  7.0  6.6   Albumin 3.8 - 4.8 g/dL 4.3  4.5  4.3   AST 0 - 40 IU/L 27  23  19    ALT 0 - 32 IU/L 11  8  5    Alk Phosphatase 44 - 121 IU/L 57  68  69   Total Bilirubin 0.0 - 1.2 mg/dL 0.4  0.4  0.5        Latest Ref Rng & Units 03/04/2022    8:12 AM 07/25/2021    5:00 AM 07/24/2021    5:26 PM  CBC  WBC 3.4 - 10.8 x10E3/uL 4.6  6.2    Hemoglobin 11.1 - 15.9 g/dL 56.2  13.0  86.5   Hematocrit 34.0 - 46.6 % 33.6  33.4  38.0   Platelets 150 - 450 x10E3/uL 340  306     Lab Results  Component Value Date   MCV 93 03/04/2022   MCV 93.8 07/25/2021   MCV 92.3 07/24/2021   Lab Results  Component Value Date   TSH 4.110 03/04/2022   No results found for: "HGBA1C"   BNP No results found for: "BNP"  ProBNP No results found for: "PROBNP"   Lipid Panel     Component Value Date/Time   CHOL 151 03/31/2023 0822   TRIG 30 03/31/2023 0822   HDL 102 03/31/2023 0822   CHOLHDL 1.5 03/31/2023 0822   LDLCALC 41 03/31/2023 0822   LABVLDL 8 03/31/2023 0822     RADIOLOGY: No results found.   Additional studies/ records that were reviewed today include:  I reviewed the records of Dr. Pete Glatter at [place].  I reviewed the records of Dr. Everardo All and ER evaluation.  ECHO: 12/14/2020 IMPRESSIONS   1.  Left ventricular ejection fraction, by estimation, is 55 to 60%. Left  ventricular ejection fraction by 3D volume is 56 %. The left ventricle has  normal function. The left ventricle has no regional wall motion  abnormalities. Left ventricular diastolic   parameters were normal. The average left ventricular global longitudinal  strain is -21.4 %. The global longitudinal strain is normal.   2. Right ventricular systolic function is normal. The right ventricular  size is normal. There is normal pulmonary artery systolic pressure. The  estimated right ventricular systolic pressure is 28.6 mmHg.   3. The mitral valve is normal in structure. Trivial mitral valve  regurgitation. No evidence of mitral stenosis.   4. The aortic valve is tricuspid. Aortic valve regurgitation is not  visualized. Mild aortic valve sclerosis is present, with no evidence of  aortic valve stenosis.   5. The inferior vena cava is normal in size with greater than 50%  respiratory variability, suggesting right atrial pressure of 3 mmHg.   ECHO: 07/24/2021  1. Left ventricular ejection fraction, by estimation, is 60 to 65%. Left  ventricular ejection fraction by 2D MOD biplane is 61.5 %. The left  ventricle has normal function. The left ventricle has no regional wall  motion abnormalities. Left ventricular  diastolic parameters are consistent with Grade I diastolic dysfunction  (impaired relaxation).   2. Right ventricular systolic function is normal. The right ventricular  size is normal. There is normal pulmonary artery systolic pressure. The  estimated right ventricular systolic pressure is 19.3 mmHg.   3. The mitral valve is abnormal. Trivial mitral valve regurgitation.   4. The aortic valve is tricuspid. Aortic valve regurgitation is not  visualized. No aortic stenosis is present.  5. The inferior vena cava is normal in size with greater than 50%  respiratory variability, suggesting right atrial pressure of 3 mmHg.    ASSESSMENT:    1. Essential hypertension   2. Pure hypercholesterolemia   3. Coronary artery calcification   4. Graves disease: s/p radioactive iodine November 14, 2020   5. Postablative hypothyroidism   6. Osteoporosis, unspecified osteoporosis type, unspecified pathological fracture presence     PLAN:  Sara Carson is a very pleasant 78 year old female who developed sudden onset of hyperthyroidism/Graves' disease in May 2022 which she temporally relates to approximately 5 days after getting her second COVID booster shot.  At initial presentation she began to notice significant increase in heart rate as weakness.  She was started on methimazole on September 19, 2020.  She subsequently presented to [facility] ER at [facility] on October 12, 2020 with generalized weakness and weight loss of 15 pounds over the prior month.   She developed diffuse itching of her skin and it was felt that this may have been related to her methimazole.  She subsequently underwent treatment at [facility] and received radioactive iodine therapy on November 14, 2020.  Her free T4 levels had slowly decreased  to1.7.  On her thyroid scan she was noted to have mild coronary calcification early in June she had an echo Doppler study at [facility] which was interpreted by Dr. Oswaldo Milian.  She was found to have normal systolic and diastolic function with EF at 62%.  There was moderate left atrial dilatation as well as mild RA dilatation.  RV was felt to be borderline dilated.  Her mitral valve is structurally normal although there was mild MR.  She was estimated to have possible moderate pulmonary hypertension.  She was started on atenolol 25 mg twice a day with significant improvement in her palpitations and reduction in her heart rate.  I recommended she undergo follow-up echo Doppler study for reassessment after treatment of her Graves' disease for further evaluation of her previously detected pulmonary hypertension which was done on January 14, 2021 .  Her echo from July 24, 2021 showed normal systolic and diastolic function with EF at 65 to 60%; there was no evidence for any atrial enlargement.  Pulmonary artery systolic and right ventricular systolic pressure was normal at 28.6.  There was evidence for mild aortic sclerosis without stenosis.  There was trivial mitral regurgitation.  A a prior office visit I suggested slight reduction of atenolol to 25 mg in the morning and 12.5 mg at night.  She subsequently developed increased TSH following her ablation and was started on levothyroxine at initial dose of 88 mcg at [facility].  At her evaluation in February 2023 TSH was again over suppressed.  She was on levothyroxine 75 mg 6 days a week and 37.5 mg daily.  When seen in May 2023, her most recent TSH was in the normal range at 0.713.  Her blood pressure was stable.  She was no longer experiencing palpitations and I suggested slight additional further reduction of atenolol down to 12.5 mg every other day.  Apparently, she recently has had issues with severe diarrhea and was started on cholestyramine.  She was seen by Fredia Sorrow, NP at [facility] and laboratory from November 25, 2021  showed significant TSH elevation at 43.78 and T4 was low at 0.6.  She has continued to be on her same regimen of levothyroxine 75 mg 6 days a week and  37.5 mg daily.  Although I did not want to make any significant major change I did suggest that she may should at least contact Fredia Sorrow to see if in fact she should at least increase her levothyroxine to 75 mg daily with plans for her TSH which is scheduled to be rechecked in 1 month.  I discussed potential hypothyroidism also further contributing to hyperlipidemia.   At her office visit in August 2023 I reviewed her coronary calcium score which was elevated at 83.7 with calcification currently entirely in the LAD.  I discussed initiation of therapy but she wished to defer at least for recheck.   Her most recent LDL cholesterol in November was now 70.  Her LP(a) was normal at 45.6.  Consequently, at her December 2023 for evaluation I added low-dose rosuvastatin at 10 mg.  Subsequent laboratory showed significant benefit with LDL cholesterol decreasing down to 33.  Total cholesterol was 144, HDL 100, and triglycerides 45.  She continues to be euthyroid on her present dose of levothyroxine 75 mcg.  Since her last visit she has been tapered off atenolol.  She continues to take low-dose rosuvastatin with excellent lipid studies.  She continues to exercise.  Her blood pressure today when checked by me was 130/78.  I discussed with her my plans for upcoming retirement.  I will transition her to our [facility] cardiologists and will schedule her to see Dr. Jodelle Red in 6 to 8 months for follow-up evaluation.   Medication Adjustments/Labs and Tests Ordered: Current medicines are reviewed at length with the patient today.  Concerns regarding medicines are outlined above.  Medication changes, Labs and Tests ordered today are listed in the Patient Instructions below. Patient Instructions  Medication Instructions:  No medication changes were made during today's visit.  *If you need a refill on your cardiac medications before your next appointment, please call your pharmacy*   Lab Work: No labs were ordered during today's visit.  If you have labs (blood work) drawn today and your tests are completely normal, you will receive your results only by: MyChart Message (if you have MyChart) OR A paper copy in the mail If you have any lab test that is abnormal or we need to change your treatment, we will call you to review the results.   Testing/Procedures: No procedures were ordered during today's visit.    Follow-Up: At [facility], you and your health needs are our priority.  As part of our continuing mission to provide you with exceptional heart care, we have created  designated Provider Care Teams.  These Care Teams include your primary Cardiologist (physician) and Advanced Practice Providers (APPs -  Physician Assistants and Nurse Practitioners) who all work together to provide you with the care you need, when you need it.  We recommend signing up for the patient portal called "MyChart".  Sign up information is provided on this After Visit Summary.  MyChart is used to connect with patients for Virtual Visits (Telemedicine).  Patients are able to view lab/test results, encounter notes, upcoming appointments, etc.  Non-urgent messages can be sent to your provider as well.   To learn more about what you can do with MyChart, go to [email or web address].    Your next appointment:   8 month(s)  Provider:   Dr Jodelle Red     Other Instructions Thank you for choosing [facility]!       Signed, Nicki Guadalajara, MD  07/17/2023 9:55 AM  [facility] [address], [place], [place]  [address] Phone: [phone]

## 2023-07-17 ENCOUNTER — Encounter: Payer: Self-pay | Admitting: Cardiovascular Disease

## 2023-07-23 DIAGNOSIS — Z23 Encounter for immunization: Secondary | ICD-10-CM | POA: Diagnosis not present

## 2023-09-07 DIAGNOSIS — E890 Postprocedural hypothyroidism: Secondary | ICD-10-CM | POA: Diagnosis not present

## 2023-09-07 DIAGNOSIS — R739 Hyperglycemia, unspecified: Secondary | ICD-10-CM | POA: Diagnosis not present

## 2023-09-22 DIAGNOSIS — Z0000 Encounter for general adult medical examination without abnormal findings: Secondary | ICD-10-CM | POA: Diagnosis not present

## 2023-09-22 DIAGNOSIS — F3340 Major depressive disorder, recurrent, in remission, unspecified: Secondary | ICD-10-CM | POA: Diagnosis not present

## 2023-09-22 DIAGNOSIS — E0590 Thyrotoxicosis, unspecified without thyrotoxic crisis or storm: Secondary | ICD-10-CM | POA: Diagnosis not present

## 2023-09-22 DIAGNOSIS — R7982 Elevated C-reactive protein (CRP): Secondary | ICD-10-CM | POA: Diagnosis not present

## 2023-09-22 DIAGNOSIS — K529 Noninfective gastroenteritis and colitis, unspecified: Secondary | ICD-10-CM | POA: Diagnosis not present

## 2023-09-22 DIAGNOSIS — D508 Other iron deficiency anemias: Secondary | ICD-10-CM | POA: Diagnosis not present

## 2023-09-22 DIAGNOSIS — H93A9 Pulsatile tinnitus, unspecified ear: Secondary | ICD-10-CM | POA: Diagnosis not present

## 2023-09-22 DIAGNOSIS — E7849 Other hyperlipidemia: Secondary | ICD-10-CM | POA: Diagnosis not present

## 2023-09-22 DIAGNOSIS — Z1331 Encounter for screening for depression: Secondary | ICD-10-CM | POA: Diagnosis not present

## 2023-09-22 DIAGNOSIS — Z79899 Other long term (current) drug therapy: Secondary | ICD-10-CM | POA: Diagnosis not present

## 2023-09-22 DIAGNOSIS — R7303 Prediabetes: Secondary | ICD-10-CM | POA: Diagnosis not present

## 2023-09-22 DIAGNOSIS — M8589 Other specified disorders of bone density and structure, multiple sites: Secondary | ICD-10-CM | POA: Diagnosis not present

## 2023-09-22 DIAGNOSIS — K219 Gastro-esophageal reflux disease without esophagitis: Secondary | ICD-10-CM | POA: Diagnosis not present

## 2023-09-24 DIAGNOSIS — R7303 Prediabetes: Secondary | ICD-10-CM | POA: Diagnosis not present

## 2023-09-24 DIAGNOSIS — E890 Postprocedural hypothyroidism: Secondary | ICD-10-CM | POA: Diagnosis not present

## 2023-10-01 ENCOUNTER — Encounter: Payer: Self-pay | Admitting: Cardiovascular Disease

## 2023-10-01 MED ORDER — ROSUVASTATIN CALCIUM 10 MG PO TABS: 10.0000 mg | ORAL_TABLET | Freq: Every day | ORAL | 3 refills | Status: AC

## 2023-10-05 DIAGNOSIS — M8588 Other specified disorders of bone density and structure, other site: Secondary | ICD-10-CM | POA: Diagnosis not present

## 2023-10-05 DIAGNOSIS — E0590 Thyrotoxicosis, unspecified without thyrotoxic crisis or storm: Secondary | ICD-10-CM | POA: Diagnosis not present

## 2023-12-07 DIAGNOSIS — H2513 Age-related nuclear cataract, bilateral: Secondary | ICD-10-CM | POA: Diagnosis not present

## 2023-12-07 DIAGNOSIS — H04123 Dry eye syndrome of bilateral lacrimal glands: Secondary | ICD-10-CM | POA: Diagnosis not present

## 2024-01-11 DIAGNOSIS — Z23 Encounter for immunization: Secondary | ICD-10-CM | POA: Diagnosis not present

## 2024-01-14 DIAGNOSIS — Z23 Encounter for immunization: Secondary | ICD-10-CM | POA: Diagnosis not present

## 2024-03-11 ENCOUNTER — Ambulatory Visit (HOSPITAL_BASED_OUTPATIENT_CLINIC_OR_DEPARTMENT_OTHER): Admitting: Cardiology

## 2024-03-11 ENCOUNTER — Encounter (HOSPITAL_BASED_OUTPATIENT_CLINIC_OR_DEPARTMENT_OTHER): Payer: Self-pay | Admitting: Cardiology

## 2024-03-11 VITALS — BP 124/68 | HR 70 | Ht 62.5 in | Wt 126.1 lb

## 2024-03-11 DIAGNOSIS — Z712 Person consulting for explanation of examination or test findings: Secondary | ICD-10-CM | POA: Diagnosis not present

## 2024-03-11 DIAGNOSIS — I2510 Atherosclerotic heart disease of native coronary artery without angina pectoris: Secondary | ICD-10-CM | POA: Diagnosis not present

## 2024-03-11 DIAGNOSIS — E7800 Pure hypercholesterolemia, unspecified: Secondary | ICD-10-CM

## 2024-03-11 DIAGNOSIS — Z7189 Other specified counseling: Secondary | ICD-10-CM | POA: Diagnosis not present

## 2024-03-11 DIAGNOSIS — I10 Essential (primary) hypertension: Secondary | ICD-10-CM

## 2024-03-11 NOTE — Progress Notes (Signed)
 Cardiology Office Note:  .   Date:  03/11/2024  ID:  Sara Carson, DOB 1945/04/26, MRN [id number] PCP: Sara Trula SQUIBB, MD  [facility] Providers Cardiologist:  Shelda Bruckner, MD {  History of Present Illness: .   Sara Carson is a 78 y.o. 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 She previously followed with Dr. Burnard and established care with me on 03/11/24.  Pertinent CV history: Ca score 09/2021 was 83.7 (57th %ile). Most recent echo 07/25/2021 with EF 60-65%, G1DD, normal RV with RVSP 19, RAP 3. No significant valve disease.  Today: Brings a list of questions today. Asking if she needs to continue to follow with cardiology. Has pulsatile tinnitus, unilateral, not all the time. Asking about medications, see below. Asking about LDL, repeat calcium  score. Asking about statin and blood sugar. Asking about blood pressure.   She exercises regularly, has been vegetarian since the 1980s.  Has intermittent palpitations for 5-10 seconds. Unchanged chronically.   ROS: Denies chest pain, shortness of breath at rest or with normal exertion. No PND, orthopnea, LE edema or unexpected weight gain. No syncope. ROS otherwise negative except as noted.   Studies Reviewed: Sara Carson    EKG:       Physical Exam:   VS:  BP 124/68   Pulse 70   Ht 5' 2.5 (1.588 m)   Wt 126 lb 1.6 oz (57.2 kg)   SpO2 98%   BMI 22.70 kg/m    Wt Readings from Last 3 Encounters:  03/11/24 126 lb 1.6 oz (57.2 kg)  07/15/23 125 lb 12.8 oz (57.1 kg)  10/15/22 123 lb (55.8 kg)    GEN: Well nourished, well developed in no acute distress HEENT: Normal, moist mucous membranes NECK: No JVD CARDIAC: regular rhythm, normal S1 and S2, no rubs or gallops. No murmur. VASCULAR: Radial and DP pulses 2+ bilaterally. No carotid bruits RESPIRATORY:  Clear to auscultation without rales, wheezing or rhonchi  ABDOMEN: Soft, non-tender,  non-distended MUSCULOSKELETAL:  Ambulates independently SKIN: Warm and dry, no edema NEUROLOGIC:  Alert and oriented x 3. No focal neuro deficits noted. PSYCHIATRIC:  Normal affect    ASSESSMENT AND PLAN: .    Nonobstructive CAD by calcium  score Hypercholesterolemia -lpa normal -Ca score as above, reviewed her test results and recommendations today -LDL goal <70, most recent 26 from 05/27/23 (Care Everywhere) -continue rosuvastatin  10 mg daily, we discussed -on aspirin  81 mg daily but bruises easily. We discussed pros/cons today. She is currently taking every other day. She will continue this for now but we discussed watching for worsening symptoms -reviewed red flag warning signs that need immediate medical attention  Hypertension: no longer on blood pressure medication, at goal. Watches salt. Can go up with stress but generally well controlled. No indication for medication at this time.  Palpitations: chronic, brief, intermittent. Symptoms stable. She will contact us  if this worsens.  CV risk counseling and prevention -recommend heart healthy/Mediterranean diet, with whole grains, fruits, vegetable, nuts, and olive oil. Limit salt. -recommend moderate walking, 3-5 times/week for 30-50 minutes each session. Aim for at least 150 minutes/week. Goal should be pace of 3 miles/hours, or walking 1.5 miles in 30 minutes -recommend avoidance of tobacco products. Avoid excess alcohol.  Dispo: I would be happy to see her back as needed  Signed, Shelda Bruckner, MD   Shelda Bruckner, MD, PhD, [facility] [place]  [facility]  [place]  [facility] at [facility] at [facility] [address], [address] [place], [place] [address] [phone]

## 2024-03-11 NOTE — Patient Instructions (Signed)
